# Patient Record
Sex: Female | Born: 1945
Health system: Southern US, Community
[De-identification: ages and names within clinical notes are randomized; demographics above are authoritative.]

## PROBLEM LIST (undated history)

## (undated) DIAGNOSIS — M8580 Other specified disorders of bone density and structure, unspecified site: Secondary | ICD-10-CM

## (undated) DIAGNOSIS — R112 Nausea with vomiting, unspecified: Secondary | ICD-10-CM

## (undated) DIAGNOSIS — C801 Malignant (primary) neoplasm, unspecified: Secondary | ICD-10-CM

## (undated) DIAGNOSIS — Z9889 Other specified postprocedural states: Secondary | ICD-10-CM

## (undated) DIAGNOSIS — IMO0002 Reserved for concepts with insufficient information to code with codable children: Secondary | ICD-10-CM

## (undated) DIAGNOSIS — Z9289 Personal history of other medical treatment: Secondary | ICD-10-CM

## (undated) DIAGNOSIS — R600 Localized edema: Secondary | ICD-10-CM

## (undated) DIAGNOSIS — D649 Anemia, unspecified: Secondary | ICD-10-CM

## (undated) DIAGNOSIS — T8859XA Other complications of anesthesia, initial encounter: Secondary | ICD-10-CM

## (undated) DIAGNOSIS — Z8489 Family history of other specified conditions: Secondary | ICD-10-CM

## (undated) DIAGNOSIS — R51 Headache: Secondary | ICD-10-CM

## (undated) DIAGNOSIS — Z460 Encounter for fitting and adjustment of spectacles and contact lenses: Secondary | ICD-10-CM

## (undated) DIAGNOSIS — M359 Systemic involvement of connective tissue, unspecified: Secondary | ICD-10-CM

## (undated) DIAGNOSIS — T4145XA Adverse effect of unspecified anesthetic, initial encounter: Secondary | ICD-10-CM

## (undated) DIAGNOSIS — G629 Polyneuropathy, unspecified: Secondary | ICD-10-CM

## (undated) DIAGNOSIS — E039 Hypothyroidism, unspecified: Secondary | ICD-10-CM

## (undated) DIAGNOSIS — I890 Lymphedema, not elsewhere classified: Secondary | ICD-10-CM

## (undated) DIAGNOSIS — M1990 Unspecified osteoarthritis, unspecified site: Secondary | ICD-10-CM

## (undated) HISTORY — DX: Systemic involvement of connective tissue, unspecified: M35.9

## (undated) HISTORY — DX: Lymphedema, not elsewhere classified: I89.0

## (undated) HISTORY — PX: BACK SURGERY: SHX140

## (undated) HISTORY — PX: OTHER SURGICAL HISTORY: SHX169

## (undated) HISTORY — PX: APPENDECTOMY: SHX54

## (undated) HISTORY — DX: Polyneuropathy, unspecified: G62.9

## (undated) HISTORY — PX: EYE SURGERY: SHX253

## (undated) HISTORY — PX: KNEE ARTHROSCOPY: SUR90

## (undated) HISTORY — PX: TUBAL LIGATION: SHX77

## (undated) HISTORY — DX: Malignant (primary) neoplasm, unspecified: C80.1

## (undated) HISTORY — PX: TONSILLECTOMY AND ADENOIDECTOMY: SUR1326

## (undated) HISTORY — PX: HAMMER TOE SURGERY: SHX385

## (undated) HISTORY — DX: Other specified disorders of bone density and structure, unspecified site: M85.80

## (undated) HISTORY — DX: Unspecified osteoarthritis, unspecified site: M19.90

## (undated) HISTORY — PX: TOTAL SHOULDER REPLACEMENT: SUR1217

## (undated) HISTORY — PX: TOTAL KNEE ARTHROPLASTY: SHX125

## (undated) HISTORY — DX: Reserved for concepts with insufficient information to code with codable children: IMO0002

---

## 1976-10-04 HISTORY — PX: BREAST ENHANCEMENT SURGERY: SHX7

## 1984-10-04 HISTORY — PX: THYROIDECTOMY: SHX17

## 1984-10-04 HISTORY — PX: VAGINAL HYSTERECTOMY: SUR661

## 1993-10-04 DIAGNOSIS — IMO0002 Reserved for concepts with insufficient information to code with codable children: Secondary | ICD-10-CM

## 1993-10-04 HISTORY — DX: Reserved for concepts with insufficient information to code with codable children: IMO0002

## 1998-07-11 ENCOUNTER — Ambulatory Visit (HOSPITAL_COMMUNITY): Admission: RE | Admit: 1998-07-11 | Discharge: 1998-07-11 | Payer: Self-pay | Admitting: Orthopedic Surgery

## 1998-07-11 ENCOUNTER — Encounter: Payer: Self-pay | Admitting: Orthopedic Surgery

## 1998-08-01 ENCOUNTER — Ambulatory Visit (HOSPITAL_BASED_OUTPATIENT_CLINIC_OR_DEPARTMENT_OTHER): Admission: RE | Admit: 1998-08-01 | Discharge: 1998-08-01 | Payer: Self-pay | Admitting: Orthopedic Surgery

## 1999-05-05 ENCOUNTER — Encounter: Payer: Self-pay | Admitting: Orthopedic Surgery

## 1999-05-12 ENCOUNTER — Inpatient Hospital Stay (HOSPITAL_COMMUNITY): Admission: RE | Admit: 1999-05-12 | Discharge: 1999-05-20 | Payer: Self-pay | Admitting: Orthopedic Surgery

## 1999-10-27 ENCOUNTER — Encounter: Payer: Self-pay | Admitting: Obstetrics and Gynecology

## 1999-10-27 ENCOUNTER — Encounter: Admission: RE | Admit: 1999-10-27 | Discharge: 1999-10-27 | Payer: Self-pay | Admitting: Obstetrics and Gynecology

## 2000-10-31 ENCOUNTER — Encounter: Admission: RE | Admit: 2000-10-31 | Discharge: 2000-10-31 | Payer: Self-pay | Admitting: Obstetrics and Gynecology

## 2000-10-31 ENCOUNTER — Encounter: Payer: Self-pay | Admitting: Obstetrics and Gynecology

## 2001-12-07 ENCOUNTER — Encounter: Admission: RE | Admit: 2001-12-07 | Discharge: 2001-12-07 | Payer: Self-pay | Admitting: Obstetrics and Gynecology

## 2001-12-07 ENCOUNTER — Encounter: Payer: Self-pay | Admitting: Obstetrics and Gynecology

## 2002-12-31 ENCOUNTER — Encounter: Payer: Self-pay | Admitting: Anesthesiology

## 2002-12-31 ENCOUNTER — Ambulatory Visit (HOSPITAL_COMMUNITY): Admission: RE | Admit: 2002-12-31 | Discharge: 2002-12-31 | Payer: Self-pay | Admitting: Anesthesiology

## 2003-02-15 ENCOUNTER — Encounter: Payer: Self-pay | Admitting: Neurological Surgery

## 2003-02-15 ENCOUNTER — Ambulatory Visit (HOSPITAL_COMMUNITY): Admission: RE | Admit: 2003-02-15 | Discharge: 2003-02-15 | Payer: Self-pay | Admitting: Neurological Surgery

## 2003-03-05 ENCOUNTER — Inpatient Hospital Stay (HOSPITAL_COMMUNITY): Admission: RE | Admit: 2003-03-05 | Discharge: 2003-03-08 | Payer: Self-pay | Admitting: Neurological Surgery

## 2003-03-05 ENCOUNTER — Encounter: Payer: Self-pay | Admitting: Neurological Surgery

## 2003-03-07 ENCOUNTER — Encounter: Payer: Self-pay | Admitting: Neurological Surgery

## 2003-05-01 ENCOUNTER — Encounter: Payer: Self-pay | Admitting: Obstetrics and Gynecology

## 2003-05-01 ENCOUNTER — Encounter: Admission: RE | Admit: 2003-05-01 | Discharge: 2003-05-01 | Payer: Self-pay | Admitting: Obstetrics and Gynecology

## 2003-10-21 ENCOUNTER — Ambulatory Visit (HOSPITAL_COMMUNITY): Admission: RE | Admit: 2003-10-21 | Discharge: 2003-10-21 | Payer: Self-pay | Admitting: Anesthesiology

## 2004-05-13 ENCOUNTER — Emergency Department (HOSPITAL_COMMUNITY): Admission: EM | Admit: 2004-05-13 | Discharge: 2004-05-13 | Payer: Self-pay | Admitting: Emergency Medicine

## 2004-05-22 ENCOUNTER — Encounter: Admission: RE | Admit: 2004-05-22 | Discharge: 2004-05-22 | Payer: Self-pay | Admitting: Obstetrics and Gynecology

## 2004-05-28 ENCOUNTER — Ambulatory Visit (HOSPITAL_COMMUNITY): Admission: RE | Admit: 2004-05-28 | Discharge: 2004-05-28 | Payer: Self-pay | Admitting: Anesthesiology

## 2004-12-22 ENCOUNTER — Emergency Department (HOSPITAL_COMMUNITY): Admission: EM | Admit: 2004-12-22 | Discharge: 2004-12-22 | Payer: Self-pay | Admitting: Emergency Medicine

## 2005-05-10 ENCOUNTER — Emergency Department: Payer: Self-pay | Admitting: Emergency Medicine

## 2005-07-28 ENCOUNTER — Encounter: Admission: RE | Admit: 2005-07-28 | Discharge: 2005-07-28 | Payer: Self-pay | Admitting: Obstetrics and Gynecology

## 2005-11-17 ENCOUNTER — Emergency Department (HOSPITAL_COMMUNITY): Admission: EM | Admit: 2005-11-17 | Discharge: 2005-11-17 | Payer: Self-pay | Admitting: Emergency Medicine

## 2005-12-20 ENCOUNTER — Ambulatory Visit (HOSPITAL_COMMUNITY): Admission: RE | Admit: 2005-12-20 | Discharge: 2005-12-20 | Payer: Self-pay | Admitting: Neurological Surgery

## 2006-06-27 ENCOUNTER — Inpatient Hospital Stay (HOSPITAL_COMMUNITY): Admission: RE | Admit: 2006-06-27 | Discharge: 2006-07-05 | Payer: Self-pay | Admitting: Neurological Surgery

## 2006-07-01 ENCOUNTER — Ambulatory Visit: Payer: Self-pay | Admitting: Physical Medicine & Rehabilitation

## 2006-11-02 ENCOUNTER — Inpatient Hospital Stay (HOSPITAL_COMMUNITY): Admission: RE | Admit: 2006-11-02 | Discharge: 2006-11-09 | Payer: Self-pay | Admitting: Neurological Surgery

## 2006-11-02 ENCOUNTER — Encounter: Payer: Self-pay | Admitting: Neurological Surgery

## 2007-01-03 ENCOUNTER — Encounter: Payer: Self-pay | Admitting: Neurological Surgery

## 2007-02-02 ENCOUNTER — Encounter: Payer: Self-pay | Admitting: Neurological Surgery

## 2007-06-22 ENCOUNTER — Encounter: Admission: RE | Admit: 2007-06-22 | Discharge: 2007-06-22 | Payer: Self-pay | Admitting: Obstetrics and Gynecology

## 2007-08-07 ENCOUNTER — Ambulatory Visit (HOSPITAL_COMMUNITY): Admission: RE | Admit: 2007-08-07 | Discharge: 2007-08-07 | Payer: Self-pay | Admitting: Anesthesiology

## 2007-10-02 ENCOUNTER — Ambulatory Visit: Payer: Self-pay | Admitting: Gastroenterology

## 2007-11-17 ENCOUNTER — Ambulatory Visit (HOSPITAL_BASED_OUTPATIENT_CLINIC_OR_DEPARTMENT_OTHER): Admission: RE | Admit: 2007-11-17 | Discharge: 2007-11-17 | Payer: Self-pay | Admitting: Orthopedic Surgery

## 2008-04-14 ENCOUNTER — Encounter: Admission: RE | Admit: 2008-04-14 | Discharge: 2008-04-14 | Payer: Self-pay | Admitting: Orthopaedic Surgery

## 2008-07-26 ENCOUNTER — Encounter: Admission: RE | Admit: 2008-07-26 | Discharge: 2008-07-26 | Payer: Self-pay | Admitting: Orthopedic Surgery

## 2008-08-20 ENCOUNTER — Encounter (INDEPENDENT_AMBULATORY_CARE_PROVIDER_SITE_OTHER): Payer: Self-pay | Admitting: Orthopedic Surgery

## 2008-08-20 ENCOUNTER — Ambulatory Visit: Payer: Self-pay | Admitting: Pulmonary Disease

## 2008-08-20 ENCOUNTER — Inpatient Hospital Stay (HOSPITAL_COMMUNITY): Admission: RE | Admit: 2008-08-20 | Discharge: 2008-08-28 | Payer: Self-pay | Admitting: Orthopedic Surgery

## 2008-08-27 ENCOUNTER — Ambulatory Visit: Payer: Self-pay | Admitting: Physical Medicine & Rehabilitation

## 2008-08-28 ENCOUNTER — Inpatient Hospital Stay (HOSPITAL_COMMUNITY)
Admission: RE | Admit: 2008-08-28 | Discharge: 2008-09-04 | Payer: Self-pay | Admitting: Physical Medicine & Rehabilitation

## 2008-11-21 ENCOUNTER — Encounter: Admission: RE | Admit: 2008-11-21 | Discharge: 2008-11-21 | Payer: Self-pay | Admitting: Obstetrics and Gynecology

## 2008-11-27 ENCOUNTER — Encounter: Admission: RE | Admit: 2008-11-27 | Discharge: 2008-11-27 | Payer: Self-pay | Admitting: Obstetrics and Gynecology

## 2009-08-12 ENCOUNTER — Encounter: Admission: RE | Admit: 2009-08-12 | Discharge: 2009-08-12 | Payer: Self-pay | Admitting: Orthopedic Surgery

## 2009-09-23 ENCOUNTER — Ambulatory Visit (HOSPITAL_COMMUNITY): Admission: RE | Admit: 2009-09-23 | Discharge: 2009-09-23 | Payer: Self-pay | Admitting: Orthopedic Surgery

## 2009-10-01 ENCOUNTER — Inpatient Hospital Stay (HOSPITAL_COMMUNITY): Admission: AD | Admit: 2009-10-01 | Discharge: 2009-10-03 | Payer: Self-pay | Admitting: Orthopedic Surgery

## 2009-10-04 HISTORY — PX: SPINAL CORD STIMULATOR IMPLANT: SHX2422

## 2010-01-01 ENCOUNTER — Encounter
Admission: RE | Admit: 2010-01-01 | Discharge: 2010-01-01 | Payer: Self-pay | Source: Home / Self Care | Admitting: Obstetrics and Gynecology

## 2010-10-15 ENCOUNTER — Ambulatory Visit (HOSPITAL_COMMUNITY)
Admission: RE | Admit: 2010-10-15 | Discharge: 2010-10-15 | Payer: Self-pay | Source: Home / Self Care | Attending: Orthopedic Surgery | Admitting: Orthopedic Surgery

## 2010-10-25 ENCOUNTER — Encounter: Payer: Self-pay | Admitting: Orthopedic Surgery

## 2010-10-25 ENCOUNTER — Encounter: Payer: Self-pay | Admitting: Neurological Surgery

## 2010-11-01 LAB — HM COLONOSCOPY: HM Colonoscopy: NORMAL

## 2011-01-04 LAB — DIFFERENTIAL
Basophils Absolute: 0 10*3/uL (ref 0.0–0.1)
Basophils Absolute: 0 10*3/uL (ref 0.0–0.1)
Basophils Relative: 0 % (ref 0–1)
Basophils Relative: 1 % (ref 0–1)
Eosinophils Absolute: 0 10*3/uL (ref 0.0–0.7)
Eosinophils Absolute: 0.1 10*3/uL (ref 0.0–0.7)
Eosinophils Relative: 1 % (ref 0–5)
Eosinophils Relative: 1 % (ref 0–5)
Lymphocytes Relative: 24 % (ref 12–46)
Lymphocytes Relative: 25 % (ref 12–46)
Lymphs Abs: 1.1 10*3/uL (ref 0.7–4.0)
Lymphs Abs: 1.1 10*3/uL (ref 0.7–4.0)
Monocytes Absolute: 0.3 10*3/uL (ref 0.1–1.0)
Monocytes Absolute: 0.3 10*3/uL (ref 0.1–1.0)
Monocytes Relative: 6 % (ref 3–12)
Monocytes Relative: 8 % (ref 3–12)
Neutro Abs: 2.9 10*3/uL (ref 1.7–7.7)
Neutro Abs: 3 10*3/uL (ref 1.7–7.7)
Neutrophils Relative %: 66 % (ref 43–77)
Neutrophils Relative %: 68 % (ref 43–77)

## 2011-01-04 LAB — URINALYSIS, ROUTINE W REFLEX MICROSCOPIC
Bilirubin Urine: NEGATIVE
Bilirubin Urine: NEGATIVE
Glucose, UA: NEGATIVE mg/dL
Glucose, UA: NEGATIVE mg/dL
Hgb urine dipstick: NEGATIVE
Hgb urine dipstick: NEGATIVE
Ketones, ur: NEGATIVE mg/dL
Ketones, ur: NEGATIVE mg/dL
Nitrite: NEGATIVE
Nitrite: NEGATIVE
Protein, ur: NEGATIVE mg/dL
Protein, ur: NEGATIVE mg/dL
Specific Gravity, Urine: 1.018 (ref 1.005–1.030)
Specific Gravity, Urine: 1.019 (ref 1.005–1.030)
Urobilinogen, UA: 0.2 mg/dL (ref 0.0–1.0)
Urobilinogen, UA: 0.2 mg/dL (ref 0.0–1.0)
pH: 5.5 (ref 5.0–8.0)
pH: 6 (ref 5.0–8.0)

## 2011-01-04 LAB — COMPREHENSIVE METABOLIC PANEL
ALT: 15 U/L (ref 0–35)
ALT: 19 U/L (ref 0–35)
AST: 23 U/L (ref 0–37)
AST: 28 U/L (ref 0–37)
Albumin: 3.7 g/dL (ref 3.5–5.2)
Albumin: 4.2 g/dL (ref 3.5–5.2)
Alkaline Phosphatase: 57 U/L (ref 39–117)
Alkaline Phosphatase: 58 U/L (ref 39–117)
BUN: 11 mg/dL (ref 6–23)
BUN: 14 mg/dL (ref 6–23)
CO2: 29 mEq/L (ref 19–32)
CO2: 30 mEq/L (ref 19–32)
Calcium: 9.2 mg/dL (ref 8.4–10.5)
Calcium: 9.3 mg/dL (ref 8.4–10.5)
Chloride: 100 mEq/L (ref 96–112)
Chloride: 98 mEq/L (ref 96–112)
Creatinine, Ser: 0.6 mg/dL (ref 0.4–1.2)
Creatinine, Ser: 0.67 mg/dL (ref 0.4–1.2)
GFR calc Af Amer: >60 mL/min (ref >60)
GFR calc Af Amer: >60 mL/min (ref >60)
GFR calc non Af Amer: >60 mL/min (ref >60)
GFR calc non Af Amer: >60 mL/min (ref >60)
Glucose, Bld: 91 mg/dL (ref 70–99)
Glucose, Bld: 92 mg/dL (ref 70–99)
Potassium: 3.6 mEq/L (ref 3.5–5.1)
Potassium: 3.7 mEq/L (ref 3.5–5.1)
Sodium: 137 mEq/L (ref 135–145)
Sodium: 137 mEq/L (ref 135–145)
Total Bilirubin: 0.7 mg/dL (ref 0.3–1.2)
Total Bilirubin: 0.8 mg/dL (ref 0.3–1.2)
Total Protein: 6.4 g/dL (ref 6.0–8.3)
Total Protein: 6.9 g/dL (ref 6.0–8.3)

## 2011-01-04 LAB — TYPE AND SCREEN
ABO/RH(D): B POS
ABO/RH(D): B POS
Antibody Screen: NEGATIVE
Antibody Screen: NEGATIVE

## 2011-01-04 LAB — CBC
HCT: 36.5 % (ref 36.0–46.0)
HCT: 38.6 % (ref 36.0–46.0)
Hemoglobin: 12.5 g/dL (ref 12.0–15.0)
Hemoglobin: 13.3 g/dL (ref 12.0–15.0)
MCHC: 34.3 g/dL (ref 30.0–36.0)
MCHC: 34.5 g/dL (ref 30.0–36.0)
MCV: 95.9 fL (ref 78.0–100.0)
MCV: 96.4 fL (ref 78.0–100.0)
Platelets: 162 10*3/uL (ref 150–400)
Platelets: 186 10*3/uL (ref 150–400)
RBC: 3.78 MIL/uL — ABNORMAL LOW (ref 3.87–5.11)
RBC: 4.03 MIL/uL (ref 3.87–5.11)
RDW: 12.6 % (ref 11.5–15.5)
RDW: 13.1 % (ref 11.5–15.5)
WBC: 4.3 10*3/uL (ref 4.0–10.5)
WBC: 4.5 10*3/uL (ref 4.0–10.5)

## 2011-01-04 LAB — APTT
aPTT: 32 seconds (ref 24–37)
aPTT: 33 seconds (ref 24–37)

## 2011-01-04 LAB — PROTIME-INR
INR: 0.98 (ref 0.00–1.49)
INR: 1.05 (ref 0.00–1.49)
Prothrombin Time: 12.9 seconds (ref 11.6–15.2)
Prothrombin Time: 13.6 seconds (ref 11.6–15.2)

## 2011-01-13 ENCOUNTER — Other Ambulatory Visit: Payer: Self-pay | Admitting: Obstetrics and Gynecology

## 2011-01-13 DIAGNOSIS — Z1231 Encounter for screening mammogram for malignant neoplasm of breast: Secondary | ICD-10-CM

## 2011-01-28 ENCOUNTER — Ambulatory Visit
Admission: RE | Admit: 2011-01-28 | Discharge: 2011-01-28 | Disposition: A | Discharge status: Home / Self Care | Payer: MEDICARE | Source: Ambulatory Visit | Attending: Obstetrics and Gynecology | Admitting: Obstetrics and Gynecology

## 2011-01-28 DIAGNOSIS — Z1231 Encounter for screening mammogram for malignant neoplasm of breast: Secondary | ICD-10-CM

## 2011-01-29 ENCOUNTER — Other Ambulatory Visit: Payer: Self-pay | Admitting: Obstetrics and Gynecology

## 2011-01-29 DIAGNOSIS — R928 Other abnormal and inconclusive findings on diagnostic imaging of breast: Secondary | ICD-10-CM

## 2011-02-04 ENCOUNTER — Ambulatory Visit
Admission: RE | Admit: 2011-02-04 | Discharge: 2011-02-04 | Disposition: A | Discharge status: Home / Self Care | Payer: MEDICARE | Source: Ambulatory Visit | Attending: Obstetrics and Gynecology | Admitting: Obstetrics and Gynecology

## 2011-02-04 DIAGNOSIS — R928 Other abnormal and inconclusive findings on diagnostic imaging of breast: Secondary | ICD-10-CM

## 2011-02-16 NOTE — Op Note (Signed)
Sonya Cordova, Sonya Cordova NO.:  [id number]   MEDICAL RECORD NO.:  [id number]          PATIENT TYPE:  INP   LOCATION:  2303                         FACILITY:  [facility]   PHYSICIAN:  Nelda Severe, MD      DATE OF BIRTH:  [birth year]   DATE OF PROCEDURE:  08/20/2008  DATE OF DISCHARGE:                               OPERATIVE REPORT   SURGEON:  Nelda Severe, MD   ASSISTANT:  Lianne Cure, PA-C   PREOPERATIVE DIAGNOSES:  Thoracolumbar scoliosis, lumbar kyphosis (flat  back), status post multiple surgeries for fusion and laminectomies.   POSTOPERATIVE DIAGNOSES:  Thoracolumbar scoliosis, lumbar kyphosis (flat  back), status post multiple surgeries for fusion and laminectomies.   OPERATIVE PROCEDURES:  1. L3 pedicle subtraction osteotomy.  2. Bilateral L2 and L4 laminectomies (in addition to L3 laminectomy      included in osteotomy)  3. Posterior osteotomy fusion mass.  4. Revision laminectomy at L5-S1.  5. T10-11, T11-12, and T12-L1 posterior fusion, L2-L4 posterolateral      fusion.  6. Pedicle screw instrumentation, T10 through S1 with screws and rods.  7. Removal of previously placed thoracolumbar pedicle screws and rods.  8. Neurophysiologic monitoring.   DESCRIPTION OF PROCEDURE:  The patient was placed under general  endotracheal anesthesia.  A gram of Ancef was administered intravenously  for prophylaxis.  Sequential compression devices were placed on both  lower extremities.  Scalp, upper extremity, and lower extremity  electrodes were attached for neurophysiologic monitoring.  A Foley  catheter was placed in the bladder.   The patient was then positioned on a Jackson frame with the hips  maximally extended.  The upper extremities were carefully positioned so  as to avoid hyperflexion and abduction of the shoulders and so as to  avoid hyperflexion of the elbows.  We had to place an extra thickness  foam on the chest rest to elevate the chest such that  the restricted  range of motion of the right upper extremity at the shoulder could be  accommodated insofar as ability to place her on the arm board without  stressing the shoulder is confirmed.   The previous midline incision was outlined with a skin marker.  The  thoracolumbar area was prepped with DuraPrep and draped in rectangular  fashion, and the drapes were secured with Ioban.   The previous thoracolumbar incision was excised elliptically, initially  just scoring the skin with a scalpel, followed by injection of  subcutaneous tissue with a mixture of 0.25% plain Marcaine and 1%  lidocaine with epinephrine.  We then excised the ellipse of skin.  Dissection was carried down onto the spinous processes of the upper  lumbar and lower thoracic spine, and the upper screws identified  bilaterally.  The instrumentation was then exposed bilaterally.  The  screws were uncoupled from the rods and the rods removed.  Initially, we  left the screws in place to mark the pedicle holes.  We then exposed the  lamina and transverse process of a vertebra proximal for the thoracic  fusion, which was T10.  This was done because we  were able to detect  motion at the T11-12 interval and actually I began to remove the screws  and the proximal screws at T11 somewhat loose.  However, they were left  in place for the time being.   Once we removed the screws, we further exposed the distal lumbar spine,  which was quite arduous because of dense scar.  Ultimately, we used a C-  arm fluoroscopy unit to identify the previously made pedicle holes at  L4, L5, and S1 bilaterally.  These holes were re-drilled and once we  have done that the screws were placed bilaterally at L4, L5, and on the  right side at S1.  Unfortunately at the left side S1, we were not able  to get any significant amount of bone purchase.  These were screw holes  which had previously been made, and screws previously explanted at  another  surgery.  Therefore, we were finding preexisting holes via  fluoroscopic guidance and with the need to remove some of the posterior  bone mass in order to find the holes.  These holes as noted were filled  with screws, after we had placed radiopaque markers and checked the  position with the fluoroscopy unit.  We also checked the position of  pedicles at T10 and made pedicle holes with AP fluoroscopic guidance.  Markers were placed.   Next, we placed pedicle screws bilaterally at T10 and then at the levels  at which the previous screws had been placed basically from T11, T12,  and L2 bilaterally.  The L1 pedicles had not been used to place screws  previously, presumably because the pedicles were extremely small in  diameter.   Therefore, at a certain point, we now had all the pedicle screws,  proximally and distally, for fixation.  We then began the process of  creating a wide laminectomy from the pedicle of L2 above to the pedicle  of L4 below.  The posterior lateral fusion mass at the intervening L3  level was osteotomized and removed.  The pedicles were removed  bilaterally.  There was a great deal of epidural bleeding and bone  bleeding, which was fairly continuous.  We controlled as much epidural  bleeding as possible using bipolar coagulation.  We then used an  osteotome to resect the remaining pedicle and upper endplate at L3  bilaterally.  The posterior vertebral body cortex was also removed,  using a combination of osteotome and rongeurs.  This was done starting  on the left and then moving to the right.  The dura was extremely thin  anteriorly and on the left, we noted an arachnoid bubble.  On the right,  there was an area where the dura was very thin and underlying __________  could be visualized, there was no spinal fluid leak at either point.   Finally, after we had removed satisfactory amount of bone posteriorly  including both the pedicles (the pedicle subtraction  osteotomy) the  lateral fluoroscopic view was taken and an osteotome was used to  penetrate anteriorly to a point a few millimeters from the anterior  cortex of the L3 vertebra.  With the fluoroscopy unit in place, the  pedicle screwdrivers were attached above and below the osteotomy and the  deformity (the lumbar flat back) was easily corrected.  The fluoroscopy  unit was then removed.  At this point, we began the process of rod  contouring reduction of the osteotomy as well as extending the lumbar  spine out of  the kyphotic deformity, we attempted to perform some  coronal plane correction to it.  The patient in a standing position was  decompensated to the right side.  Ultimately, we attached a 5.5 titanium  rod to the distal and proximal with the deformity satisfactorily  corrected inducing approximately 40 degrees of lumbar lordosis.  AP and  lateral portable radiographs were taken.  We took a second AP radiograph  positioned somewhat more proximally to try to judge the coronal plane  balance.  It was impossible to know for certain whether we had  adequately reduced the coronal plane deformity or in fact whether we had  slightly overcorrected it.  We were unable to get one x-ray cassette on  AP view from sacrum to T2 or T1.   During the process of laminectomy, we harvested bone as well as during  the process of osteotomy, we resected pieces of vertebral body, which  were harvested for bone graft.  Also, not mentioned above, is the fact  that I had to perform bilaterally exploration laminectomy at L5-S1 in  order to identify the pedicles of L5 and S1 and to help locate the S1  pedicles bilaterally and the left L5 pedicle, all of which were very  difficult to identify with the fluoroscopy views, although the  subsequent fluoroscopy views after we drilled the holes and placed  markers look satisfactory.   The bone graft was run through a mill and then mixed with 2 g of  vancomycin  powder.   Next, we decorticated the lamina at T10-T11, T11-T12, and T12-L1.  It  should be noted that there was not solid fusion at the T11-12 and T12-L1  levels from the previous surgery.  There was residual beta-tricalcium  phosphate particles remaining there and motion had been detected prior  to attaching the screws to rods.  Therefore, these laminae were  decorticated, and the facet joint denuded of articular cartilage.  We  then packed bone graft bilaterally at the T10-11, T11-12, and T12-L1  levels.   On the left side at L2, we decorticated the transverse process and the  fusion mass posterolaterally at the L4 level.  This interval was packed  with bone graft as well.  The bone was well coapted at the osteotomy  site, particularly on the left side.   All the couplings were then torqued.  Prior to placing the graft, we had  irrigated with antibiotic solution and the wound was numerous times  irrigated with antibiotic solution throughout the procedure, and the  solution sucked into a regular suction to avoid aspirating into the Cell  Saver.  We then closed the thoracolumbar fascia with numerous figure-of-  eight #1 Vicryl sutures in interrupted fashion.  A 1/8 inch Hemovac  drain was then placed in the subcutaneous layer and the subcutaneous  layer was closed using inverted 2-0 Vicryl sutures in interrupted  fashion.  The skin was closed using subcuticular 3-0 undyed Vicryl in  running fashion.  The skin edges were reinforced with Steri-Strips.  A 2-  0 nylon suture was used to secure a Hemovac drain, which was brought out  through the skin to the right side distally.  A nonadherent antibiotic  ointment dressing was applied and secured with OpSite.   There was a very significant blood loss throughout the procedure as  anticipated.  The final blood loss estimated to be between 7 and 8 L.  The patient received 4 units of fresh frozen plasma fairly early  in the  case, somewhere  around 1500 mL and 2 L of blood loss, later received  more fresh frozen plasma and platelet concentrate.  At no point, did she  appear to be having problems with the coagulopathy, and once the  osteotomy was closed, the bleeding did significantly reduce.   The patient's status insofar as a neurophysiologic monitoring was stable  throughout the case.  There was at one point when SSEPs of the upper  extremities were somewhat reduced and the circulating nurse under the  direction of the anesthetist taking arms of the arm boards and put the  shoulders to range of motion, which seemed to result and return of  normal readings.  Also, prior to attaching rods, every screw was  stimulated with electrical current and distal EMG activity recorded at  the time of stimulation.  In no instance was the current necessary to  stimulate the distal EMG activity at a low enough level to raise  concern.  In other words, based on the neurophysiologic testing, there  is little likelihood of any contact between the nerve root and the screw  thread.   Sponge and needle counts were correct.  The blood loss was as stated.  There were no intraoperative complications.   At the time of dictation, the patient has been transferred to the 2300  ICU, and I have not examined her yet, but will do so later.      Nelda Severe, MD  Electronically Signed     MT/MEDQ  D:  08/20/2008  T:  08/21/2008  Job:  [id number]

## 2011-02-16 NOTE — Op Note (Signed)
NAMEANGELIGUE, BOWNE             ACCOUNT NO.:  [id number]   MEDICAL RECORD NO.:  [id number]          PATIENT TYPE:  AMB   LOCATION:  NESC                         [facility]AC[facility]   PHYSICIAN:  Ollen Gross, M.D.    DATE OF BIRTH:  12/15/1945   DATE OF PROCEDURE:  11/17/2007  DATE OF DISCHARGE:  11/17/2007                               OPERATIVE REPORT   PREOPERATIVE DIAGNOSIS:  Left knee medial meniscal tear.   POSTOPERATIVE DIAGNOSIS:  Left knee medial meniscal tear.   PROCEDURE:  Left knee arthroscopy and meniscal debridement.   SURGEON:  Ollen Gross, M.D.   ASSISTANT:  None.   ANESTHESIA:  General.   ESTIMATED BLOOD LOSS:  Minimal.   DRAIN:  None.   COMPLICATIONS:  None.   CONDITION:  Stable to recovery.   CLINICAL NOTE:  Sonya Cordova is a 65 year old female with significant left  knee pain and mechanical symptoms.  Exam and history suggested medial  meniscal tear, confirmed by MRI.  She presents now for arthroscopy and  debridement.   PROCEDURE IN DETAIL:  After successful administration of general  anesthetic a tourniquet was placed high on her left thigh and her left  lower extremity was prepped and draped in the usual sterile fashion.  Standard superomedial and inferolateral incisions made and inflow  cannula passed superomedial, camera passed inferolateral.  Arthroscopic  visualization proceeds.  Undersurface of the patella had some grade I  and II chondromalacia as did the trochlea.  There are no focal chondral  defects.  Medial and lateral gutters were visualized.  There were no  loose bodies.  Flexion and valgus force was applied to the knee and the  medial compartment was entered.  She does have a tear in the posterior  horn of the medial meniscus.  The femoral condyle and tibial plateau  looked fine.  Spinal needle was used to localize the inferomedial portal  and small incision was made, dilator placed and I debrided the meniscus  back to a stable base  with baskets and a 4.2 mm shaver.  I sealed it off  with the ArthroCare device.  Intercondylar notch was visualized.  The  ACL was normal.  The lateral compartment was entered and it looks  normal.  I again addressed the patellofemoral compartment.  There was  some frayed cartilage on the trochlea which I stabilized back to a  stable cartilaginous base.  The rest of the trochlea and patella looked  fine.  The arthroscopic equipment was then  removed from the inferior portals which were closed with interrupted 4-0  nylon.  Twenty mL of 0.25% Marcaine with epi was injected through the  inflow cannula and that is removed and that portal closed with nylon.  A  bulky sterile dressing was then applied and she was awakened and  transported to recovery in stable condition.      Ollen Gross, M.D.  Electronically Signed     FA/MEDQ  D:  11/17/2007  T:  11/19/2007  Job:  [id number]

## 2011-02-16 NOTE — Discharge Summary (Signed)
Sonya Cordova, Sonya Cordova             ACCOUNT NO.:  [id number]   MEDICAL RECORD NO.:  [id number]          PATIENT TYPE:  IPS   LOCATION:  4009                         FACILITY:  [facility]   PHYSICIAN:  Ranelle Oyster, M.D.DATE OF BIRTH:  August 25, 1946   DATE OF ADMISSION:  08/28/2008  DATE OF DISCHARGE:  09/04/2008                               DISCHARGE SUMMARY   DISCHARGE DIAGNOSES:  1. Thoracolumbar scoliosis with kyphosis requiring redo with hardware      removal and revision with fusion T10-L4.  2. Hypothyroidism.  3. Vitamin B deficiency.  4. Chronic pain with worsening of neuropathy.  5. Acute blood loss anemia.   HISTORY OF PRESENT ILLNESS:  Sonya Cordova is a 65 year old female with  history of DDD and DJD with multiple back and neck surgeries and  thoracolumbar scoliosis with kyphosis.  She required redo with revision  and fusion T10-L4 on August 20, 2008, by Dr. [name].  Perioperatively,  the patient required multiple units of packed red blood cells with last  H&H at 9.4 and 28.0.  She did require vent support through August 21, 2008, was extubated without difficulty.  Therapies initiated and  currently the patient limited by pain as well as complains about TLSO  discomfort.  Rehab was consulted for progressive therapies.   PAST MEDICAL HISTORY:  Significant for chronic pain, right shoulder  replacement x2, right total knee placement x2, cervical decompression  x3, T11-T3 arthrodesis in September 2007 with pseudoarthrodesis and redo  in January 2005, left knee scope, left radius fracture with weakness, T  and A, abdominoplasty, rhinoplasty, breast augmentation with repair,  right thyroidectomy, and hysterectomy.   ALLERGIES:  CLEOCIN, AUGMENTIN, and CODEINE.   FAMILY HISTORY:  Positive for CVA, coronary artery disease, and cancer.   SOCIAL HISTORY:  The patient is married, lives in 2-level home with 2  steps at entry.  Quit tobacco in 1989.  Does not use any alcohol.  Husband is currently unemployed and can assist past discharge.   FUNCTIONAL HISTORY:  The patient was independent, but reports limited  endurance and problems with mobility due to pain, needed assist with  ADLs.  Still drives short distances.   FUNCTIONAL STATUS:  The patient is max assist to don brace with OT.  She  requires min to guard assist for toileting, min to guard for transfers,  supervision  for ambulating 90 feet with rolling walker, requires cues  for posture and precautions.   PHYSICAL EXAMINATION:  GENERAL:  The patient is a well-nourished, well-  developed female lying in bed with reports of moderate distress.  HEENT:  Pupils equal, round, and reactive to light.  Nares patent.  Tongue midline.  Moist oral mucosa.  NECK:  Supple without JVD or lymphadenopathy.  CHEST:  Clear to auscultation bilaterally without wheezes, rales, or  rhonchi.  HEART:  Regular rate and rhythm without murmurs or gallops.  ABDOMEN:  Soft and nontender with positive bowel sounds.  SKIN:  Low back incision that is clean, dry, intact, and well  approximated with Steri-Strips.  Old scars on neck, shoulder, and right  knee.  EXTREMITIES:  Fair range of motion in right knee and right shoulder.  No  peripheral edema.  NEUROLOGIC:  Cranial nerves II-XII are intact.  Reflexes 1+ in lower  extremity, 1-2+ in upper extremity.  Sensation decreased in distal limbs  particularly on right L3 dermatome.  Strength is 4/5 in upper extremity.  Lower extremity strength is 2/5 proximally inhibited by pain to 3/5  distally.  Judgment is fair, although bit limited by pain and anxiety.  She is alert and oriented x3.  Memory is fair to good.  Mood is anxious  and flat.   HOSPITAL COURSE:  Sonya Cordova was admitted to rehab on August 28, 2008, for inpatient therapies to consist of PT and OT at least 3  hours 5 days a week.  Past admission, physiatrist, 24-hour rehab RN, and  therapy team have worked  together to provide customized collaborative  interdisciplinary care.  Weekly team conference was held to assess the  patient's progress, set goals as well as discuss barriers to discharge.  At the time of admission, the patient with multiple complaints regarding  pain as well as complaints regarding neuropathy with numbness and  tingling in bilateral lower extremities.  At the time of admission, the  patient's Duragesic patch was increased to 125 mcg per day.  MSIR was  also increased to 45 mg q.6 h. p.r.n. for breakthrough pain.  The  patient continued to have issues regarding pain management with some  issues regarding tolerance of a.m. therapies.  Lyrica was increased to  100 mg t.i.d. on August 30, 2008, and Pamelor 10 mg q.h.s. was  additionally added.  Celebrex was initially added as an anti-  inflammatory pain med.  The patient reported no problems with this in  the past.  However, on September 02, 2008, the patient felt this was  causing some GI discomfort.  She was changed over to Arthrotec due to  her symptomatology.  The patient did report some issues with dizziness  on September 02, 2008.  The patient's Lyrica was placed on hold.  She  continued to have dizziness on September 03, 2008, therefore Pamelor was  discontinued.  On September 04, 2008, the patient reports some improvement  in dizziness during her OT session.  She does continue to focus on  issues regarding neuropathy with numbness in right greater than left  lower extremity.  Lyrica is increased at the lower dose of 100 mg p.o.  b.i.d.  She does also report some continued issues with some dizziness.  She was noted to have a scopolamine patch behind her right ear and this  was discontinued.  The patient is also advised to spread out her pain  meds to help to see if these would help with some of her dizziness  symptoms.  She has been set up to follow up with Dr. [name] on September 05, 2008, and he can further adjust her  medicines as needed.   During her stay in rehab, the patient's vitals have been monitored on  b.i.d. basis.  Initially, blood pressures were noted to be low from high  90s-110s systolic, 60s-70s diastolic.  Orthostatic blood pressures were  checked due to her complaints of dizziness.  This showed some mild  increase in heart rate with blood pressure supine at 125/72 with heart  rate at 92.  Blood pressures while standing were 133/81 and heart rate  97.  The patient without much complaints of worsening of symptoms while  going from supine to standing.  At time of admission, the patient was  noted to be at supervision level for bed mobility.  She requires min  assist for donning her brace.  OT has been working with the patient with  focus on functional ambulation with TLSO, using reacher to grab items in  standing balance as well as endurance at sink level.  Family education  was completed with the patient's husband regarding donning and doffing  of brace as well as the need for supervision for mobility.  OT has also  educated husband about being with the patient for showers and he is  agreeable to this.  The patient has required encouragement as well as  reiteration of need for donning TLSO at edge of bed.  Physical therapy  has worked with the patient with mobility.  Rehab RN has been helping  with the pain management with premedicating the patient prior to therapy  to help her tolerate her therapy program.  They have also been working  on bowel and bladder with adjustment of laxatives to help with  constipation issues.  Safety has also been a focus with the patient.  At  the time of admission, the patient was noted to be at supervision level  for transfers with supervision to min assist for dynamic standing  balance activity close supervision for ambulation.  PT has been working  with the patient with gait training for strengthening and endurance as  well as challenging around obstacles  and side stepping for dynamic  standing balance.  The patient is currently at supervision for  ambulating 200 feet with rolling walker.  She is able to navigate 4  stairs with supervision, able to perform car transfers with min assist.  The patient and husband have been educated regarding providing min  assist for lower extremity for car transfers as well as providing  supervision for ambulation as well as stair navigation.  Husband is able  to provide supervision and has no questions or concerns about the amount  of assistance at discharge.  The patient will continue with further  followup home health PT/OT by [facility] past  discharge.  On September 04, 2008, the patient is discharged to home.   DISCHARGE MEDICATIONS:  1. Vitamin D 1000 units per day.  2. Synthroid 25 mcg a day.  3. Lunesta 3 mg q.h.s.  4. Os-Cal 500 mg t.i.d.  5. Senokot-S 2 p.o. q.h.s.  6. Duragesic patch 125 mcg patch q.72 h.  The patient to use 100 mcg      plus 25 mcg patch, both together to make 125 and change both of      these every 72 hours, 2 boxes of each RX.  7. Arthrotec 75 mg 1 p.o. b.i.d.  8. Robaxin 1000 mg p.o. q.6-8 h. p.r.n. spasms  9. MSIR 30 mg 1-1/2 p.o. q.6 h. p.r.n. breakthrough pain, #75 RX.  10.Methadone 5 mg q.6-8 h. p.r.n. pain.  11.Lyrica 100 mg p.o. b.i.d.  12.Estrace to be resumed at home dose.   DIET:  Regular.   WOUND CARE:  Keep area clean and dry.   ACTIVITY LEVEL:  24-hour supervision.  No alcohol.  No smoking.  No  driving.  Follow routine back precautions, wear when at the edge of the  bed or extending out of bed.   SPECIAL INSTRUCTIONS:  Do not use Zanaflex.  [facility] to  provide PT/OT.   FOLLOWUP:  The patient to follow  up with Dr. [name] for postop check next  day.  Follow up with Dr. Bethena Midget for routine check as well as check of TSH  levels in 2-3 weeks.  Follow up with Dr. Riley Kill as needed.       Greg Cutter, P.A.       Ranelle Oyster, M.D.  Electronically Signed    PP/MEDQ  D:  09/04/2008  T:  09/05/2008  Job:  [id number]   cc:   _______

## 2011-02-16 NOTE — H&P (Signed)
Sonya Cordova, Sonya Cordova             ACCOUNT NO.:  [id number]   MEDICAL RECORD NO.:  [id number]          PATIENT TYPE:  IPS   LOCATION:  4009                         FACILITY:  [facility]   PHYSICIAN:  Ranelle Oyster, M.D.DATE OF BIRTH:  [birth year]   DATE OF ADMISSION:  08/28/2008  DATE OF DISCHARGE:                              HISTORY & PHYSICAL   CHIEF COMPLAINT:  Back pain and leg pain.   HISTORY OF PRESENT ILLNESS:  This is a 65 year old white female with  history of degenerative disk disease and DJD of lumbar spine, status  post multiple back and neck surgery with thoracolumbar scoliosis and  kyphosis, requiring redo and revision with fusion of T10 to L4 by Dr.  [name].  This was done on August 20, 2008.  The patient has required  multiple units of packed red blood cells for perioperative blood loss.  She required vent support through August 21, 2008, for respiratory  failure.  The patient began therapy and has been extremely limited due  to pain and discomfort with a TLSO.  She continues to struggle with her  mobility and self-care.  After rehab consultation, we felt that she  could benefit from an inpatient admission to the rehab floor.   REVIEW OF SYSTEMS:  Notable for insomnia, anxiety, depression,  radiculopathy in the leg, pain from the mid incision down to the sacrum,  ongoing wound issues, incontinence with urgency, and chronic  constipation.  She had decreased sensation in both distal legs to a  certain extent as well.   PAST MEDICAL HISTORY:  1. Positive for chronic pain, managed by Dr. Thyra Breed.  2. Right shoulder replacement x2.  3. Right total knee replacement x2.  4. Cervical decompression x3 T11-L3 arthrodesis on September 2007 with      pseudoarthrosis and redo in January 2008.  5. Left knee scope, left radius fracture with ongoing weakness.  6. Hysterectomy.  7. Right thyroidectomy.  8. Breast augmentation with repair.  9. Rhinoplasty.  10.Abdominoplasty.  11.T&A.   FAMILY HISTORY:  Positive for stroke, CAD, and cancer.   SOCIAL HISTORY:  The patient is married, lives in 2-level house, 2 steps  to enter.  She quit tobacco in 1989 and does not drink.  Husband  currently is unemployed and can provide some assistance at home.   FUNCTIONAL HISTORY:  The patient is independent, limited due to back  pain prior to arrival.  She needs some assistance with ADLs, but still  drove short distances.   HOME MEDICATIONS:  1. Duragesic patch 100 mcg q.72 h.  2. MSIR 30 mg q.6 h. p.r.n.  3. Methadone 5 mg q.6 h. p.r.n.  4. Lunesta 3 mg q.p.m.  5. MiraLax p.o. daily.  6. Lidoderm patch p.r.n.  7. Estrace 2 mg daily.  8. Synthroid 25 mcg daily.  9. Arthrotec.  10.Zanaflex q.8 h. 6 mg with vitamin D supplement.   LABORATORIES:  Hemoglobin 9.4, white count 5.3, and platelets 211.  Sodium 136, potassium 3.1, BUN 3, creatinine 0.6.  Chest x-ray on  August 24, 2008, revealed developing right lower lobe airspace  disease,  typical for atelectasis.  No evident pneumonia as of yet.   PHYSICAL EXAMINATION:  VITAL SIGNS:  Blood pressure is 105/57, pulse 84,  respiratory rate 18, and temperature 99.3.  GENERAL:  The patient is side lying in bed, in moderate distress.  HEENT:  Pupils are equal, round, and reactive to light.  Ear, Nose,  throat exam is unremarkable.  NECK:  Supple without JVD or lymphadenopathy.  CHEST:  Clear to auscultation bilaterally without wheezes, rales, or  rhonchi.  HEART:  Regular rate and rhythm without murmur, rubs, or gallops.  ABDOMEN:  Soft and nontender.  Bowel sounds are positive.  SKIN:  Notable for the low back incision which is clean, dry, and  intact, and well approximated with Steri-Strips.  Dressings in place.  She has old scars of the shoulder and neck, and right knee most  prominently.  She has fair range of motion through right knee and  shoulder, however.  NEUROLOGIC:  Cranial nerves II  through XII are  intact.  Reflexes are 1+ in the lower extremities, and 1+ to 2+ the  upper extremities.  Sensation decreased in the distal limbs,  particularly on the right L3 dermatome.  Strength is 4/5 in the upper  extremities.  Lower extremity strength is 2/5 proximally inhibited by  pain to 3/5 distally today.  Judgment was fair, although little bit  limited by pain and anxiety today.  She is alert and oriented x3,  otherwise.  Memory was fair to good.  Mood was anxious and flat.   POSTADMISSION PHYSICIAN ASSESSMENT AND PLAN:  Functional deficits  secondary to thoracolumbar scoliosis with kyphosis and redo with  hardware removal and revision and fusion of T10 through L4.  The patient  is postoperative day #8 today.  The patient is admitted to Inpatient  Rehab Unit today to receive collaborative interdisciplinary care between  the physiatrist rehab nursing staff and therapy team.  The patient's  level of medical complexity and substantial therapy needs in context of  that medical necessity cannot be provided at a lesser intensity of care.  Physiatrist will provide 24-hour management of medical needs as well as  oversight of the therapy plan/treatment and provide guidance as  appropriate regarding interaction of the 2.  The patient has had  substantial functional loss as a result of her chronic back problems and  is struggling as a result of her acute pain from the surgery.  Upon  rehab evaluation yesterday, the patient was min assist for transfers,  min assist for basic ambulation 30 feet using a rolling walker, although  needing significant rest breaks and stoppages due to pain.  She was  requiring max assistance for donning brace and min assist to guard  assist for toileting.  She needs frequent cues for posture as well with  her activities.  As of therapy today, there has not been a substantial  change.  It is likely however that with interdisciplinary rehab, the  patient can  achieve measurable gains that will be useful after her  discharge to home.  A 24-hour rehab nurse will assist in the management  of the patient's bowel and bladder continence, skin care needs, pain  management, appropriate nutrition, education, and integration of therapy  concepts.  PT will assess and treat for ambulation and gait in the  setting of her pain and tried to improve posture and overall technique  informed.  OT will assess and treat for upper extremity use, donning and  doffing of  TLSO, appropriate adaptive equipment, family education, etc.  Both therapies will ultimately be limited by her pain tolerance.  Case  Management/social worker will assess for psychosocial needs and  discharge planning.  Team conferences will be held weekly to establish  goals, assess progress, and to determine barriers to discharge.  The  patient will receive at least 3 hours of therapy per day at least 5 days  per week.  Rehab goals are modified, independent to supervision for  basic mobility and transfers.  ADLs will be min assist, independent  depending on tasks.  Donning and doffing of TLSO may still require min  assistance of her husband as well as some lower extremity ADLs.  Estimated length of stay is 7-10 days.  Prognosis good.   MEDICATION PROBLEM LIST AND PLAN:  1. Hypothyroid:  Continue Synthroid per home dose.  Will need a      followup thyroid function test for any fluctuation.  2. Neuropathic pain and postoperative pain:  Continue Lyrica 100 mg      b.i.d. which was just restarted.  Unlikely, we will need to      increase to t.i.d. schedule.  Also, we will increase her Duragesic      patch to 125 mcg q.72 h. as the 100 mcg is her home dosage at      baseline.  We can use methadone and morphine for breakthrough pain.      Watch closely for signs and symptoms of sedation.  Observe for      therapy tolerance as well.  3. Constipation:  Add Senokot-S in addition to a suppository and       observe for results.  Use enema as appropriate.  4. Vitamin D deficiency:  Resume vitamin D supplementation.  5. Deep vein thrombosis prophylaxis with SCD and TEDs.  I think she      can also benefit from subcu heparin until more mobile.  6. Mood:  Continue ego-supportive therapy.  May benefit periodically      from anxiolytic, but we will hold off for now.      Ranelle Oyster, M.D.  Electronically Signed     ZTS/MEDQ  D:  08/28/2008  T:  08/29/2008  Job:  [id number]

## 2011-02-19 NOTE — Discharge Summary (Signed)
Sonya Cordova, Sonya Cordova             ACCOUNT NO.:  [id number]   MEDICAL RECORD NO.:  [id number]          PATIENT TYPE:  INP   LOCATION:  3001                         FACILITY:  [facility]   PHYSICIAN:  Stefani Dama, M.D.  DATE OF BIRTH:  November 02, 1945   DATE OF ADMISSION:  11/02/2006  DATE OF DISCHARGE:  11/09/2006                               DISCHARGE SUMMARY   ADMITTING DIAGNOSIS:  Chronic intractable back pain status post T11-L3  arthrodesis with pseudoarthrosis status post L3 to sacrum arthrodesis  status post C6-C7 arthrodesis in June 2004.   DISCHARGE AND FINAL DIAGNOSES:  1. Intractable chronic back pain.  2. Status post revision surgery for T11-L3 arthrodesis with      pseudoarthrosis.  3. Fibromyalgia.  4. Depression.  5. Hypothyroidism.  6. Allergies to clindamycin.   CONDITION ON DISCHARGE:  Stable.   HOSPITAL COURSE:  The patient is a 65 year old individual who has had  significant spondylitic disease in lumbar spine and thoracic spine.  Having developed a thoracolumbar scoliosis, she was advised regarding  surgical decompression arthrodesis which was performed about a year ago.  She developed a pseudoarthrosis at T11, T12 and L1 areas and was advised  regarding revision.  She has had chronic intractable back pain and has  had significant use of narcotic analgesics.  She underwent surgical  revision 11/02/2006 and tolerated the surgery well.  However, because of  difficulties with managing her chronic pain, she was seen by Dr. Omer Jack [facility] to help narcotic pain management.  It was decided that she should be managed with fentanyl patch.  Morphine  was stopped and she was given hydromorphone.  This combination worked  well and the patient was gradually able to be weaned from pain  medication.  She was then restarted on methadone as the fentanyl patch  was weaned.  The patient tolerated this well and was gradually  ambulated, and on the  sixth hospital day was ready for discharge.  She  was sent home on February 6 on oral pain medications with reasonable  control.  She will be seen by Dr. Thyra Breed for further follow-up.  Condition on discharge is stable.      Stefani Dama, M.D.  Electronically Signed     HJE/MEDQ  D:  01/20/2007  T:  01/20/2007  Job:  [id number]

## 2011-02-19 NOTE — Consult Note (Signed)
NAMEKOREY, ARROYO             ACCOUNT NO.:  [id number]   MEDICAL RECORD NO.:  [id number]          PATIENT TYPE:  INP   LOCATION:  3001                         FACILITY:  [facility]   PHYSICIAN:  Juan-Carlos Monguilod, M.D.DATE OF BIRTH:  [birth year]   DATE OF CONSULTATION:  DATE OF DISCHARGE:                                 CONSULTATION   PROBLEM LIST:  1. Intractable chronic lower back pain.      a.     Status post T11-L3 fusion with re-do surgery today (history       of lumbar scoliosis/spondylosis).      b.     Status post L3 to sacrum arthrodesis in September 2007.      c.     Status post C-6-7 arthrodesis in June 2004, due to       pseudoarthrosis with cervical radiculopathy.  2. Fibromyalgia.  3. Depression.  4. Hypothyroidism.  5. ALLERGIES TO CLINDAMYCIN (SHORTNESS OF BREATH), PENICILLIN (RASH),      AND CODEINE.   RECOMMENDATIONS:  1. Hold methadone the until further notice.  The patient was taking      methadone 5 mg b.i.d. as an outpatient.  2. Continue fentanyl patch at 100 mcg an hour, patch every other day      has previously used.  3. Minimize the benzodiazepine use.  4. Change the hydromorphone PCA orders to a morphine PCA standing      orders using in the full dose.   The above recommendations were discussed and approved by Dr. Thyra Breed who has been managing the patient's back pain prior to this  admission.  Will continue following along with you and make further  adjustments on the patient's pain medications as needed.   IMPRESSION:  We were asked by Dr. Thyra Breed to see Ms. Tackett to  help manage her acute on chronic lower back pain.  Ms. Sonya Cordova is a  pleasant 65 year old female with multiple back surgeries as described  above, associated with chronic lower back pain.  Currently, the patient  is in the recovery room.  She is still is lethargic, due to the  anesthesia effect.  For this reason, the review of systems were somewhat  limited.   The patient has been on chronic opioids to control her lower  back pain.  Dr. Thyra Breed has been following this patient for pain  management.  The medication is being used as an outpatient prior to this  admission with the following:  Methadone 5 mg b.i.d., fentanyl 100-mcg  patch every other day and MSIR  30 mg every 4 hours p.r.n. for  breakthrough pain.  Currently, the patient is showing signs of pain by  facial and verbal expressions.  She is able to be aroused, and she  describes pain in her back when asked.  She was unable to rate the pain,  due to the slight decreased level of consciousness.  At this point, I  was unable to assess other characteristics of her pain.  In the past,  cervical radiculopathy, along with chronic lower back pain has been  described.  Methadone  was being used in an attempt perhaps of increasing  tolerance to the long-lasting opioid that was being used to treat her  lower back pain.  It is unclear if other neuropathic pain at the lower  back or legs were identified prior to this admission.   PAST MEDICAL HISTORY:  (Problem list).   ALLERGIES:  See Problem list.   MEDICATIONS:  See MAR  attached.   FAMILY MEDICAL HISTORY:  Noncontributory.   SOCIAL HISTORY:  Unobtainable, given the decreased level of  consciousness and the lack of data in the medical record.   REVIEW OF SYSTEMS:  As above.   PHYSICAL EXAM:  VITAL SIGNS:  Afebrile, heart rate 70, blood pressure  103/58, respiration rate 13, oxygen saturation 100% on face mask at 6%.  In HEENT:  Normocephalic, nontraumatic, nonicteric sclera, conjunctivae  within normal limits.  PERRLA, EOMI.  Funduscopic exam negative for  papilledema or hemorrhages.  TMs within normal limits.  Moist mucous  membranes.  Oropharynx clear.  NECK:  Supple, no JVD, no bruits, adenopathy or thyromegaly.  There was  a anterior surgical scar, well healed without evidence of infection.  LUNGS:  Clear to auscultation  bilaterally.  No crackles or wheezes.  No  rales.  Fair air movement bilaterally.  CARDIAC:  Regular rate and rhythm without murmurs, rubs or gallops.  Normal normal S1-S2.  ABDOMEN:  Nontender, nondistended, bowel sounds were decreased, no  rebound, guarding, no masses.  No bruits.  No hepatosplenomegaly.  GU:  Exam within normal limits.  RECTAL:  Exam not done.  This exam within normal limits.  BACK:  There is a dressing covering the surgical area with positive  drain, draining some  serosanguineous fluid. EXTREMITIES:  No edema,  clubbing or cyanosis.  Pulses 2+ bilaterally.  NEUROLOGIC:  Lethargic though arousable to verbal commands.  The patient  moves all extremities, and the strength seemed to be intact.  DTRs 3/5  in lower extremities.  Cranial nerves 2-12 for limited exam with sensory  intact.  Sensorial remains intact.  Plantar reflexes downgoing  bilaterally.   LABORATORY DATA:  Reviewed.   ASSESSMENT/PLAN:  1. Acute on chronic lower back pain - as described previously, the      patient has been on chronic opiates for her chronic lower back      pain.  Dr. Thyra Breed had been managing this patient as an      outpatient.  I held a conversation with Dr. Thyra Breed, in      regards to the treatment options.  We agree that, at this point,      only methadone will be recommended.  Will continue with fentanyl      patch at 100 mcg an hour every other day to provide with a steady      opioid therapy.  Will try to minimize benzodiazepines, to minimize      other side effects like respiratory suppression.  Initially,      hydromorphone PCA full dose had been proposed.  After the review of      medications and my conversation with Dr. Thyra Breed, we strongly      recommend to use morphine PCA, in stead the ordered the      hydromorphone PCA.  The reason for this recommendation is the fact      that the patient has been already and exposed to morphine, and she     has  responded fairly well as an outpatient.  Also, hydromorphone is      a much stronger opioid than morphine, tends to have an increased      risk for adverse reactions.  Regardless, will need close monitoring      of this patient, to minimize neurologic and respiratory side      effects.  Will continue following with you and give further      recommendations in regards adjustments of this medications, how to      go from IV to tablet forms for p.r.n. agents.   I spent about 120 minutes in this consultation.   Thank you for consulting the [facility], which is part  [facility].      Rosanne Sack, M.D.  Electronically Signed     JM/MEDQ  D:  11/03/2006  T:  11/03/2006  Job:  [id number]

## 2011-02-19 NOTE — Op Note (Signed)
NAMEEARLA, Sonya Cordova             ACCOUNT NO.:  [id number]   MEDICAL RECORD NO.:  [id number]          PATIENT TYPE:  INP   LOCATION:  3001                         FACILITY:  [facility]   PHYSICIAN:  Stefani Dama, M.D.  DATE OF BIRTH:  18-Oct-1945   DATE OF PROCEDURE:  11/03/2006  DATE OF DISCHARGE:                               OPERATIVE REPORT   PREOPERATIVE DIAGNOSIS:  Pseudoarthrosis T11-L3 status post arthrodesis  September 2007, loss of fixation.   POSTOPERATIVE DIAGNOSIS:  Pseudoarthrosis T11-L3 status post arthrodesis  September 2007, loss of fixation.   PROCEDURE:  Revision of arthrodesis T11-L3, removal of superior hook  hardware, replacement with pedicle fixation T11-T12, arthrodesis with  allograft and Infuse supplement.   SURGEON:  Stefani Dama, M.D.   FIRST ASSISTANT:  Hewitt Shorts, M.D.   ANESTHESIA:  General endotracheal.   INDICATIONS:  Sonya Cordova is a 65 year old individual who has  had significant problem with lumbar spondylosis in the past.  She has  had a fusion from L3 to the sacrum.  She underwent arthrodesis from T11-  L3 in September with pedicular screws at L2-L3 and hook hardware placed  in T11 with claw construct.  It was noted on a four-month follow-up that  she had lost fixation superiorly with fracture of the claw construct.  The patient was advised regarding the need for revision arthrodesis and  she is taken to the operating room at this time.  She underwent  myelography yesterday that demonstrated the patient has adequate  decompression of her nerve roots.   PROCEDURE:  The patient was brought to the operating room supine on the  stretcher.  After smooth induction of general endotracheal anesthesia,  she was turned prone.  The back was prepped with alcohol and then  DuraPrep and draped in sterile fashion.  The previous incision was  reopened in the midline and this was carried down through the lumbar and  thoracodorsal  fascia. Hardware was first exposed on the left side  superiorly.  The fractured hook fragment was identified and the hardware  was then exposed all the way down to the L3 screw and this was then done  on the opposite side, retracting laterally the soft tissues at the  hardware was uncovered.  The screw caps were then loosened at the  superior aspect and the pedicle screws were loosened inferiorly and the  rods were removed and the hooks were easily removed superiorly.  The  fractured superior claw hook was then removed from the underlying soft  tissues.  The hooks themselves appeared to have extruded themselves  through the lamina of the T11 vertebra.  The superior portion of the  hook itself was noted be fractured.  The fragments were removed and set  aside, each hook being kept separately to be identified.  With the  fractured component, the area was then explored radiographically and it  was felt that pedicle fixation could be performed at T11 and T12.  The  patient had a preoperative myelogram that demonstrated size of fairly  small pedicles at T11 and T12 areas.  Then by  using fluoroscopic  guidance, pedicle entry sites were chosen at T11.  This was first done  on the left side and then on the right side and 5.5 x 50 mm screws were  placed in T11.  At T12 the left-sided screw was easily placed and had  good purchase through the pedicle, however, on the right side there was  noted to be substantial medial cutout of the screw such that revising  the pedicle trajectory and placing the screw would not yield good  fixation.  Therefore, it was decided to leave the screw out of the T12  pedicle on the right side.  Once these were placed, the T11 laminar arch  and lateral intertransverse space was decorticated using a high-speed  bur and a 4.5 mm dissecting tool.  The portions of the hardware that  were left in place between L2 and L3 revealed that the solid arthrodesis  appeared to be  forming as there was no motion that could be instilled in  this area.  Just cephalad to the L2 vertebra it was uncertain that  arthrodesis was forming, but this area was decorticated amply to allow  placement of further allograft along with some Infuse strips.  During  the rest of the procedure, new rods were fashioned to fit between T11  and L3.  On the right side a straight rod was easily inserted into this  region.  On the left side the rod had to be slightly contoured to fit  between T11-T12, L2-L3 screws.  This was contoured and placed  appropriately.  Then the allograft was mixed with the long Tisseel  strips that were prepared and laid from T11-L3.  It should be noted that  bone marrow aspirate was also mixed with a aliquot of 30 mL of alpha  grain to fortify the arthrodesis, make sure this was laid in the  posterior lateral gutters.  Then radiographic confirmation of the  fixation was obtained in the AP projection using fluoroscopy.  The wound  was then closed with #1 Vicryl in the thoracodorsal fascia and a large  Hemovac drain was left in the paralumbar space that was grafted and then  2-0 Vicryl was used in the subcutaneous tissues and subcuticular tissues  and dry sterile dressing was applied to the skin.  Blood loss was  estimated at 750 mL.  The patient was returned to the recovery room in  stable condition.      Stefani Dama, M.D.  Electronically Signed     HJE/MEDQ  D:  11/03/2006  T:  11/03/2006  Job:  [id number]

## 2011-02-19 NOTE — Discharge Summary (Signed)
Sonya Cordova, Sonya Cordova             ACCOUNT NO.:  [id number]   MEDICAL RECORD NO.:  [id number]          PATIENT TYPE:  INP   LOCATION:  3039                         FACILITY:  [facility]   PHYSICIAN:  Stefani Dama, M.D.  DATE OF BIRTH:  [birth year]   DATE OF ADMISSION:  06/27/2006  DATE OF DISCHARGE:  07/05/2006                                 DISCHARGE SUMMARY   ADMITTING DIAGNOSES:  1. Lumbar scoliosis, lumbar spondylosis T11-L3, status post arthrodesis L3      to sacrum with implanted hardware.  2. Chronic intractable back pain.  3. Status post knee replacement and status post right shoulder      replacement.   DISCHARGE DIAGNOSES:  1. Lumbar scoliosis, lumbar spondylosis T11-L3, status post arthrodesis L3      to sacrum with implanted hardware.  2. Chronic intractable back pain.  3. Status post knee replacement and status post right shoulder      replacement.  4. History of fibromyalgia.   DISCHARGE MEDICATIONS:  1. Duragesic patch.  2. MSIR 30 mg q.4 h p.r.n. breakthrough pain.  3. Estrace.  4. Synthroid.  5. Trazodone.  6. Zanaflex.  7. Wellbutrin.  8. Arthrotec.  9. Calcium.  10.Vitamin D.  11.Methadone 5 mg b.i.d.  12.Valium 5 mg also given at time of discharge.   CONDITION ON DISCHARGE:  Stable.   HOSPITAL COURSE:  Sonya Cordova is a 65 year old individual was had  significant back and lower extremity pain, and she has noted history of  fibromyalgia and has developed scoliosis across the proximal lumbar  junction.  After careful consideration of her options, I advised regarding  surgical stabilization across the  thoracolumbar junction to prevent  progression of her scoliosis.  This was performed via implantation of  hardware with a foot construct of T11 and pedicle screws at L2-L3.  Stabilization procedure was performed with local autograft and allograft.  Postoperatively, initially pain control was difficult.  The patient was  given a PCA Dilaudid in  addition to routine medications, but she was rapidly  weaned from this and started on increased doses of MSIR.  She seemed to  tolerate this fairly well, and at time of discharge, the patient is using  some additional MSIR and has been on Valium 5 mg every 4-6 hours as needed  for muscle spasms.  She is given a prescription for MSIR 30 mg #60 without  refills in addition to Valium 5 mg #50 without refills.  She will be seen in  follow-up by Dr. Thyra Breed who does her routine prescribing of  medications.  In the addition,  I will see her in three weeks' time for  follow-up of her  thoracolumbar fusion.  At time of discharge, her incision is clean and dry.  She is ambulatory with the use of brace.  Her single biggest obstacle is  putting the brace on and off, which is difficult because of a frozen right  shoulder, and has had significant surgeries.      Stefani Dama, M.D.  Electronically Signed     HJE/MEDQ  D:  07/05/2006  T:  07/06/2006  Job:  [id number]

## 2011-02-19 NOTE — Discharge Summary (Signed)
   NAME:  Sonya Cordova, Sonya Cordova                       ACCOUNT NO.:  [id number]   MEDICAL RECORD NO.:  [id number]                   PATIENT TYPE:  INP   LOCATION:  3015                                 FACILITY:  [facility]   PHYSICIAN:  Stefani Dama, M.D.               DATE OF BIRTH:  1945-12-12   DATE OF ADMISSION:  03/05/2003  DATE OF DISCHARGE:  03/08/2003                                 DISCHARGE SUMMARY   ADMITTING DIAGNOSIS:  Pseudoarthrosis, C6-C7, with neck and cervical  radiculopathy.   DISCHARGE AND FINAL DIAGNOSIS:  Pseudoarthrosis, C6-C7, with neck and  cervical radiculopathy.   OPERATION:  Posterior supplemental arthrodesis of C6-C7 with Synthes plates  and screws and allograft on March 05, 2003.   CONDITION ON DISCHARGE:  Improving.   HOSPITAL COURSE:  [name] is a 65 year old individual who has had  significant difficulties with neck, shoulder and arm pain.  She underwent  the previous anterior cervical diskectomy some six or eight years ago.  She  was found to have a pseudoarthrosis at the level of C6-C7.  She has had  recurrent bouts of neck pain and shoulder pain with cervical radiculopathy  primarily in her left upper extremity.  Having failed conservative  management for the past number of months, she opted to undergo surgical  stabilization via a posterior approach; her previous surgery had been done  anteriorly.  This was performed on March 05, 2003.  Pain management was rather  a difficult problem for her during her postoperative period, as she has been  on significant medications including a Duragesic patch and MS Contin.  She  was gradually mobilized on the second and third postoperative days and at  this time, is ready for discharge on:   DISCHARGE MEDICATIONS:  1. Duragesic patch of 75 mcg.  2. MS Contin 30 mg up to four times a day.  3. Zanaflex as a muscle spasm medication.   CONDITION ON DISCHARGE:  Her incision is clean and dry and she is  neurologically intact.  Condition on discharge improving.   FOLLOWUP:  She will be seen in the office in two weeks' time for further  followup.                                                 Stefani Dama, M.D.    Merla Riches  D:  03/08/2003  T:  03/09/2003  Job:  [id number]

## 2011-02-19 NOTE — Op Note (Signed)
[name], [name]             ACCOUNT NO.:  [id number]   MEDICAL RECORD NO.:  [id number]          PATIENT TYPE:  INP   LOCATION:  3172                         FACILITY:  [facility]   PHYSICIAN:  Stefani Dama, M.D.  DATE OF BIRTH:  1945/10/26   DATE OF PROCEDURE:  06/27/2006  DATE OF DISCHARGE:                                 OPERATIVE REPORT   PREOPERATIVE DIAGNOSIS:  Spondylosis and scoliosis L2-3, status post  arthrodesis L3 to the sacrum.   POSTOPERATIVE DIAGNOSIS:  Spondylosis and scoliosis L2-3, status post  arthrodesis L3 to the sacrum.   OPERATION:  Arthrodesis T11-L3. with segmental fixation, removal of hardware  from L3-L4, local autograft and allograft arthrodesis.   SURGEON:  Stefani Dama, M.D.   FIRST ASSISTANT:  Cristi Loron, M.D.   ANESTHESIA:  General endotracheal.   INDICATIONS:  Sonya Cordova is a 65 year old individual who has had  significant problems with her back in the past. She had had an arthrodesis  from L3 down to the sacrum.  She developed scoliosis, with spondylitic  changes at the level of L2-L3, and she has a functional scoliosis across the  thoracolumbar junction.  She has been advised that she would require  surgical stabilization up to the T11 level across the thoracolumbar  junction.  She is now taken to the operating room for this procedure.   PROCEDURE:  The patient was brought to the operating room supine on the  stretcher. After smooth induction of general endotracheal anesthesia, she  was placed prone on table. The right arm was padded down by the patient's  side, as she has had previous shoulder arthroplasty surgery.  The left arm  was placed in a swimmer's position.  The bony prominences were carefully  padded and protected, and then the back was prepped with alcohol and then  DuraPrep and draped in a sterile fashion. An elliptical incision was made  around her previous scar, and this scar was excised.  The dissection  was  taken down to the thoracodorsal and lumbodorsal fascia on either side of  midline, and the spinous processes of L2 were identified, and the dissection  was then carried out laterally to expose the hardware which was easily  palpable, as the patient was quite thin with very little subcutaneous fat.  The dissection was taken down over the hardware, and the hardware was  identified as Mon[employer] plating system.  The screw caps were then gradually  exposed, and these were sequentially removed, first on the right side  removing the two screw caps and removing the plate, and then on the left  side. On the right side, the screws were then loosened after some modest  difficulty.  The screws could be removed sequentially. Once the hardware was  removed, the overgrown bone in the ligament that had developed in this area  was taken down. The arthrodesis itself was noted be solid at L3-L4, and it  seemed that in the interim there was the formation of a solid arthrodesis up  to the L2-L3 segment. The dissection was then taken up to expose the T11,  as  it was noted that the patient did have a significant scoliosis, and it was  felt that stabilization up to the T11 vertebrae would need to be undertaken  in order to provide some stability for her thoracolumbar junction.  The T11  lamina was then cleared  superiorly and inferiorly, the inferior placement  was provided for  distraction hooks , with a claw construct, and a singular  hook device was used to claw the laminar arch of the T11 vertebrae.  This  was first on the right side, and then on the left side, after clearing out  laminotomies.  Scientex hooks were used.  Then, pedicle screw entry sites  were chosen in the L2 vertebrae. This was done with radiographic guidance,  and screw placement was then placed by tapping with 6.5 mm tap, using 6.5 x  40 mm screws in the vertebrae of L2.  Once the screws were placed, care was  taken to make sure that  there was no cutout, and the holes he were easily  sounded. Fluoroscopic guidance was used to check their placement.  Then, 7.5  x 40 mm screws were placed in the L3 vertebrae, and by directing the heads  two straight rods could be placed between the T11 the L3 vertebrae to  include the L2 screws.  These were first placed provisionally, and after  adequate decortication of all the posterior spinal elements, the screw heads  were tightened down to the appropriate torque both at the T11 claw construct  and at the L3 pedicle screw, and also at the L2 pedicle screw. With the  posterolateral graft being placed then, and the spinous processes being  removed from T12-L2, graft was laid into these areas.  It should be noted  that a small laminotomy was created on the left side of the lamina at L2 to  check the placement of the L2 screw and to ascertain that there was indeed  no cutout in this area. With this then, the wound was closed over a large  Hemovac drain using #1 Vicryl, then 2-0 Vicryl was in the subcutaneous  tissues, 3-0 Vicryl subcuticularly.  A dry sterile dressing was placed on  the patient's back, and the drain was secured with a singular nylon stitch.  The patient was returned to the recovery room in stable condition.  Estimated blood loss was 500 cc; 200 cc of Cell Saver blood was returned to  the patient.      Stefani Dama, M.D.  Electronically Signed     HJE/MEDQ  D:  06/27/2006  T:  06/29/2006  Job:  [id number]

## 2011-02-19 NOTE — H&P (Signed)
Sonya Cordova, Sonya             ACCOUNT NO.:  [id number]   MEDICAL RECORD NO.:  [id number]          PATIENT TYPE:  INP   LOCATION:  2109                         FACILITY:  [facility]   PHYSICIAN:  Stefani Dama, M.D.  DATE OF BIRTH:  [birth year]   DATE OF ADMISSION:  06/27/2006  DATE OF DISCHARGE:                                HISTORY & PHYSICAL   ADMITTING DIAGNOSES:  1. Lumbar scoliosis, lumbar spondylosis T11 to L3, status post arthrodesis      L3 to the sacrum with implanted hardware.  2. Chronic intractable back pain.  3. Status post knee replacement and right shoulder replacement.   HISTORY OF PRESENT ILLNESS:  Sonya Sonya Cordova is a 65 year old individual  whom I first saw and treated back in 2004.  At that time she was have  shoulder and arm pain.  I evaluated her neck and noted that she had some  significant cervical spondylosis.  She had a previous arthrodesis at  multiple levels.  I again saw her in February of this year.  She fell onto  her buttock and her back and had a fracture of her sacrum at the level of  L3.  She has had a fusion from L4 to the sacrum in the past and then had L3-  L4 arthrodesed in 2000.  The patient healed from a sacral fracture; however,  she has had considerable problems with pain across the thoracolumbar  junction.  She was noted to have a significant scoliosis and ultimately  underwent a myelogram and a post-myelogram CT scan a few months ago.  The  patient has hardware at L4 with some suggestion of medial cutout of the  screws at the L4 segment.  The pedicles in general were rather small caliber  at L1 and L2 and with the angular scoliosis that was present accentuated by  the fixation at L3-L4, it was felt that ultimately if surgery was  contemplated she would need to undergo surgical stabilization from T11 to  L3.  She had some modest spondylytic changes at the L1-L2 level.  At L2-L3  she had advanced spondylytic changes but no significant  stenosis or  nerve  root compromise.  I discussed surgery with Sonya Sonya Cordova back in April, and I  suggested that ultimately she will need to undergo surgical decompression  with removal of the hardware from the L3-4 level.  She is now being admitted  for this procedure.   PAST MEDICAL HISTORY:  Notes that she has had significant neck surgery in  the past.  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  The patient was also seen and evaluated by Dr.  Casandra Doffing  in 2000, and she has had extensive surgery there including a  previous fusion from L4 to the sacrum and then a fusion from L3 to L4 more  recently.  She has significant history of degenerative arthritis and a  history of fibromyalgia.   CURRENT MEDICATIONS:  Duragesic patch, MSIR, Estrace, Synthroid, Trazodone,  Zanaflex, Wellbutrin, Arthrotec, calcium and vitamin D.  ALLERGIES:  SHE NOTES ALLERGIES TO CLEOCIN, AUGMENTIN AND CODEINE.  VIOXX  ALSO CAUSES HER STOMACH TO BE UPSET.   SOCIAL HISTORY:  She does not smoke.  She does not drink alcohol.  She has  had a 20-pound weight loss since her most recent surgery in 2000.   SYSTEMS REVIEW:  Notable for contacts, nasal congestion and drainage, leg  pain while walking.  History of a number of broken bones, arm weakness, leg  weakness, change in bowel habits and change in bladder habits with increased  constipation, difficulty with memory and ability to concentrate, depression,  thyroid disease, having had a thyroidectomy in 1985, nasal inhalant  allergies, were all noted and reviewed on the 14-point review sheet.   PHYSICAL EXAMINATION:  Reveals that she is alert and oriented individual in  no obvious distress.  Range of motion of her neck is limited to turning only  45 degrees to the left and to the right.  Extension and flexion are  approximately 50% of normal.  Motor strength in the upper extremities  reveals give-away weakness in the  biceps on the left.  The triceps, grips,  intrinsics are all intact.  No atrophy is noted.  Deep tendon reflexes are  2+ in the biceps, absent in left triceps, trace in the right triceps.  Deltoid on the left side appears to be slightly weak when compared to the  right side at 4+/5.  Sensation is diminished on the left compared to the  right to vibration.  General lumbar examination reveals that she has a well-  healed scar from the mid lumbar spine down to the region of the sacrum.  There is easily palpable hardware on the left side of her paraspinous lumbar  musculature.  This area does have some acute tenderness.  Palpation and  percussion also reproduces acute tenderness.  There is tenderness to  palpation across the thoracolumbar junction.  Motor strength in the lower  extremities is intact in the iliopsoas, quad, tibialis anterior and gastroc.  Deep tendon reflexes are 1+ in the patellae, trace in the Achilles.  Babinski's are downgoing.  Sensation is intact to vibration distally.  Cranial nerve examination is within the limits of normal.   GENERAL PHYSICAL EXAMINATION:  LUNGS:  Clear to auscultation.  HEART:  Has a regular rate and rhythm.  No murmur is heard.  ABDOMEN:  Soft.  Bowel sounds are positive.  No masses are palpable.  EXTREMITIES:  Reveal no cyanosis, clubbing or edema.   IMPRESSION:  The patient has evidence of spondylosis from T11 down to L3.  She is now being admitted to undergo surgical stabilization of these  segments with removal of hardware at L3-L4.      Stefani Dama, M.D.  Electronically Signed     HJE/MEDQ  D:  06/27/2006  T:  06/29/2006  Job:  [id number]

## 2011-02-19 NOTE — Discharge Summary (Signed)
Sonya Cordova, Sonya Cordova             ACCOUNT NO.:  [id number]   MEDICAL RECORD NO.:  [id number]          PATIENT TYPE:  INP   LOCATION:  5017                         FACILITY:  [facility]   PHYSICIAN:  Nelda Severe, MD      DATE OF BIRTH:  [birth year]   DATE OF ADMISSION:  08/20/2008  DATE OF DISCHARGE:  08/28/2008                               DISCHARGE SUMMARY   DIAGNOSES:  Status post multiple spine surgeries , lumbar kyphosis and  thoracolumbar scoliosis.   Post surgery, the patient was admitted to [facility] ICU,  status post significant amount of fluid shift.  Estimated blood loss was  8 liters total.  She received 8 units of FFP, albumin 500 mL, Hespan 1  liter, crystalloid 9400 mL, concentrated platelets 2 bags, 11 Cell Saver  units and 5 units of packed red blood cells.  We did consult critical  care physician to follow this patient.  She was sedated and maintained  on a ventilator.  On postop day #1, she was extubated, grossly  neurovascularly and motor intact.  Alert and oriented, significant  amount of pain.  Heart rate was stable at 87.  She was afebrile.  Vital  signs were stable.  Hemovac drain in place, minimal drainage at this  point.  Potassium was 3.0, and she was receiving potassium treatment  through the critical care doctors.  On postop day #2, the wound was  clean, dry, and intact.  Continuing current treatment with guidance of  critical care.  Continuing potassium replacement.  We did order a TLSO  brace to be fabricated.  They came for measurement on August 22, 2008.  On postop day #3, the patient continues to complain of lot of pain.  She  is on a PCA.  No shortness of breath.  No chest pain.  Afebrile.  Vital  signs stable.  Temperature maximum was 99.8.  Saturation of oxygen 95 to  96% on room air.  Hemoglobin 8.3, white count 7.6, and platelets count  72.  Drain output 15 mL.  She was neurovascularly and motor intact.  Drain was discontinued today and  incision clear, dry, and intact.  Dry  dressing was applied.  Pending arrival of the TLSO brace, she is going  to be transferred to 3300.  Physical therapy was ordered for mobility.  She may sit up without the brace on out of bed as tolerated at this  point.  Daily lab work to include CBC as well as BMET.  Orthotist did  deliver the brace on August 23, 2008, checked for proper fitting.  On  postop day #4, hemoglobin stable at 8.9, white count 6.8, and platelet  count 1100.  She has not yet stood secondary to pain.  She likes to lie  on one side versus the other, is complaining about lying on her back in  the supine position secondary to increased pain.  On postop day #5, no  change.  Anterior thigh pain has been reported on postop #6, we did  order a heating K-pad at the request of nursing.  On postop day#7,  the  patient doing some better and complains about brace hitting anterior  thighs when she sits.  We will ask the brace company to come and try to  do adjustments if possible.  She is afebrile.  Vital signs are stable.  We are going to ask for rehab consultation, which they did accept her  into rehab.  On August 27, 2008, she was transferred to the rehab  unit.   DIAGNOSES:  Lumbar kyphosis, scoliosis, revision with L3 osteotomy T10-  S1.   Fentanyl patch was also started 50 mcg q.72h.  Brace adjustment was made  prior to going to the rehab floor.  The transfer actually occurred on  August 28, 2008.   DISPOSITION:  Stable.   Reason for rehab decreased mobility, and ultimately she will follow up  with Dr. Nelda Severe in 4 weeks from surgery date.  She will return to  her pain management doctor for medications.  Diet is regular.      Lianne Cure, P.A.      Nelda Severe, MD  Electronically Signed    MC/MEDQ  D:  10/08/2008  T:  10/09/2008  Job:  [id number]

## 2011-02-19 NOTE — Op Note (Signed)
NAME:  Sonya Cordova, Sonya Cordova                       ACCOUNT NO.:  [id number]   MEDICAL RECORD NO.:  [id number]                   PATIENT TYPE:  INP   LOCATION:  2872                                 FACILITY:  [facility]   PHYSICIAN:  Stefani Dama, M.D.               DATE OF BIRTH:  12/26/1945   DATE OF PROCEDURE:  03/05/2003  DATE OF DISCHARGE:                                 OPERATIVE REPORT   PREOPERATIVE DIAGNOSES:  Pseudoarthrosis C6-C7, with cervical radiculopathy.   POSTOPERATIVE DIAGNOSES:  Pseudoarthrosis C6-C7, with cervical  radiculopathy.   OPERATION:  1. Posterior arthrodesis C6-C7, with local autograft and DBX bone matrix.  2. Fixation with lateral mass screws and Synthes plate.   SURGEON:  Stefani Dama, M.D.   ASSISTANT:  Cristi Loron, M.D.   ANESTHESIA:  General endotracheal.   INDICATIONS FOR PROCEDURE:  The patient is a 65 year old individual who has  had episodic neck pain for a number of years.  He had previously undergone  an anterior cervical diskectomy and arthrodesis with an A-line-type plate  six years ago, and every year or so she had a severe episode of neck pain  which was slow to resolve.  She was then seen and evaluated by Dr. Stasia Cavalier in the past and was advised that her arthrodesis appeared stable.  A  workup here recently revealed that she has had neck pain for the past six  months, which has been unrelenting and inconsolable, despite the strong use  of pain medications, including a Duragesic patch and MS Contin on a daily  basis.  Further workup demonstrated that she had a pseudoarthrosis at the C6-  C7 level.  Her foramen appeared to be widely patent at each of these levels;  however, because of the pseudoarthrosis, she was ultimately advised  regarding surgical stabilization of the neck via a posterior approach at the  C6-C7 level, and this is now being performed.   DESCRIPTION OF PROCEDURE:  The patient was brought to the  operating room  supine on the stretcher.  After the smooth induction of general endotracheal  anesthesia, she was placed in the three-pin headrest and then turned prone.  Care was taken to appropriately pad the prominences, both the soft tissue  and bony.  She then underwent a scrubbing and prepping of the back of the  neck.  A midline incision was then created and carried down to the cervical  dorsal fascia which was opened on either side of the midline to expose the  lower spinous  processes.  Radiographic confirmation of the C6-C7 area was  obtained positively with several radiographs, and the facet joints then were  noted to have some mobility posteriorly.  These were drilled open with the  high-speed air drill and 2.3 mm dissecting tool.  The entry sites were then  chosen on the posterior aspect of the facet capsules measuring approximately  1.0  mm just medial to the midline, and then angling a drill which was 2.2 mm  in diameter, 20 degrees medially and 20 degrees cephalad into the lateral  mass at C6 and at C7.  Then using the 14 mm standard size, 3.5 mm in  diameter Synthes screws, the areas were tapped and a standard size three-  hole Synthes plate was placed over this lateral mass.  The plate had the  screw holes angulated 20 degrees laterally.  This was affixed to this  posterior structure.  Prior to doing this, the drilled-away bone that had  been obtained from the drilling itself was packed into the facet recess and  then this was additionally packed with the DBX bone matrix.  A total of 5 mL  was used in both of the facets.  This procedure was carried out first on the  left-hand side and then on the right-hand side.  Screws were then tightened  down snugly.  A final localizing radiograph was identified in good position  on the posterior hardware.  The area was then checked for hemostasis.  Retractors were removed.  Then the cervical dorsal fascia was closed with #1  Vicryl in  an interrupted fashion, #2-0 Vicryl used in the subcutaneous  tissues, and #3-0 Vicryl subcuticularly.  The patient tolerated the procedure well and was returned to the recovery  room in stable condition.  The blood loss for the procedure was estimated at  500 mL.                                                Stefani Dama, M.D.    Merla Riches  D:  03/05/2003  T:  03/05/2003  Job:  [id number]

## 2011-06-25 LAB — POCT HEMOGLOBIN-HEMACUE
Hemoglobin: 9.7 — ABNORMAL LOW
Operator id: 280881

## 2011-07-06 LAB — CBC
HCT: 29.6 % — ABNORMAL LOW (ref 36.0–46.0)
HCT: 23.6 — ABNORMAL LOW
HCT: 25 — ABNORMAL LOW
HCT: 25.7 — ABNORMAL LOW
HCT: 26.1 — ABNORMAL LOW
HCT: 26.9 — ABNORMAL LOW
HCT: 27.3 — ABNORMAL LOW
HCT: 28 — ABNORMAL LOW
HCT: 35 — ABNORMAL LOW
HCT: 39.9
HCT: 41.2
Hemoglobin: 9.9 g/dL — ABNORMAL LOW (ref 12.0–15.0)
Hemoglobin: 12.1
Hemoglobin: 13.5
Hemoglobin: 13.8
Hemoglobin: 8.3 — ABNORMAL LOW
Hemoglobin: 8.7 — ABNORMAL LOW
Hemoglobin: 8.9 — ABNORMAL LOW
Hemoglobin: 8.9 — ABNORMAL LOW
Hemoglobin: 9.1 — ABNORMAL LOW
Hemoglobin: 9.4 — ABNORMAL LOW
Hemoglobin: 9.5 — ABNORMAL LOW
MCHC: 33.6 g/dL (ref 30.0–36.0)
MCHC: 33.5
MCHC: 33.6
MCHC: 33.8
MCHC: 33.9
MCHC: 34.2
MCHC: 34.5
MCHC: 34.7
MCHC: 34.8
MCHC: 34.8
MCHC: 35.2
MCV: 93.7 fL (ref 78.0–100.0)
MCV: 89.3
MCV: 90.3
MCV: 90.3
MCV: 90.7
MCV: 91.3
MCV: 91.5
MCV: 91.8
MCV: 92.7
MCV: 93
MCV: 94.5
Platelets: 354 10*3/uL (ref 150–400)
Platelets: 105 — ABNORMAL LOW
Platelets: 111 — ABNORMAL LOW
Platelets: 122 — ABNORMAL LOW
Platelets: 163
Platelets: 169
Platelets: 211
Platelets: 70 — ABNORMAL LOW
Platelets: 72 — ABNORMAL LOW
Platelets: 74 — ABNORMAL LOW
Platelets: 80 — ABNORMAL LOW
RBC: 3.16 MIL/uL — ABNORMAL LOW (ref 3.87–5.11)
RBC: 2.59 — ABNORMAL LOW
RBC: 2.8 — ABNORMAL LOW
RBC: 2.8 — ABNORMAL LOW
RBC: 2.86 — ABNORMAL LOW
RBC: 2.89 — ABNORMAL LOW
RBC: 3.02 — ABNORMAL LOW
RBC: 3.02 — ABNORMAL LOW
RBC: 3.88
RBC: 4.36
RBC: 4.4
RDW: 14.5 % (ref 11.5–15.5)
RDW: 13.2
RDW: 13.6
RDW: 13.8
RDW: 14.1
RDW: 14.1
RDW: 14.2
RDW: 14.4
RDW: 14.5
RDW: 14.7
RDW: 14.9
WBC: 6.1 10*3/uL (ref 4.0–10.5)
WBC: 10.6 — ABNORMAL HIGH
WBC: 5.3
WBC: 5.4
WBC: 6
WBC: 6.3
WBC: 6.8
WBC: 7.3
WBC: 7.6
WBC: 8.2
WBC: 9.4

## 2011-07-06 LAB — FIBRINOGEN: Fibrinogen: 157 — ABNORMAL LOW

## 2011-07-06 LAB — BASIC METABOLIC PANEL
BUN: 10
BUN: 3 — ABNORMAL LOW
BUN: 4 — ABNORMAL LOW
BUN: 4 — ABNORMAL LOW
BUN: 4 — ABNORMAL LOW
BUN: 7
CO2: 26
CO2: 26
CO2: 28
CO2: 30
CO2: 31
CO2: 33 — ABNORMAL HIGH
Calcium: 7 — ABNORMAL LOW
Calcium: 7.1 — ABNORMAL LOW
Calcium: 7.6 — ABNORMAL LOW
Calcium: 7.7 — ABNORMAL LOW
Calcium: 7.8 — ABNORMAL LOW
Calcium: 8.1 — ABNORMAL LOW
Chloride: 101
Chloride: 101
Chloride: 105
Chloride: 106
Chloride: 109
Chloride: 95 — ABNORMAL LOW
Creatinine, Ser: 0.37 — ABNORMAL LOW
Creatinine, Ser: 0.46
Creatinine, Ser: 0.5
Creatinine, Ser: 0.52
Creatinine, Ser: 0.6
Creatinine, Ser: 0.6
GFR calc Af Amer: >60
GFR calc Af Amer: >60
GFR calc Af Amer: >60
GFR calc Af Amer: >60
GFR calc Af Amer: >60
GFR calc Af Amer: >60
GFR calc non Af Amer: >60
GFR calc non Af Amer: >60
GFR calc non Af Amer: >60
GFR calc non Af Amer: >60
GFR calc non Af Amer: >60
GFR calc non Af Amer: >60
Glucose, Bld: 103 — ABNORMAL HIGH
Glucose, Bld: 105 — ABNORMAL HIGH
Glucose, Bld: 113 — ABNORMAL HIGH
Glucose, Bld: 144 — ABNORMAL HIGH
Glucose, Bld: 91
Glucose, Bld: 98
Potassium: 3 — ABNORMAL LOW
Potassium: 3.1 — ABNORMAL LOW
Potassium: 3.1 — ABNORMAL LOW
Potassium: 3.4 — ABNORMAL LOW
Potassium: 3.6
Potassium: 4.1
Sodium: 136
Sodium: 136
Sodium: 137
Sodium: 137
Sodium: 138
Sodium: 139

## 2011-07-06 LAB — CROSSMATCH
ABO/RH(D): B POS
Antibody Screen: NEGATIVE

## 2011-07-06 LAB — POCT I-STAT 7, (LYTES, BLD GAS, ICA,H+H)
Acid-Base Excess: 3 — ABNORMAL HIGH
Acid-Base Excess: 5 — ABNORMAL HIGH
Acid-Base Excess: 5 — ABNORMAL HIGH
Acid-Base Excess: 5 — ABNORMAL HIGH
Acid-Base Excess: 7 — ABNORMAL HIGH
Acid-Base Excess: 7 — ABNORMAL HIGH
Acid-Base Excess: 7 — ABNORMAL HIGH
Bicarbonate: 26.1 — ABNORMAL HIGH
Bicarbonate: 28.3 — ABNORMAL HIGH
Bicarbonate: 28.6 — ABNORMAL HIGH
Bicarbonate: 28.9 — ABNORMAL HIGH
Bicarbonate: 29.1 — ABNORMAL HIGH
Bicarbonate: 30.4 — ABNORMAL HIGH
Bicarbonate: 30.7 — ABNORMAL HIGH
Calcium, Ion: 0.88 — ABNORMAL LOW
Calcium, Ion: 0.89 — ABNORMAL LOW
Calcium, Ion: 0.89 — ABNORMAL LOW
Calcium, Ion: 0.9 — ABNORMAL LOW
Calcium, Ion: 0.95 — ABNORMAL LOW
Calcium, Ion: 1.06 — ABNORMAL LOW
Calcium, Ion: 1.12
HCT: 17 — ABNORMAL LOW
HCT: 17 — ABNORMAL LOW
HCT: 22 — ABNORMAL LOW
HCT: 30 — ABNORMAL LOW
HCT: 31 — ABNORMAL LOW
HCT: 32 — ABNORMAL LOW
HCT: 47 — ABNORMAL HIGH
Hemoglobin: 10.2 — ABNORMAL LOW
Hemoglobin: 10.5 — ABNORMAL LOW
Hemoglobin: 10.9 — ABNORMAL LOW
Hemoglobin: 16 — ABNORMAL HIGH
Hemoglobin: 5.8 — CL
Hemoglobin: 5.8 — CL
Hemoglobin: 7.5 — CL
O2 Saturation: 100
O2 Saturation: 100
O2 Saturation: 100
O2 Saturation: 100
O2 Saturation: 100
O2 Saturation: 100
O2 Saturation: 100
Patient temperature: 35.4
Patient temperature: 35.8
Patient temperature: 36.5
Patient temperature: 36.5
Patient temperature: 36.7
Patient temperature: 36.8
Patient temperature: 37.1
Potassium: 3 — ABNORMAL LOW
Potassium: 3 — ABNORMAL LOW
Potassium: 3.2 — ABNORMAL LOW
Potassium: 3.2 — ABNORMAL LOW
Potassium: 3.3 — ABNORMAL LOW
Potassium: 3.3 — ABNORMAL LOW
Potassium: 3.4 — ABNORMAL LOW
Sodium: 138
Sodium: 139
Sodium: 140
Sodium: 140
Sodium: 140
Sodium: 140
Sodium: 140
TCO2: 27
TCO2: 29
TCO2: 30
TCO2: 30
TCO2: 30
TCO2: 32
TCO2: 32
pCO2 arterial: 28.6 — ABNORMAL LOW
pCO2 arterial: 32.1 — ABNORMAL LOW
pCO2 arterial: 36.4
pCO2 arterial: 36.6
pCO2 arterial: 37.5
pCO2 arterial: 37.9
pCO2 arterial: 40.3
pH, Arterial: 7.462 — ABNORMAL HIGH
pH, Arterial: 7.491 — ABNORMAL HIGH
pH, Arterial: 7.494 — ABNORMAL HIGH
pH, Arterial: 7.514 — ABNORMAL HIGH
pH, Arterial: 7.528 — ABNORMAL HIGH
pH, Arterial: 7.529 — ABNORMAL HIGH
pH, Arterial: 7.598 — ABNORMAL HIGH
pO2, Arterial: 412 — ABNORMAL HIGH
pO2, Arterial: 535 — ABNORMAL HIGH
pO2, Arterial: 537 — ABNORMAL HIGH
pO2, Arterial: 543 — ABNORMAL HIGH
pO2, Arterial: 547 — ABNORMAL HIGH
pO2, Arterial: 547 — ABNORMAL HIGH
pO2, Arterial: 569 — ABNORMAL HIGH

## 2011-07-06 LAB — DIFFERENTIAL
Basophils Absolute: 0 10*3/uL (ref 0.0–0.1)
Basophils Absolute: 0
Basophils Absolute: 0
Basophils Relative: 1 % (ref 0–1)
Basophils Relative: 0
Basophils Relative: 0
Eosinophils Absolute: 0.1 10*3/uL (ref 0.0–0.7)
Eosinophils Absolute: 0.1
Eosinophils Absolute: 0.1
Eosinophils Relative: 2 % (ref 0–5)
Eosinophils Relative: 1
Eosinophils Relative: 2
Lymphocytes Relative: 26 % (ref 12–46)
Lymphocytes Relative: 15
Lymphocytes Relative: 35
Lymphs Abs: 1.6 10*3/uL (ref 0.7–4.0)
Lymphs Abs: 0.9
Lymphs Abs: 1.9
Monocytes Absolute: 0.7 10*3/uL (ref 0.1–1.0)
Monocytes Absolute: 0.4
Monocytes Absolute: 0.4
Monocytes Relative: 11 % (ref 3–12)
Monocytes Relative: 6
Monocytes Relative: 7
Neutro Abs: 3.6 10*3/uL (ref 1.7–7.7)
Neutro Abs: 3.1
Neutro Abs: 4.8
Neutrophils Relative %: 60 % (ref 43–77)
Neutrophils Relative %: 57
Neutrophils Relative %: 77

## 2011-07-06 LAB — POCT I-STAT 3, ART BLOOD GAS (G3+)
Acid-Base Excess: 1
Acid-Base Excess: 3 — ABNORMAL HIGH
Bicarbonate: 25.5 — ABNORMAL HIGH
Bicarbonate: 26.1 — ABNORMAL HIGH
O2 Saturation: 100
O2 Saturation: 100
Patient temperature: 97.8
Patient temperature: 98.4
TCO2: 27
TCO2: 27
pCO2 arterial: 32 — ABNORMAL LOW
pCO2 arterial: 37.9
pH, Arterial: 7.436 — ABNORMAL HIGH
pH, Arterial: 7.518 — ABNORMAL HIGH
pO2, Arterial: 178 — ABNORMAL HIGH
pO2, Arterial: 252 — ABNORMAL HIGH

## 2011-07-06 LAB — COMPREHENSIVE METABOLIC PANEL
ALT: 14 U/L (ref 0–35)
ALT: 16
ALT: 22
AST: 16 U/L (ref 0–37)
AST: 22
AST: 46 — ABNORMAL HIGH
Albumin: 2.7 g/dL — ABNORMAL LOW (ref 3.5–5.2)
Albumin: 2.4 — ABNORMAL LOW
Albumin: 3.9
Alkaline Phosphatase: 54 U/L (ref 39–117)
Alkaline Phosphatase: 33 — ABNORMAL LOW
Alkaline Phosphatase: 53
BUN: 8 mg/dL (ref 6–23)
BUN: 10
BUN: 14
CO2: 35 mEq/L — ABNORMAL HIGH (ref 19–32)
CO2: 24
CO2: 34 — ABNORMAL HIGH
Calcium: 9 mg/dL (ref 8.4–10.5)
Calcium: 7.3 — ABNORMAL LOW
Calcium: 9.7
Chloride: 97 mEq/L (ref 96–112)
Chloride: 106
Chloride: 99
Creatinine, Ser: 0.6 mg/dL (ref 0.4–1.2)
Creatinine, Ser: 0.66
Creatinine, Ser: 0.67
GFR calc Af Amer: >60 mL/min (ref >60)
GFR calc Af Amer: >60
GFR calc Af Amer: >60
GFR calc non Af Amer: >60 mL/min (ref >60)
GFR calc non Af Amer: >60
GFR calc non Af Amer: >60
Glucose, Bld: 108 mg/dL — ABNORMAL HIGH (ref 70–99)
Glucose, Bld: 143 — ABNORMAL HIGH
Glucose, Bld: 92
Potassium: 4.2 mEq/L (ref 3.5–5.1)
Potassium: 3.3 — ABNORMAL LOW
Potassium: 4.3
Sodium: 135 mEq/L (ref 135–145)
Sodium: 136
Sodium: 138
Total Bilirubin: 0.9 mg/dL (ref 0.3–1.2)
Total Bilirubin: 0.3
Total Bilirubin: 2.7 — ABNORMAL HIGH
Total Protein: 5.7 g/dL — ABNORMAL LOW (ref 6.0–8.3)
Total Protein: 3.7 — ABNORMAL LOW
Total Protein: 6.6

## 2011-07-06 LAB — ALBUMIN: Albumin: 2.1 — ABNORMAL LOW

## 2011-07-06 LAB — PREPARE FRESH FROZEN PLASMA

## 2011-07-06 LAB — URINALYSIS, ROUTINE W REFLEX MICROSCOPIC
Bilirubin Urine: NEGATIVE
Glucose, UA: NEGATIVE
Hgb urine dipstick: NEGATIVE
Ketones, ur: NEGATIVE
Nitrite: NEGATIVE
Protein, ur: NEGATIVE
Specific Gravity, Urine: 1.009
Urobilinogen, UA: 0.2
pH: 7

## 2011-07-06 LAB — PREPARE PLATELET PHERESIS

## 2011-07-06 LAB — MAGNESIUM
Magnesium: 1.2 — ABNORMAL LOW
Magnesium: 1.8
Magnesium: 1.9
Magnesium: 2.1

## 2011-07-06 LAB — URINE CULTURE
Colony Count: NO GROWTH
Culture: NO GROWTH

## 2011-07-06 LAB — PROTIME-INR
INR: 1
INR: 1.1
INR: 1.1
INR: 1.1
INR: 1.2
INR: 1.3
INR: 1.7 — ABNORMAL HIGH
Prothrombin Time: 12.8
Prothrombin Time: 14.4
Prothrombin Time: 14.7
Prothrombin Time: 14.9
Prothrombin Time: 15.7 — ABNORMAL HIGH
Prothrombin Time: 16.2 — ABNORMAL HIGH
Prothrombin Time: 20.6 — ABNORMAL HIGH

## 2011-07-06 LAB — B-NATRIURETIC PEPTIDE (CONVERTED LAB): Pro B Natriuretic peptide (BNP): <30 pg/mL (ref 0.0–100.0)

## 2011-07-06 LAB — PHOSPHORUS
Phosphorus: 1.6 — ABNORMAL LOW
Phosphorus: 1.7 — ABNORMAL LOW
Phosphorus: 2.5
Phosphorus: 4.5

## 2011-07-06 LAB — T4: T4, Total: 7.9

## 2011-07-06 LAB — APTT
aPTT: 27
aPTT: 28
aPTT: 31
aPTT: 31
aPTT: 31
aPTT: 34
aPTT: 58 — ABNORMAL HIGH

## 2011-07-06 LAB — POCT I-STAT 4, (NA,K, GLUC, HGB,HCT)
Glucose, Bld: 142 — ABNORMAL HIGH
HCT: 27 — ABNORMAL LOW
Hemoglobin: 9.2 — ABNORMAL LOW
Potassium: 3.2 — ABNORMAL LOW
Sodium: 138

## 2011-07-06 LAB — VITAMIN D 25 HYDROXY (VIT D DEFICIENCY, FRACTURES): Vit D, 25-Hydroxy: 23 — ABNORMAL LOW (ref 30–89)

## 2011-07-06 LAB — TSH: TSH: 14.423 — ABNORMAL HIGH

## 2011-07-06 LAB — PREALBUMIN: Prealbumin: 9.8 — ABNORMAL LOW

## 2011-07-06 LAB — VITAMIN D 1,25 DIHYDROXY: Vit D, 1,25-Dihydroxy: 111 pg/mL — ABNORMAL HIGH (ref 15–75)

## 2011-07-08 LAB — CBC
HCT: 28.9 % — ABNORMAL LOW (ref 36.0–46.0)
Hemoglobin: 9.8 g/dL — ABNORMAL LOW (ref 12.0–15.0)
MCHC: 33.8 g/dL (ref 30.0–36.0)
MCV: 93.4 fL (ref 78.0–100.0)
Platelets: 363 10*3/uL (ref 150–400)
RBC: 3.1 MIL/uL — ABNORMAL LOW (ref 3.87–5.11)
RDW: 14.8 % (ref 11.5–15.5)
WBC: 5.9 10*3/uL (ref 4.0–10.5)

## 2011-07-08 LAB — BASIC METABOLIC PANEL
BUN: 8 mg/dL (ref 6–23)
CO2: 33 mEq/L — ABNORMAL HIGH (ref 19–32)
Calcium: 8.9 mg/dL (ref 8.4–10.5)
Chloride: 96 mEq/L (ref 96–112)
Creatinine, Ser: 0.5 mg/dL (ref 0.4–1.2)
GFR calc Af Amer: >60 mL/min (ref >60)
GFR calc non Af Amer: >60 mL/min (ref >60)
Glucose, Bld: 102 mg/dL — ABNORMAL HIGH (ref 70–99)
Potassium: 3.6 mEq/L (ref 3.5–5.1)
Sodium: 135 mEq/L (ref 135–145)

## 2011-12-02 LAB — HM PAP SMEAR: HM Pap smear: NORMAL

## 2011-12-02 LAB — HM MAMMOGRAPHY: HM Mammogram: NORMAL

## 2012-01-31 ENCOUNTER — Encounter: Payer: Self-pay | Admitting: Gynecology

## 2012-01-31 ENCOUNTER — Ambulatory Visit (INDEPENDENT_AMBULATORY_CARE_PROVIDER_SITE_OTHER): Payer: MEDICARE | Admitting: Gynecology

## 2012-01-31 VITALS — BP 130/74 | Ht 66.75 in | Wt 140.0 lb

## 2012-01-31 DIAGNOSIS — M5137 Other intervertebral disc degeneration, lumbosacral region: Secondary | ICD-10-CM | POA: Insufficient documentation

## 2012-01-31 DIAGNOSIS — M899 Disorder of bone, unspecified: Secondary | ICD-10-CM

## 2012-01-31 DIAGNOSIS — Z01419 Encounter for gynecological examination (general) (routine) without abnormal findings: Secondary | ICD-10-CM

## 2012-01-31 DIAGNOSIS — M1990 Unspecified osteoarthritis, unspecified site: Secondary | ICD-10-CM | POA: Insufficient documentation

## 2012-01-31 DIAGNOSIS — F329 Major depressive disorder, single episode, unspecified: Secondary | ICD-10-CM | POA: Insufficient documentation

## 2012-01-31 DIAGNOSIS — M51379 Other intervertebral disc degeneration, lumbosacral region without mention of lumbar back pain or lower extremity pain: Secondary | ICD-10-CM | POA: Insufficient documentation

## 2012-01-31 DIAGNOSIS — M359 Systemic involvement of connective tissue, unspecified: Secondary | ICD-10-CM | POA: Insufficient documentation

## 2012-01-31 DIAGNOSIS — F32A Depression, unspecified: Secondary | ICD-10-CM | POA: Insufficient documentation

## 2012-01-31 DIAGNOSIS — Z79890 Hormone replacement therapy (postmenopausal): Secondary | ICD-10-CM

## 2012-01-31 DIAGNOSIS — M8580 Other specified disorders of bone density and structure, unspecified site: Secondary | ICD-10-CM | POA: Insufficient documentation

## 2012-01-31 MED ORDER — ESTRADIOL 1 MG PO TABS: 1.0000 mg | ORAL_TABLET | Freq: Every day | ORAL | Status: DC

## 2012-01-31 NOTE — Patient Instructions (Signed)
Follow up for bone density study as scheduled. Wean off estrogen as discussed.

## 2012-01-31 NOTE — Progress Notes (Signed)
Sonya Cordova 1946/01/17 [id number]        66 y.o.  New patient for annual exam.  Former patient of Dr. Angeline Slim. Several issues noted below.  Past medical history,surgical history, medications, allergies, family history and social history were all reviewed and documented in the EPIC chart. ROS:  Was performed and pertinent positives and negatives are included in the history.  Exam: Amy chaperone present Filed Vitals:   01/31/12 1053  BP: 130/74   General appearance  Normal Skin grossly normal Head/Neck normal with no cervical or supraclavicular adenopathy thyroid normal Lungs  clear Cardiac RR, without RMG Abdominal  soft, nontender, without masses, organomegaly or hernia Breasts  examined lying and sitting without masses, retractions, discharge or axillary adenopathy. Pelvic  Ext/BUS/vagina  normal with mild atrophic changes  Adnexa  Without masses or tenderness    Anus and perineum  normal   Rectovaginal  normal sphincter tone without palpated masses or tenderness.    Assessment/Plan:  66 y.o. female for annual exam.    1. ERT. Patient is on Estrace 2 mg daily. Has been on this a number of years.  Status post vaginal hysterectomy for menorrhagia/leiomyoma. I reviewed the issues of ERT, risks/benefits, WHI study with increased risk of stroke heart attack DVT and possible breast cancer risk. ACOG and NAMS statements for lowest dose for shortest period of time.  Patient will decrease to 1 mg daily for one to 2 months than 0.5 mg and then ultimately will wean herself off. If she does well then she will stay off of ERT. She does not and she accepts above risks and she will continue at a lower dose. I refilled her  Estradiol 1 mg number 90 with 4 refills. 2. Pap smear. Patient has no history of abnormal Pap smears with last Pap smear 2012 with high risk HPV negative. She is status post hysterectomy for benign indications and 66.65. Our review current screening guidelines we both agree  to stop doing Pap smears. 3. Mammography. Patient is due for her mammogram now knows to schedule this. SBE monthly reviewed. 4. Osteopenia. Patient has a DEXA from 2006 which shows osteopenia -1.1. She states she's had one since then but is not sure where. It has been at least 5 years though since that DEXA and I went ahead and schedule one today. Increase calcium and vitamin D reviewed. 5. Colonoscopy. Patient's had her colonoscopy within the past year historically which was negative. 6. Swelling. Patient reports at the end of the day she has bilateral lower extremity swelling. Resolves overnight with elevation. I recommended she follow up with her primary for further evaluation. The differential to include cardiac versus metabolic such as renal/hepatic were reviewed and she is to follow up with her primary physician for further evaluation. 7. Health maintenance. No blood work was done today as she has all done through her primary physician's office and again she'll follow up with him in reference to her swelling.    Dara Lords MD, 1:59 PM 01/31/2012

## 2012-02-01 LAB — URINALYSIS W MICROSCOPIC + REFLEX CULTURE
Bacteria, UA: NONE SEEN
Bilirubin Urine: NEGATIVE
Casts: NONE SEEN
Crystals: NONE SEEN
Glucose, UA: NEGATIVE mg/dL
Hgb urine dipstick: NEGATIVE
Ketones, ur: NEGATIVE mg/dL
Leukocytes, UA: NEGATIVE
Nitrite: NEGATIVE
Protein, ur: NEGATIVE mg/dL
Specific Gravity, Urine: 1.007 (ref 1.005–1.030)
Squamous Epithelial / LPF: NONE SEEN
Urobilinogen, UA: 0.2 mg/dL (ref 0.0–1.0)
pH: 5 (ref 5.0–8.0)

## 2012-02-02 DIAGNOSIS — M8580 Other specified disorders of bone density and structure, unspecified site: Secondary | ICD-10-CM

## 2012-02-02 HISTORY — DX: Other specified disorders of bone density and structure, unspecified site: M85.80

## 2012-02-03 ENCOUNTER — Encounter: Payer: Self-pay | Admitting: Gynecology

## 2012-02-03 ENCOUNTER — Other Ambulatory Visit: Payer: Self-pay | Admitting: Gynecology

## 2012-02-03 ENCOUNTER — Ambulatory Visit (INDEPENDENT_AMBULATORY_CARE_PROVIDER_SITE_OTHER): Payer: Medicare Other

## 2012-02-03 DIAGNOSIS — M899 Disorder of bone, unspecified: Secondary | ICD-10-CM

## 2012-02-03 DIAGNOSIS — M949 Disorder of cartilage, unspecified: Secondary | ICD-10-CM

## 2012-02-03 DIAGNOSIS — Z1231 Encounter for screening mammogram for malignant neoplasm of breast: Secondary | ICD-10-CM

## 2012-02-03 DIAGNOSIS — M8580 Other specified disorders of bone density and structure, unspecified site: Secondary | ICD-10-CM

## 2012-02-15 ENCOUNTER — Other Ambulatory Visit: Payer: Self-pay | Admitting: Orthopedic Surgery

## 2012-02-15 LAB — HM DEXA SCAN

## 2012-02-15 NOTE — Progress Notes (Signed)
Preoperative surgical orders have been place into the Epic hospital system for Sonya Cordova on 02/15/2012, 12:27 PM  by Patrica Duel for surgery on 05/22/2012.  Preop Total Knee orders including Bupivacaine On-Q pump, IV Tylenol, and IV Decadron as long as there are no contraindications to the above medications.

## 2012-02-24 ENCOUNTER — Other Ambulatory Visit: Payer: Self-pay | Admitting: Dermatology

## 2012-02-29 ENCOUNTER — Ambulatory Visit: Payer: Medicare Other

## 2012-05-04 ENCOUNTER — Ambulatory Visit
Admission: RE | Admit: 2012-05-04 | Discharge: 2012-05-04 | Disposition: A | Discharge status: Home / Self Care | Payer: Medicare Other | Source: Ambulatory Visit | Attending: Gynecology | Admitting: Gynecology

## 2012-05-04 DIAGNOSIS — Z1231 Encounter for screening mammogram for malignant neoplasm of breast: Secondary | ICD-10-CM

## 2012-05-10 ENCOUNTER — Encounter (HOSPITAL_COMMUNITY): Payer: Self-pay | Admitting: Pharmacy Technician

## 2012-05-16 ENCOUNTER — Encounter (HOSPITAL_COMMUNITY): Payer: Self-pay

## 2012-05-16 ENCOUNTER — Encounter (HOSPITAL_COMMUNITY)
Admission: RE | Admit: 2012-05-16 | Discharge: 2012-05-16 | Disposition: A | Discharge status: Home / Self Care | Payer: Medicare Other | Source: Ambulatory Visit | Attending: Orthopedic Surgery | Admitting: Orthopedic Surgery

## 2012-05-16 ENCOUNTER — Other Ambulatory Visit: Payer: Self-pay | Admitting: Orthopedic Surgery

## 2012-05-16 HISTORY — DX: Localized edema: R60.0

## 2012-05-16 HISTORY — DX: Headache: R51

## 2012-05-16 HISTORY — DX: Family history of other specified conditions: Z84.89

## 2012-05-16 HISTORY — DX: Adverse effect of unspecified anesthetic, initial encounter: T41.45XA

## 2012-05-16 HISTORY — DX: Anemia, unspecified: D64.9

## 2012-05-16 HISTORY — DX: Hypothyroidism, unspecified: E03.9

## 2012-05-16 HISTORY — DX: Personal history of other medical treatment: Z92.89

## 2012-05-16 HISTORY — DX: Other complications of anesthesia, initial encounter: T88.59XA

## 2012-05-16 LAB — COMPREHENSIVE METABOLIC PANEL
ALT: 16 U/L (ref 0–35)
AST: 25 U/L (ref 0–37)
Albumin: 3.9 g/dL (ref 3.5–5.2)
Alkaline Phosphatase: 53 U/L (ref 39–117)
BUN: 30 mg/dL — ABNORMAL HIGH (ref 6–23)
CO2: 33 mEq/L — ABNORMAL HIGH (ref 19–32)
Calcium: 9.3 mg/dL (ref 8.4–10.5)
Chloride: 99 mEq/L (ref 96–112)
Creatinine, Ser: 0.85 mg/dL (ref 0.50–1.10)
GFR calc Af Amer: 82 mL/min — ABNORMAL LOW (ref >90)
GFR calc non Af Amer: 71 mL/min — ABNORMAL LOW (ref >90)
Glucose, Bld: 98 mg/dL (ref 70–99)
Potassium: 4.2 mEq/L (ref 3.5–5.1)
Sodium: 136 mEq/L (ref 135–145)
Total Bilirubin: 0.5 mg/dL (ref 0.3–1.2)
Total Protein: 6.7 g/dL (ref 6.0–8.3)

## 2012-05-16 LAB — CBC
HCT: 38.4 % (ref 36.0–46.0)
Hemoglobin: 13.1 g/dL (ref 12.0–15.0)
MCH: 32 pg (ref 26.0–34.0)
MCHC: 34.1 g/dL (ref 30.0–36.0)
MCV: 93.7 fL (ref 78.0–100.0)
Platelets: 158 10*3/uL (ref 150–400)
RBC: 4.1 MIL/uL (ref 3.87–5.11)
RDW: 12.9 % (ref 11.5–15.5)
WBC: 5.2 10*3/uL (ref 4.0–10.5)

## 2012-05-16 LAB — URINALYSIS, ROUTINE W REFLEX MICROSCOPIC
Glucose, UA: NEGATIVE mg/dL
Hgb urine dipstick: NEGATIVE
Ketones, ur: NEGATIVE mg/dL
Leukocytes, UA: NEGATIVE
Nitrite: NEGATIVE
Protein, ur: NEGATIVE mg/dL
Specific Gravity, Urine: 1.031 — ABNORMAL HIGH (ref 1.005–1.030)
Urobilinogen, UA: 1 mg/dL (ref 0.0–1.0)
pH: 5.5 (ref 5.0–8.0)

## 2012-05-16 LAB — PROTIME-INR
INR: 0.85 (ref 0.00–1.49)
Prothrombin Time: 11.8 seconds (ref 11.6–15.2)

## 2012-05-16 LAB — APTT: aPTT: 32 seconds (ref 24–37)

## 2012-05-16 LAB — SURGICAL PCR SCREEN
MRSA, PCR: NEGATIVE
Staphylococcus aureus: NEGATIVE

## 2012-05-16 LAB — ABO/RH: ABO/RH(D): B POS

## 2012-05-16 MED ORDER — DEXAMETHASONE SODIUM PHOSPHATE 10 MG/ML IJ SOLN: 10.0000 mg | Freq: Once | INTRAMUSCULAR | Status: DC

## 2012-05-16 MED ORDER — BUPIVACAINE 0.25 % ON-Q PUMP SINGLE CATH 300ML: 300.0000 mL | INJECTION | Status: DC

## 2012-05-16 NOTE — Pre-Procedure Instructions (Signed)
Faxed abnormal cmet results to Dr Lequita Halt with confirmation- also allergies listed for antibiotic- requested clarification for pre op

## 2012-05-16 NOTE — Pre-Procedure Instructions (Signed)
EKG clearance and LOV  Dr Andrey Spearman on chart.  FAXED REQUEST TO DR Lequita Halt WITH CONFIRMATION THAT NEED PRE OP ORDERS. PATIENT INSTRUCTED TO BRING NERVE STIMULATOR REMOTE DAY OF SURGERY. Surgery schedular ANN notififed to place in comment section for anesthesia day of surgery

## 2012-05-16 NOTE — Patient Instructions (Signed)
20 Sonya Cordova  05/16/2012   Your procedure is scheduled on:  05/22/12   Monday  Surgery [id number]  Report to [facility] at   0515    AM.  Call this number if you have problems the morning of surgery: [phone]     Or PST   [phone]  [facility]   Remember:   Do not eat food or drink any fluids After Midnight. Sunday NIGHT    Take these medicines the morning of surgery with A SIP OF WATER: SYNTHYROID, NEURONTIN, ROBAXIN,                                       MAY TAKE MORPHINE AS NEEDED   Do not wear jewelry, make-up or nail polish.  Do not wear lotions, powders, or perfumes. You may wear deodorant.  Do not shave 48 hours prior to surgery.  Do not bring valuables to the hospital.  Contacts, dentures or bridgework may not be worn into surgery.  Leave suitcase in the car. After surgery it may be brought to your room.  For patients admitted to the hospital, checkout time is 11:00 AM the day of discharge.   Patients discharged the day of surgery will not be allowed to drive home.  Name and phone number of your driver:     Husband or rehab                                                                 Special Instructions: CHG Shower Use Special Wash: 1/2 bottle night before surgery and 1/2 bottle morning of surgery. REGULAR SOAP FACE AND PRIVATES              LADIES- NO SHAVING 48 HOURS BEFORE USING BETASEPT SOAP.                   Please read over the following fact sheets that you were given: MRSA Information

## 2012-05-16 NOTE — Progress Notes (Signed)
Preoperative surgical orders have been place into the Epic hospital system for Sonya Cordova on 05/16/2012, 4:40 PM  by Patrica Duel for surgery on 05/22/2012.  Preop Total Knee orders including Bupivacaine On-Q pump, IV Tylenol, and IV Decadron as long as there are no contraindications to the above medications. Sonya Peace, PA-C These orders were originally entered into epic but due to a default 90 day rule, the orders were deleted automatically by the EPIC system requiring them to be reentered again. Sonya Peace, PA-C

## 2012-05-17 NOTE — Pre-Procedure Instructions (Signed)
Clarification antibiotic order per Alexis Goodell PA/ to with read back

## 2012-05-17 NOTE — Pre-Procedure Instructions (Signed)
Received fax from Dr Lequita Halt- no action re labs- CMET

## 2012-05-19 ENCOUNTER — Other Ambulatory Visit: Payer: Self-pay | Admitting: Orthopedic Surgery

## 2012-05-19 NOTE — H&P (Signed)
Sonya Cordova  DOB: September 16, 1946 Married / Language: English / Race: White Female  Date of Admission:  05/22/2012  Chief complaint:  Left knee pain  History of Present Illness The patient is a 66 year old female who comes in today for a preoperative History and Physical. The patient is scheduled for a left total knee arthroplasty to be performed by Dr. Gus Rankin. Aluisio, MD at [facility] on 05/22/2012. The patient is a 66 year old female who presents today for follow up of their knee. The patient is being followed for their left knee pain and osteoarthritis. Symptoms reported today include: pain and swelling. The patient feels that they are doing poorly and report their pain level to be moderate to severe. The following medication has been used for pain control: Morphine and Arthrotec. The patient has not gotten any relief of their symptoms with Cortisone injections or viscosupplementation. The patient indicates that they have questions or concerns today regarding total knee arthroplasty. She is scheduled for 05/22/12. She states that the left knee bothers her more than the right knee ever did before she had that replaced. We have been treating this for several years now with cortisone and visco supplements. Nothing is working anymore. She is at a stage now where she is ready to go ahead and get it fixed. They have been treated conservatively in the past for the above stated problem and despite conservative measures, they continue to have progressive pain and severe functional limitations and dysfunction. They have failed non-operative management including home exercise, medications, and injections. It is felt that they would benefit from undergoing total joint replacement. Risks and benefits of the procedure have been discussed with the patient and they elect to proceed with surgery. There are no active contraindications to surgery such as ongoing infection or rapidly progressive  neurological disease.     Allergies Cleocin *ANTI-INFECTIVE AGENTS - MISC.*. Respiratory distress. Codeine Sulfate *ANALGESICS - OPIOID*. Nausea. Patient states that she has taken Vicodin in the past and did OK with it. Augmentin *PENICILLINS*. Hives.   Family History Hypertension. mother, father, sister and brother Heart disease in female family member before age 13 Rheumatoid Arthritis. mother Osteoarthritis. mother, father, sister and brother Cerebrovascular Accident. father and sister Cancer. mother Heart Disease. father and sister Depression. mother   Social History Marital status. married Living situation. live with spouse Illicit drug use. no Tobacco use. Never smoker. former smoker; smoke(d) 2 ppd for 35 years Tobacco / smoke exposure. no Number of flights of stairs before winded. 2-3 Current work status. disabled Children. 2 Alcohol use. Never consumed alcohol. former drinker Exercise. Exercises daily; does other Drug/Alcohol Rehab (Previously). no Drug/Alcohol Rehab (Currently). no Current occupation. RN, disabled   Medication History Morphine Sulfate (30MG  Tablet, Oral) Active. Lunesta ( Oral) Specific dose unknown - Active. Estradiol (2MG  Tablet, Oral) Active. Synthroid ( Tablet, Oral) Active. Arthrotec (75-200MG -MCG Tablet, Oral) Active. Methocarbamol (500MG  Tablet, Oral) Active. Gabapentin (100MG  Capsule, Oral) Active. Torsemide (20MG  Tablet, Oral) Active. MiraLax ( Oral) Active. Stool Softener (100MG  Capsule, Oral) Active.   Past Surgical History Spinal Surgery Straighten Nasal Septum Spinal Decompression. lower back Spinal Fusion. neck and lower back Total Knee Replacement. right Tubal Ligation Thyroidectomy; Subtotal Tonsillectomy Dilation and Curettage of Uterus Hemorrhoidectomy Appendectomy Arthroscopy of Knee. right Sinus Surgery Hysterectomy. complete (non-cancerous) Neck Disc  Surgery   Past Medical history Osteoarthritis Peripheral Neuropathy Migraine Headache Connective Tissue Disease Chronic Pain Hypothyroidism Depression Tinnitus Hemorrhoids Urinary Incontinence Osteopenia Degenerative Disc Disease Bursitis  Measles Mumps Nonspecific Connective Tissue Disorder   Review of Systems General:Present- Fatigue. Not Present- Chills, Fever, Night Sweats, Weight Gain, Weight Loss and Memory Loss. Skin:Not Present- Hives, Itching, Rash, Eczema and Lesions. HEENT:Present- Headache. Not Present- Tinnitus, Double Vision, Visual Loss, Hearing Loss and Dentures. Respiratory:Not Present- Shortness of breath with exertion, Shortness of breath at rest, Allergies, Coughing up blood and Chronic Cough. Cardiovascular:Not Present- Chest Pain, Racing/skipping heartbeats, Difficulty Breathing Lying Down, Murmur, Swelling and Palpitations. Gastrointestinal:Not Present- Bloody Stool, Heartburn, Abdominal Pain, Vomiting, Nausea, Constipation, Diarrhea, Difficulty Swallowing, Jaundice and Loss of appetitie. Female Genitourinary:Present- Urinating at Night. Not Present- Blood in Urine, Urinary frequency, Weak urinary stream, Discharge, Flank Pain, Incontinence, Painful Urination, Urgency and Urinary Retention. Musculoskeletal:Present- Muscle Weakness, Joint Swelling, Joint Pain, Back Pain, Morning Stiffness and Spasms. Not Present- Muscle Pain. Neurological:Not Present- Tremor, Dizziness, Blackout spells, Paralysis, Difficulty with balance and Weakness. Psychiatric:Present- Insomnia.   Vitals Weight: 140 lb Height: 67 in Body Surface Area: 1.73 m Body Mass Index: 21.93 kg/m Pulse: 68 (Regular) Resp.: 16 (Unlabored) BP: 128/72 (Sitting, Left Arm, Standard)    Physical Exam The physical exam findings are as follows:  Note: Patient is a 66 year old female with continued knee pain.   General Mental Status - Alert, cooperative and good  historian. General Appearance- pleasant. Not in acute distress. Orientation- Oriented X3. Build & Nutrition- Well nourished and Well developed.   Head and Neck Head- normocephalic, atraumatic . Neck Global Assessment- supple. no bruit auscultated on the right and no bruit auscultated on the left.   Eye Pupil- Bilateral- Regular and Round. Motion- Bilateral- EOMI.   Note: wears contacts  Chest and Lung Exam Auscultation: Breath sounds:- clear at anterior chest wall and - clear at posterior chest wall. Adventitious sounds:- No Adventitious sounds.   Cardiovascular Auscultation:Rhythm- Regular rate and rhythm. Heart Sounds- S1 WNL and S2 WNL. Murmurs & Other Heart Sounds:Auscultation of the heart reveals - No Murmurs.   Abdomen Palpation/Percussion:Tenderness- Abdomen is non-tender to palpation. Rigidity (guarding)- Abdomen is soft. Auscultation:Auscultation of the abdomen reveals - Bowel sounds normal.   Note: Spine stimulator is located in the right lower abdominal quadrant.  Female Genitourinary Not done, not pertinent to present illness  Musculoskeletal On exam well developed female alert and oriented in no apparent distress. Left knee shows slight varus deformity. Range 10 to 125. Marked crepitus on range of motion. Tenderness medial greater than lateral with no instability noted.  RADIOGRAPHS: Radiographs taken today AP and lateral of the knee show her prosthesis on the right in good position with no abnormalities. On the left she has bone on bone arthritis in the medial and patellofemoral compartments, tibial subluxation.  Assessment & Plan Osteoarthritis, Knee (715.96) Impression: Left Knee  Note: Patient is for a Left Total Knee Replacement by Dr. Lequita Halt.  Plan to go home versus SNF depending upon progress.  Patient has had problems with nausea and vomiting with previous anesthesia in the past.  Signed electronically by Roberts Gaudy, PA-C

## 2012-05-21 NOTE — Anesthesia Preprocedure Evaluation (Addendum)
Anesthesia Evaluation  Patient identified by MRN, date of birth, ID band Patient awake    Reviewed: Allergy & Precautions, H&P , NPO status , Patient's Chart, lab work & pertinent test results  History of Anesthesia Complications (+) AWARENESS UNDER ANESTHESIA and Family history of anesthesia reaction  Airway Mallampati: II TM Distance: >3 FB Neck ROM: Limited  Mouth opening: Limited Mouth Opening  Dental  (+) Dental Advisory Given   Pulmonary neg pulmonary ROS,  breath sounds clear to auscultation  Pulmonary exam normal       Cardiovascular negative cardio ROS  Rhythm:Regular Rate:Normal     Neuro/Psych  Headaches, Depression Spinal cord stimulator implant    GI/Hepatic negative GI ROS, Neg liver ROS,   Endo/Other  Hypothyroidism   Renal/GU negative Renal ROS  negative genitourinary   Musculoskeletal negative musculoskeletal ROS (+)   Abdominal   Peds  Hematology negative hematology ROS (+)   Anesthesia Other Findings   Reproductive/Obstetrics negative OB ROS                          Anesthesia Physical Anesthesia Plan  ASA: II  Anesthesia Plan: General   Post-op Pain Management:    Induction: Intravenous  Airway Management Planned: Oral ETT  Additional Equipment:   Intra-op Plan:   Post-operative Plan: Extubation in OR  Informed Consent: I have reviewed the patients History and Physical, chart, labs and discussed the procedure including the risks, benefits and alternatives for the proposed anesthesia with the patient or authorized representative who has indicated his/her understanding and acceptance.   Dental advisory given  Plan Discussed with: CRNA  Anesthesia Plan Comments:         Anesthesia Quick Evaluation

## 2012-05-22 ENCOUNTER — Encounter (HOSPITAL_COMMUNITY)
Admission: RE | Disposition: A | Discharge status: Skilled Nursing Facility | Payer: Self-pay | Source: Ambulatory Visit | Attending: Orthopedic Surgery

## 2012-05-22 ENCOUNTER — Encounter (HOSPITAL_COMMUNITY): Payer: Self-pay | Admitting: Orthopedic Surgery

## 2012-05-22 ENCOUNTER — Ambulatory Visit (HOSPITAL_COMMUNITY): Payer: Medicare Other | Admitting: Anesthesiology

## 2012-05-22 ENCOUNTER — Encounter (HOSPITAL_COMMUNITY): Payer: Self-pay | Admitting: Anesthesiology

## 2012-05-22 ENCOUNTER — Inpatient Hospital Stay (HOSPITAL_COMMUNITY)
Admission: RE | Admit: 2012-05-22 | Discharge: 2012-05-25 | DRG: 470 | Disposition: A | Discharge status: Skilled Nursing Facility | Payer: Medicare Other | Source: Ambulatory Visit | Attending: Orthopedic Surgery | Admitting: Orthopedic Surgery

## 2012-05-22 ENCOUNTER — Encounter (HOSPITAL_COMMUNITY): Payer: Self-pay | Admitting: *Deleted

## 2012-05-22 DIAGNOSIS — Z880 Allergy status to penicillin: Secondary | ICD-10-CM

## 2012-05-22 DIAGNOSIS — E039 Hypothyroidism, unspecified: Secondary | ICD-10-CM | POA: Diagnosis present

## 2012-05-22 DIAGNOSIS — D62 Acute posthemorrhagic anemia: Secondary | ICD-10-CM | POA: Diagnosis not present

## 2012-05-22 DIAGNOSIS — M899 Disorder of bone, unspecified: Secondary | ICD-10-CM | POA: Diagnosis present

## 2012-05-22 DIAGNOSIS — Z9289 Personal history of other medical treatment: Secondary | ICD-10-CM

## 2012-05-22 DIAGNOSIS — Z79899 Other long term (current) drug therapy: Secondary | ICD-10-CM

## 2012-05-22 DIAGNOSIS — M1990 Unspecified osteoarthritis, unspecified site: Secondary | ICD-10-CM | POA: Diagnosis present

## 2012-05-22 DIAGNOSIS — M1710 Unilateral primary osteoarthritis, unspecified knee: Secondary | ICD-10-CM

## 2012-05-22 DIAGNOSIS — G609 Hereditary and idiopathic neuropathy, unspecified: Secondary | ICD-10-CM | POA: Diagnosis present

## 2012-05-22 DIAGNOSIS — F3289 Other specified depressive episodes: Secondary | ICD-10-CM | POA: Diagnosis present

## 2012-05-22 DIAGNOSIS — Z96659 Presence of unspecified artificial knee joint: Secondary | ICD-10-CM

## 2012-05-22 DIAGNOSIS — F329 Major depressive disorder, single episode, unspecified: Secondary | ICD-10-CM | POA: Diagnosis present

## 2012-05-22 HISTORY — PX: TOTAL KNEE ARTHROPLASTY: SHX125

## 2012-05-22 SURGERY — ARTHROPLASTY, KNEE, TOTAL
Anesthesia: General | Site: Knee | Laterality: Left | Wound class: Clean

## 2012-05-22 MED ORDER — 0.9 % SODIUM CHLORIDE (POUR BTL) OPTIME
TOPICAL | Status: DC | PRN
  Administered 2012-05-22: 1000 mL

## 2012-05-22 MED ORDER — MORPHINE SULFATE (PF) 1 MG/ML IV SOLN
INTRAVENOUS | Status: DC
  Administered 2012-05-22: 5 mg via INTRAVENOUS
  Administered 2012-05-22: 1 mg via INTRAVENOUS
  Administered 2012-05-22: 3 mg via INTRAVENOUS
  Administered 2012-05-22: 13 mg via INTRAVENOUS
  Administered 2012-05-23: 12.4 mg via INTRAVENOUS
  Administered 2012-05-23: 11 mg via INTRAVENOUS
  Administered 2012-05-23: 2.59 mg via INTRAVENOUS
  Filled 2012-05-22: qty 25

## 2012-05-22 MED ORDER — SODIUM CHLORIDE 0.9 % IV SOLN: INTRAVENOUS | Status: DC

## 2012-05-22 MED ORDER — ACETAMINOPHEN 650 MG RE SUPP: 650.0000 mg | Freq: Four times a day (QID) | RECTAL | Status: DC | PRN

## 2012-05-22 MED ORDER — GABAPENTIN 300 MG PO CAPS
300.0000 mg | ORAL_CAPSULE | Freq: Three times a day (TID) | ORAL | Status: DC
  Administered 2012-05-22 – 2012-05-25 (×9): 300 mg via ORAL
  Filled 2012-05-22 (×11): qty 1

## 2012-05-22 MED ORDER — MIDAZOLAM HCL 5 MG/5ML IJ SOLN
INTRAMUSCULAR | Status: DC | PRN
  Administered 2012-05-22: 2 mg via INTRAVENOUS

## 2012-05-22 MED ORDER — DEXTROSE-NACL 5-0.9 % IV SOLN
INTRAVENOUS | Status: DC
  Administered 2012-05-22 – 2012-05-24 (×3): via INTRAVENOUS

## 2012-05-22 MED ORDER — DIPHENHYDRAMINE HCL 50 MG/ML IJ SOLN: 12.5000 mg | Freq: Four times a day (QID) | INTRAMUSCULAR | Status: DC | PRN

## 2012-05-22 MED ORDER — MIDAZOLAM HCL 5 MG/ML IJ SOLN
INTRAMUSCULAR | Status: AC
  Filled 2012-05-22: qty 1

## 2012-05-22 MED ORDER — LIDOCAINE HCL (CARDIAC) 20 MG/ML IV SOLN
INTRAVENOUS | Status: DC | PRN
  Administered 2012-05-22: 70 mg via INTRAVENOUS

## 2012-05-22 MED ORDER — DIPHENHYDRAMINE HCL 12.5 MG/5ML PO ELIX: 12.5000 mg | ORAL_SOLUTION | ORAL | Status: DC | PRN

## 2012-05-22 MED ORDER — ONDANSETRON HCL 4 MG/2ML IJ SOLN: 4.0000 mg | Freq: Four times a day (QID) | INTRAMUSCULAR | Status: DC | PRN

## 2012-05-22 MED ORDER — EPHEDRINE SULFATE 50 MG/ML IJ SOLN
INTRAMUSCULAR | Status: DC | PRN
  Administered 2012-05-22: 5 mg via INTRAVENOUS
  Administered 2012-05-22: 10 mg via INTRAVENOUS

## 2012-05-22 MED ORDER — ONDANSETRON HCL 4 MG PO TABS: 4.0000 mg | ORAL_TABLET | Freq: Four times a day (QID) | ORAL | Status: DC | PRN

## 2012-05-22 MED ORDER — PROMETHAZINE HCL 25 MG/ML IJ SOLN: 6.2500 mg | INTRAMUSCULAR | Status: DC | PRN

## 2012-05-22 MED ORDER — SCOPOLAMINE 1 MG/3DAYS TD PT72
MEDICATED_PATCH | TRANSDERMAL | Status: DC | PRN
  Administered 2012-05-22: 1 via TRANSDERMAL

## 2012-05-22 MED ORDER — METHOCARBAMOL 500 MG PO TABS
1000.0000 mg | ORAL_TABLET | Freq: Four times a day (QID) | ORAL | Status: DC
  Administered 2012-05-22 – 2012-05-25 (×12): 1000 mg via ORAL
  Filled 2012-05-22 (×19): qty 2

## 2012-05-22 MED ORDER — POLYETHYLENE GLYCOL 3350 17 G PO PACK: 17.0000 g | PACK | Freq: Every day | ORAL | Status: DC | PRN

## 2012-05-22 MED ORDER — VANCOMYCIN HCL IN DEXTROSE 1-5 GM/200ML-% IV SOLN
INTRAVENOUS | Status: AC
  Filled 2012-05-22: qty 200

## 2012-05-22 MED ORDER — ACETAMINOPHEN 10 MG/ML IV SOLN: 1000.0000 mg | Freq: Once | INTRAVENOUS | Status: DC

## 2012-05-22 MED ORDER — DOCUSATE SODIUM 100 MG PO CAPS
100.0000 mg | ORAL_CAPSULE | Freq: Two times a day (BID) | ORAL | Status: DC
  Administered 2012-05-22 – 2012-05-25 (×7): 100 mg via ORAL

## 2012-05-22 MED ORDER — VANCOMYCIN HCL IN DEXTROSE 1-5 GM/200ML-% IV SOLN
1000.0000 mg | Freq: Two times a day (BID) | INTRAVENOUS | Status: AC
  Administered 2012-05-22: 1000 mg via INTRAVENOUS
  Filled 2012-05-22: qty 200

## 2012-05-22 MED ORDER — GLYCOPYRROLATE 0.2 MG/ML IJ SOLN
INTRAMUSCULAR | Status: DC | PRN
  Administered 2012-05-22: 0.4 mg via INTRAVENOUS

## 2012-05-22 MED ORDER — FLEET ENEMA 7-19 GM/118ML RE ENEM: 1.0000 | ENEMA | Freq: Once | RECTAL | Status: AC | PRN

## 2012-05-22 MED ORDER — MORPHINE SULFATE 15 MG PO TABS
30.0000 mg | ORAL_TABLET | ORAL | Status: DC | PRN
  Administered 2012-05-22 – 2012-05-25 (×13): 30 mg via ORAL
  Filled 2012-05-22 (×13): qty 2

## 2012-05-22 MED ORDER — SCOPOLAMINE 1 MG/3DAYS TD PT72
MEDICATED_PATCH | TRANSDERMAL | Status: AC
  Filled 2012-05-22: qty 1

## 2012-05-22 MED ORDER — SUCCINYLCHOLINE CHLORIDE 20 MG/ML IJ SOLN
INTRAMUSCULAR | Status: DC | PRN
  Administered 2012-05-22: 100 mg via INTRAVENOUS

## 2012-05-22 MED ORDER — HYDROMORPHONE HCL PF 1 MG/ML IJ SOLN
INTRAMUSCULAR | Status: AC
  Filled 2012-05-22: qty 1

## 2012-05-22 MED ORDER — DIPHENHYDRAMINE HCL 12.5 MG/5ML PO ELIX: 12.5000 mg | ORAL_SOLUTION | Freq: Four times a day (QID) | ORAL | Status: DC | PRN

## 2012-05-22 MED ORDER — DEXAMETHASONE SODIUM PHOSPHATE 10 MG/ML IJ SOLN
INTRAMUSCULAR | Status: DC | PRN
  Administered 2012-05-22: 10 mg via INTRAVENOUS

## 2012-05-22 MED ORDER — METHOCARBAMOL 500 MG PO TABS: 500.0000 mg | ORAL_TABLET | Freq: Four times a day (QID) | ORAL | Status: DC | PRN

## 2012-05-22 MED ORDER — LEVOTHYROXINE SODIUM 125 MCG PO TABS
125.0000 ug | ORAL_TABLET | Freq: Every day | ORAL | Status: DC
  Administered 2012-05-23 – 2012-05-25 (×3): 125 ug via ORAL
  Filled 2012-05-22 (×3): qty 1

## 2012-05-22 MED ORDER — LACTATED RINGERS IV SOLN: INTRAVENOUS | Status: DC

## 2012-05-22 MED ORDER — PROPOFOL 10 MG/ML IV EMUL
INTRAVENOUS | Status: DC | PRN
  Administered 2012-05-22: 140 mg via INTRAVENOUS

## 2012-05-22 MED ORDER — BUPIVACAINE 0.25 % ON-Q PUMP SINGLE CATH 300ML
INJECTION | Status: DC | PRN
  Administered 2012-05-22: 300 mL

## 2012-05-22 MED ORDER — TORSEMIDE 5 MG PO TABS
5.0000 mg | ORAL_TABLET | Freq: Every day | ORAL | Status: DC
  Administered 2012-05-23: 5 mg via ORAL
  Filled 2012-05-22 (×4): qty 1

## 2012-05-22 MED ORDER — ACETAMINOPHEN 10 MG/ML IV SOLN
INTRAVENOUS | Status: DC | PRN
  Administered 2012-05-22: 1000 mg via INTRAVENOUS

## 2012-05-22 MED ORDER — MIDAZOLAM HCL 5 MG/ML IJ SOLN
0.5000 mg | Freq: Once | INTRAMUSCULAR | Status: AC
  Administered 2012-05-22: 0.5 mg via INTRAVENOUS

## 2012-05-22 MED ORDER — VANCOMYCIN HCL IN DEXTROSE 1-5 GM/200ML-% IV SOLN
1000.0000 mg | Freq: Once | INTRAVENOUS | Status: AC
  Administered 2012-05-22: 1000 mg via INTRAVENOUS

## 2012-05-22 MED ORDER — ACETAMINOPHEN 10 MG/ML IV SOLN
1000.0000 mg | Freq: Four times a day (QID) | INTRAVENOUS | Status: AC
  Administered 2012-05-22 – 2012-05-23 (×4): 1000 mg via INTRAVENOUS
  Filled 2012-05-22 (×7): qty 100

## 2012-05-22 MED ORDER — CHLORHEXIDINE GLUCONATE 4 % EX LIQD
60.0000 mL | Freq: Once | CUTANEOUS | Status: DC
  Filled 2012-05-22: qty 60

## 2012-05-22 MED ORDER — ACETAMINOPHEN 325 MG PO TABS
650.0000 mg | ORAL_TABLET | Freq: Four times a day (QID) | ORAL | Status: DC | PRN
  Administered 2012-05-24 – 2012-05-25 (×2): 650 mg via ORAL
  Filled 2012-05-22 (×2): qty 2

## 2012-05-22 MED ORDER — BUPIVACAINE ON-Q PAIN PUMP (FOR ORDER SET NO CHG)
INJECTION | Status: DC
  Filled 2012-05-22: qty 1

## 2012-05-22 MED ORDER — POLYETHYLENE GLYCOL 3350 17 G PO PACK
17.0000 g | PACK | Freq: Every day | ORAL | Status: DC
  Administered 2012-05-22 – 2012-05-25 (×4): 17 g via ORAL

## 2012-05-22 MED ORDER — SODIUM CHLORIDE 0.9 % IR SOLN
Status: DC | PRN
  Administered 2012-05-22: 3000 mL

## 2012-05-22 MED ORDER — HYDROMORPHONE HCL PF 1 MG/ML IJ SOLN
INTRAMUSCULAR | Status: DC | PRN
  Administered 2012-05-22: 1 mg via INTRAVENOUS
  Administered 2012-05-22: 0.5 mg via INTRAVENOUS
  Administered 2012-05-22 (×2): 1 mg via INTRAVENOUS
  Administered 2012-05-22: 0.5 mg via INTRAVENOUS

## 2012-05-22 MED ORDER — METOCLOPRAMIDE HCL 5 MG/ML IJ SOLN: 5.0000 mg | Freq: Three times a day (TID) | INTRAMUSCULAR | Status: DC | PRN

## 2012-05-22 MED ORDER — SODIUM CHLORIDE 0.9 % IJ SOLN: 9.0000 mL | INTRAMUSCULAR | Status: DC | PRN

## 2012-05-22 MED ORDER — MENTHOL 3 MG MT LOZG: 1.0000 | LOZENGE | OROMUCOSAL | Status: DC | PRN

## 2012-05-22 MED ORDER — METOCLOPRAMIDE HCL 10 MG PO TABS: 5.0000 mg | ORAL_TABLET | Freq: Three times a day (TID) | ORAL | Status: DC | PRN

## 2012-05-22 MED ORDER — ZOLPIDEM TARTRATE 5 MG PO TABS
5.0000 mg | ORAL_TABLET | Freq: Every evening | ORAL | Status: DC | PRN
  Administered 2012-05-22 – 2012-05-23 (×2): 5 mg via ORAL
  Filled 2012-05-22 (×2): qty 1

## 2012-05-22 MED ORDER — PHENOL 1.4 % MT LIQD: 1.0000 | OROMUCOSAL | Status: DC | PRN

## 2012-05-22 MED ORDER — MORPHINE SULFATE (PF) 1 MG/ML IV SOLN
INTRAVENOUS | Status: AC
  Filled 2012-05-22: qty 25

## 2012-05-22 MED ORDER — BISACODYL 10 MG RE SUPP: 10.0000 mg | Freq: Every day | RECTAL | Status: DC | PRN

## 2012-05-22 MED ORDER — RIVAROXABAN 10 MG PO TABS
10.0000 mg | ORAL_TABLET | Freq: Every day | ORAL | Status: DC
  Administered 2012-05-23 – 2012-05-25 (×3): 10 mg via ORAL
  Filled 2012-05-22 (×4): qty 1

## 2012-05-22 MED ORDER — LACTATED RINGERS IV SOLN
INTRAVENOUS | Status: DC | PRN
  Administered 2012-05-22: 07:00:00 via INTRAVENOUS
  Administered 2012-05-22: 1000 mL

## 2012-05-22 MED ORDER — NALOXONE HCL 0.4 MG/ML IJ SOLN: 0.4000 mg | INTRAMUSCULAR | Status: DC | PRN

## 2012-05-22 MED ORDER — METHOCARBAMOL 100 MG/ML IJ SOLN
500.0000 mg | Freq: Four times a day (QID) | INTRAVENOUS | Status: DC | PRN
  Administered 2012-05-22: 500 mg via INTRAVENOUS
  Filled 2012-05-22: qty 5

## 2012-05-22 MED ORDER — NEOSTIGMINE METHYLSULFATE 1 MG/ML IJ SOLN
INTRAMUSCULAR | Status: DC | PRN
  Administered 2012-05-22: 3 mg via INTRAVENOUS

## 2012-05-22 MED ORDER — ROCURONIUM BROMIDE 100 MG/10ML IV SOLN
INTRAVENOUS | Status: DC | PRN
  Administered 2012-05-22: 25 mg via INTRAVENOUS

## 2012-05-22 MED ORDER — HYDROMORPHONE HCL PF 1 MG/ML IJ SOLN
0.2500 mg | INTRAMUSCULAR | Status: DC | PRN
  Administered 2012-05-22 (×2): 0.5 mg via INTRAVENOUS

## 2012-05-22 MED ORDER — OXYCODONE HCL 5 MG PO TABS
5.0000 mg | ORAL_TABLET | ORAL | Status: DC | PRN
  Administered 2012-05-22: 15 mg via ORAL
  Administered 2012-05-22: 20 mg via ORAL
  Administered 2012-05-23 – 2012-05-24 (×3): 10 mg via ORAL
  Administered 2012-05-25 (×2): 20 mg via ORAL
  Filled 2012-05-22: qty 15
  Filled 2012-05-22: qty 4
  Filled 2012-05-22 (×2): qty 2
  Filled 2012-05-22: qty 4
  Filled 2012-05-22: qty 3
  Filled 2012-05-22: qty 2
  Filled 2012-05-22: qty 4

## 2012-05-22 MED ORDER — ACETAMINOPHEN 10 MG/ML IV SOLN
INTRAVENOUS | Status: AC
  Filled 2012-05-22: qty 100

## 2012-05-22 MED ORDER — POTASSIUM CHLORIDE CRYS ER 10 MEQ PO TBCR
10.0000 meq | EXTENDED_RELEASE_TABLET | Freq: Two times a day (BID) | ORAL | Status: DC
  Administered 2012-05-22 – 2012-05-24 (×6): 10 meq via ORAL
  Filled 2012-05-22 (×8): qty 1

## 2012-05-22 MED ORDER — ONDANSETRON HCL 4 MG/2ML IJ SOLN
INTRAMUSCULAR | Status: DC | PRN
  Administered 2012-05-22: 4 mg via INTRAVENOUS

## 2012-05-22 MED ORDER — FENTANYL CITRATE 0.05 MG/ML IJ SOLN
INTRAMUSCULAR | Status: DC | PRN
  Administered 2012-05-22 (×5): 50 ug via INTRAVENOUS

## 2012-05-22 SURGICAL SUPPLY — 56 items
BAG SPEC THK2 15X12 ZIP CLS (MISCELLANEOUS) ×1
BAG ZIPLOCK 12X15 (MISCELLANEOUS) ×2 IMPLANT
BANDAGE ELASTIC 6 VELCRO ST LF (GAUZE/BANDAGES/DRESSINGS) ×2 IMPLANT
BANDAGE ESMARK 6X9 LF (GAUZE/BANDAGES/DRESSINGS) ×1 IMPLANT
BLADE SAG 18X100X1.27 (BLADE) ×2 IMPLANT
BLADE SAW SGTL 11.0X1.19X90.0M (BLADE) ×2 IMPLANT
BNDG CMPR 9X6 STRL LF SNTH (GAUZE/BANDAGES/DRESSINGS) ×1
BNDG ESMARK 6X9 LF (GAUZE/BANDAGES/DRESSINGS) ×2
BOWL SMART MIX CTS (DISPOSABLE) ×2 IMPLANT
CATH KIT ON-Q SILVERSOAK 5 (CATHETERS) ×1 IMPLANT
CATH KIT ON-Q SILVERSOAK 5IN (CATHETERS) ×2 IMPLANT
CEMENT HV SMART SET (Cement) ×4 IMPLANT
CLOTH BEACON ORANGE TIMEOUT ST (SAFETY) ×2 IMPLANT
CLSR STERI-STRIP ANTIMIC 1/2X4 (GAUZE/BANDAGES/DRESSINGS) ×1 IMPLANT
CUFF TOURN SGL QUICK 34 (TOURNIQUET CUFF) ×2
CUFF TRNQT CYL 34X4X40X1 (TOURNIQUET CUFF) ×1 IMPLANT
DRAPE EXTREMITY T 121X128X90 (DRAPE) ×2 IMPLANT
DRAPE POUCH INSTRU U-SHP 10X18 (DRAPES) ×2 IMPLANT
DRAPE U-SHAPE 47X51 STRL (DRAPES) ×2 IMPLANT
DRSG ADAPTIC 3X8 NADH LF (GAUZE/BANDAGES/DRESSINGS) ×2 IMPLANT
DRSG EMULSION OIL 3X16 NADH (GAUZE/BANDAGES/DRESSINGS) ×1 IMPLANT
DRSG PAD ABDOMINAL 8X10 ST (GAUZE/BANDAGES/DRESSINGS) ×1 IMPLANT
DURAPREP 26ML APPLICATOR (WOUND CARE) ×2 IMPLANT
ELECT REM PT RETURN 9FT ADLT (ELECTROSURGICAL) ×2
ELECTRODE REM PT RTRN 9FT ADLT (ELECTROSURGICAL) ×1 IMPLANT
EVACUATOR 1/8 PVC DRAIN (DRAIN) ×2 IMPLANT
FACESHIELD LNG OPTICON STERILE (SAFETY) ×10 IMPLANT
GLOVE BIO SURGEON STRL SZ7.5 (GLOVE) ×2 IMPLANT
GLOVE BIO SURGEON STRL SZ8 (GLOVE) ×2 IMPLANT
GLOVE BIOGEL PI IND STRL 8 (GLOVE) ×2 IMPLANT
GLOVE BIOGEL PI INDICATOR 8 (GLOVE) ×2
GOWN STRL NON-REIN LRG LVL3 (GOWN DISPOSABLE) ×2 IMPLANT
GOWN STRL REIN XL XLG (GOWN DISPOSABLE) ×2 IMPLANT
HANDPIECE INTERPULSE COAX TIP (DISPOSABLE) ×2
IMMOBILIZER KNEE 20 (SOFTGOODS) ×2
IMMOBILIZER KNEE 20 THIGH 36 (SOFTGOODS) ×1 IMPLANT
KIT BASIN OR (CUSTOM PROCEDURE TRAY) ×2 IMPLANT
MANIFOLD NEPTUNE II (INSTRUMENTS) ×2 IMPLANT
NS IRRIG 1000ML POUR BTL (IV SOLUTION) ×2 IMPLANT
PACK TOTAL JOINT (CUSTOM PROCEDURE TRAY) ×2 IMPLANT
PAD ABD 7.5X8 STRL (GAUZE/BANDAGES/DRESSINGS) ×2 IMPLANT
PADDING CAST COTTON 6X4 STRL (CAST SUPPLIES) ×4 IMPLANT
POSITIONER SURGICAL ARM (MISCELLANEOUS) ×2 IMPLANT
SET HNDPC FAN SPRY TIP SCT (DISPOSABLE) ×1 IMPLANT
SPONGE GAUZE 4X4 12PLY (GAUZE/BANDAGES/DRESSINGS) ×2 IMPLANT
STRIP CLOSURE SKIN 1/2X4 (GAUZE/BANDAGES/DRESSINGS) ×4 IMPLANT
SUCTION FRAZIER 12FR DISP (SUCTIONS) ×2 IMPLANT
SUT MNCRL AB 4-0 PS2 18 (SUTURE) ×2 IMPLANT
SUT PDS AB 1 CT1 27 (SUTURE) ×6 IMPLANT
SUT VIC AB 2-0 CT1 27 (SUTURE) ×6
SUT VIC AB 2-0 CT1 TAPERPNT 27 (SUTURE) ×3 IMPLANT
SUT VLOC 180 0 24IN GS25 (SUTURE) ×2 IMPLANT
TOWEL OR 17X26 10 PK STRL BLUE (TOWEL DISPOSABLE) ×4 IMPLANT
TRAY FOLEY CATH 14FRSI W/METER (CATHETERS) ×2 IMPLANT
WATER STERILE IRR 1500ML POUR (IV SOLUTION) ×2 IMPLANT
WRAP KNEE MAXI GEL POST OP (GAUZE/BANDAGES/DRESSINGS) ×4 IMPLANT

## 2012-05-22 NOTE — Op Note (Signed)
Pre-operative diagnosis- Osteoarthritis  Left knee(s)  Post-operative diagnosis- Osteoarthritis Left knee(s)  Procedure-  Left  Total Knee Arthroplasty  Surgeon- Gus Rankin. [place] Capers, MD  Assistant- Dimitri Ped, PA-C   Anesthesia-  General EBL-* No blood loss amount entered *  Drains Hemovac  Tourniquet time- 29 minutes @ 300 mm HG Complications- None  Condition-PACU - hemodynamically stable.   Brief Clinical Note  Sonya Cordova is a 66 y.o. year old female with end stage OA of her left knee with progressively worsening pain and dysfunction. She has constant pain, with activity and at rest and significant functional deficits with difficulties even with ADLs. She has had extensive non-op management including analgesics, injections of cortisone and viscosupplements, and home exercise program, but remains in significant pain with significant dysfunction. Radiographs show bone on bone arthritis all 3 compartments. She presents now for left Total Knee Arthroplasty.    Procedure in detail---   The patient is brought into the operating room and positioned supine on the operating table. After successful administration of  General,   a tourniquet is placed high on the  Left thigh(s) and the lower extremity is prepped and draped in the usual sterile fashion. Time out is performed by the operating team and then the  Left lower extremity is wrapped in Esmarch, knee flexed and the tourniquet inflated to 300 mmHg.       A midline incision is made with a ten blade through the subcutaneous tissue to the level of the extensor mechanism. A fresh blade is used to make a medial parapatellar arthrotomy. Soft tissue over the proximal medial tibia is subperiosteally elevated to the joint line with a knife and into the semimembranosus bursa with a Cobb elevator. Soft tissue over the proximal lateral tibia is elevated with attention being paid to avoiding the patellar tendon on the tibial tubercle. The patella is  everted, knee flexed 90 degrees and the ACL and PCL are removed. Findings are various stages of bone on bone arthritis all 3 compartments with osteophyte formation.        The drill is used to create a starting hole in the distal femur and the canal is thoroughly irrigated with sterile saline to remove the fatty contents. The 5 degree Left  valgus alignment guide is placed into the femoral canal and the distal femoral cutting block is pinned to remove 10 mm off the distal femur. Resection is made with an oscillating saw.      The tibia is subluxed forward and the menisci are removed. The extramedullary alignment guide is placed referencing proximally at the medial aspect of the tibial tubercle and distally along the second metatarsal axis and tibial crest. The block is pinned to remove 2mm off the more deficient medial  side. Resection is made with an oscillating saw. Size 3is the most appropriate size for the tibia and the proximal tibia is prepared with the modular drill and keel punch for that size.      The femoral sizing guide is placed and size 4 narrow is most appropriate. Rotation is marked off the epicondylar axis and confirmed by creating a rectangular flexion gap at 90 degrees. The size 4 cutting block is pinned in this rotation and the anterior, posterior and chamfer cuts are made with the oscillating saw. The intercondylar block is then placed and that cut is made.      Trial size 3 tibial component, trial size 4 narrow posterior stabilized femur and a 12.5  mm  posterior stabilized rotating platform insert trial is placed. Full extension is achieved with excellent varus/valgus and anterior/posterior balance throughout full range of motion. The patella is everted and thickness measured to be 22  mm. Free hand resection is taken to 12 mm, a 38 template is placed, lug holes are drilled, trial patella is placed, and it tracks normally. Osteophytes are removed off the posterior femur with the trial in  place. All trials are removed and the cut bone surfaces prepared with pulsatile lavage. Cement is mixed and once ready for implantation, the size 3 tibial implant, size  4 narrow posterior stabilized femoral component, and the size 38 patella are cemented in place and the patella is held with the clamp. The trial insert is placed and the knee held in full extension. All extruded cement is removed and once the cement is hard the permanent 12.5 mm posterior stabilized rotating platform insert is placed into the tibial tray.      The wound is copiously irrigated with saline solution and the extensor mechanism closed over a hemovac drain with #1 PDS suture. The tourniquet is released for a total tourniquet time of 29  minutes. Flexion against gravity is 140 degrees and the patella tracks normally. Subcutaneous tissue is closed with 2.0 vicryl and subcuticular with running 4.0 Monocryl. The catheter for the Marcaine pain pump is placed and the pump is initiated. The incision is cleaned and dried and steri-strips and a bulky sterile dressing are applied. The limb is placed into a knee immobilizer and the patient is awakened and transported to recovery in stable condition.      Please note that a surgical assistant was a medical necessity for this procedure in order to perform it in a safe and expeditious manner. Surgical assistant was necessary to retract the ligaments and vital neurovascular structures to prevent injury to them and also necessary for proper positioning of the limb to allow for anatomic placement of the prosthesis.   Gus Rankin Boe Deans, MD    05/22/2012, 8:37 AM

## 2012-05-22 NOTE — Progress Notes (Signed)
Utilization review completed.  

## 2012-05-22 NOTE — Preoperative (Signed)
Beta Blockers   Reason not to administer Beta Blockers:Not Applicable 

## 2012-05-22 NOTE — Transfer of Care (Signed)
Immediate Anesthesia Transfer of Care Note  Patient: Sonya Cordova  Procedure(s) Performed: Procedure(s) (LRB): TOTAL KNEE ARTHROPLASTY (Left)  Patient Location: PACU  Anesthesia Type: General  Level of Consciousness: sedated, patient cooperative and responds to stimulaton  Airway & Oxygen Therapy: Patient Spontanous Breathing and Patient connected to face mask oxgen  Post-op Assessment: Report given to PACU RN and Post -op Vital signs reviewed and stable  Post vital signs: Reviewed and stable  Complications: No apparent anesthesia complications

## 2012-05-22 NOTE — H&P (View-Only) (Signed)
Sonya Cordova  DOB: 02/02/1946 Married / Language: English / Race: White Female  Date of Admission:  05/22/2012  Chief complaint:  Left knee pain  History of Present Illness The patient is a 66 year old female who comes in today for a preoperative History and Physical. The patient is scheduled for a left total knee arthroplasty to be performed by Dr. Frank V. Aluisio, MD at [facility] on 05/22/2012. The patient is a 66 year old female who presents today for follow up of their knee. The patient is being followed for their left knee pain and osteoarthritis. Symptoms reported today include: pain and swelling. The patient feels that they are doing poorly and report their pain level to be moderate to severe. The following medication has been used for pain control: Morphine and Arthrotec. The patient has not gotten any relief of their symptoms with Cortisone injections or viscosupplementation. The patient indicates that they have questions or concerns today regarding total knee arthroplasty. She is scheduled for 05/22/12. She states that the left knee bothers her more than the right knee ever did before she had that replaced. We have been treating this for several years now with cortisone and visco supplements. Nothing is working anymore. She is at a stage now where she is ready to go ahead and get it fixed. They have been treated conservatively in the past for the above stated problem and despite conservative measures, they continue to have progressive pain and severe functional limitations and dysfunction. They have failed non-operative management including home exercise, medications, and injections. It is felt that they would benefit from undergoing total joint replacement. Risks and benefits of the procedure have been discussed with the patient and they elect to proceed with surgery. There are no active contraindications to surgery such as ongoing infection or rapidly progressive  neurological disease.     Allergies Cleocin *ANTI-INFECTIVE AGENTS - MISC.*. Respiratory distress. Codeine Sulfate *ANALGESICS - OPIOID*. Nausea. Patient states that she has taken Vicodin in the past and did OK with it. Augmentin *PENICILLINS*. Hives.   Family History Hypertension. mother, father, sister and brother Heart disease in female family member before age 65 Rheumatoid Arthritis. mother Osteoarthritis. mother, father, sister and brother Cerebrovascular Accident. father and sister Cancer. mother Heart Disease. father and sister Depression. mother   Social History Marital status. married Living situation. live with spouse Illicit drug use. no Tobacco use. Never smoker. former smoker; smoke(d) 2 ppd for 35 years Tobacco / smoke exposure. no Number of flights of stairs before winded. 2-3 Current work status. disabled Children. 2 Alcohol use. Never consumed alcohol. former drinker Exercise. Exercises daily; does other Drug/Alcohol Rehab (Previously). no Drug/Alcohol Rehab (Currently). no Current occupation. RN, disabled   Medication History Morphine Sulfate (30MG Tablet, Oral) Active. Lunesta ( Oral) Specific dose unknown - Active. Estradiol (2MG Tablet, Oral) Active. Synthroid (100MCG Tablet, Oral) Active. Arthrotec (75-200MG-MCG Tablet, Oral) Active. Methocarbamol (500MG Tablet, Oral) Active. Gabapentin (100MG Capsule, Oral) Active. Torsemide (20MG Tablet, Oral) Active. MiraLax ( Oral) Active. Stool Softener (100MG Capsule, Oral) Active.   Past Surgical History Spinal Surgery Straighten Nasal Septum Spinal Decompression. lower back Spinal Fusion. neck and lower back Total Knee Replacement. right Tubal Ligation Thyroidectomy; Subtotal Tonsillectomy Dilation and Curettage of Uterus Hemorrhoidectomy Appendectomy Arthroscopy of Knee. right Sinus Surgery Hysterectomy. complete (non-cancerous) Neck Disc  Surgery   Past Medical history Osteoarthritis Peripheral Neuropathy Migraine Headache Connective Tissue Disease Chronic Pain Hypothyroidism Depression Tinnitus Hemorrhoids Urinary Incontinence Osteopenia Degenerative Disc Disease Bursitis   Measles Mumps Nonspecific Connective Tissue Disorder   Review of Systems General:Present- Fatigue. Not Present- Chills, Fever, Night Sweats, Weight Gain, Weight Loss and Memory Loss. Skin:Not Present- Hives, Itching, Rash, Eczema and Lesions. HEENT:Present- Headache. Not Present- Tinnitus, Double Vision, Visual Loss, Hearing Loss and Dentures. Respiratory:Not Present- Shortness of breath with exertion, Shortness of breath at rest, Allergies, Coughing up blood and Chronic Cough. Cardiovascular:Not Present- Chest Pain, Racing/skipping heartbeats, Difficulty Breathing Lying Down, Murmur, Swelling and Palpitations. Gastrointestinal:Not Present- Bloody Stool, Heartburn, Abdominal Pain, Vomiting, Nausea, Constipation, Diarrhea, Difficulty Swallowing, Jaundice and Loss of appetitie. Female Genitourinary:Present- Urinating at Night. Not Present- Blood in Urine, Urinary frequency, Weak urinary stream, Discharge, Flank Pain, Incontinence, Painful Urination, Urgency and Urinary Retention. Musculoskeletal:Present- Muscle Weakness, Joint Swelling, Joint Pain, Back Pain, Morning Stiffness and Spasms. Not Present- Muscle Pain. Neurological:Not Present- Tremor, Dizziness, Blackout spells, Paralysis, Difficulty with balance and Weakness. Psychiatric:Present- Insomnia.   Vitals Weight: 140 lb Height: 67 in Body Surface Area: 1.73 m Body Mass Index: 21.93 kg/m Pulse: 68 (Regular) Resp.: 16 (Unlabored) BP: 128/72 (Sitting, Left Arm, Standard)    Physical Exam The physical exam findings are as follows:  Note: Patient is a 66 year old female with continued knee pain.   General Mental Status - Alert, cooperative and good  historian. General Appearance- pleasant. Not in acute distress. Orientation- Oriented X3. Build & Nutrition- Well nourished and Well developed.   Head and Neck Head- normocephalic, atraumatic . Neck Global Assessment- supple. no bruit auscultated on the right and no bruit auscultated on the left.   Eye Pupil- Bilateral- Regular and Round. Motion- Bilateral- EOMI.   Note: wears contacts  Chest and Lung Exam Auscultation: Breath sounds:- clear at anterior chest wall and - clear at posterior chest wall. Adventitious sounds:- No Adventitious sounds.   Cardiovascular Auscultation:Rhythm- Regular rate and rhythm. Heart Sounds- S1 WNL and S2 WNL. Murmurs & Other Heart Sounds:Auscultation of the heart reveals - No Murmurs.   Abdomen Palpation/Percussion:Tenderness- Abdomen is non-tender to palpation. Rigidity (guarding)- Abdomen is soft. Auscultation:Auscultation of the abdomen reveals - Bowel sounds normal.   Note: Spine stimulator is located in the right lower abdominal quadrant.  Female Genitourinary Not done, not pertinent to present illness  Musculoskeletal On exam well developed female alert and oriented in no apparent distress. Left knee shows slight varus deformity. Range 10 to 125. Marked crepitus on range of motion. Tenderness medial greater than lateral with no instability noted.  RADIOGRAPHS: Radiographs taken today AP and lateral of the knee show her prosthesis on the right in good position with no abnormalities. On the left she has bone on bone arthritis in the medial and patellofemoral compartments, tibial subluxation.  Assessment & Plan Osteoarthritis, Knee (715.96) Impression: Left Knee  Note: Patient is for a Left Total Knee Replacement by Dr. Aluisio.  Plan to go home versus SNF depending upon progress.  Patient has had problems with nausea and vomiting with previous anesthesia in the past.  Signed electronically by DREW L  PERKINS, PA-C  

## 2012-05-22 NOTE — Interval H&P Note (Signed)
History and Physical Interval Note:  05/22/2012 7:08 AM  Ellis Parents  has presented today for surgery, with the diagnosis of Osteoarthritis of the Left Knee  The various methods of treatment have been discussed with the patient and family. After consideration of risks, benefits and other options for treatment, the patient has consented to  Procedure(s) (LRB): TOTAL KNEE ARTHROPLASTY (Left) as a surgical intervention .  The patient's history has been reviewed, patient examined, no change in status, stable for surgery.  I have reviewed the patient's chart and labs.  Questions were answered to the patient's satisfaction.     Loanne Drilling

## 2012-05-23 DIAGNOSIS — D62 Acute posthemorrhagic anemia: Secondary | ICD-10-CM | POA: Diagnosis not present

## 2012-05-23 LAB — CBC
HCT: 25.2 % — ABNORMAL LOW (ref 36.0–46.0)
Hemoglobin: 8.4 g/dL — ABNORMAL LOW (ref 12.0–15.0)
MCH: 31.3 pg (ref 26.0–34.0)
MCHC: 33.3 g/dL (ref 30.0–36.0)
MCV: 94 fL (ref 78.0–100.0)
Platelets: 126 10*3/uL — ABNORMAL LOW (ref 150–400)
RBC: 2.68 MIL/uL — ABNORMAL LOW (ref 3.87–5.11)
RDW: 12.8 % (ref 11.5–15.5)
WBC: 6.9 10*3/uL (ref 4.0–10.5)

## 2012-05-23 LAB — HEMOGLOBIN AND HEMATOCRIT, BLOOD
HCT: 32.9 % — ABNORMAL LOW (ref 36.0–46.0)
Hemoglobin: 11.1 g/dL — ABNORMAL LOW (ref 12.0–15.0)

## 2012-05-23 LAB — BASIC METABOLIC PANEL
BUN: 12 mg/dL (ref 6–23)
CO2: 30 mEq/L (ref 19–32)
Calcium: 8.2 mg/dL — ABNORMAL LOW (ref 8.4–10.5)
Chloride: 102 mEq/L (ref 96–112)
Creatinine, Ser: 0.66 mg/dL (ref 0.50–1.10)
GFR calc Af Amer: >90 mL/min (ref >90)
GFR calc non Af Amer: >90 mL/min (ref >90)
Glucose, Bld: 133 mg/dL — ABNORMAL HIGH (ref 70–99)
Potassium: 3.8 mEq/L (ref 3.5–5.1)
Sodium: 137 mEq/L (ref 135–145)

## 2012-05-23 MED ORDER — BUPIVACAINE ON-Q PAIN PUMP (FOR ORDER SET NO CHG)
INJECTION | Status: DC
  Filled 2012-05-23: qty 1

## 2012-05-23 MED ORDER — FUROSEMIDE 10 MG/ML IJ SOLN
10.0000 mg | Freq: Once | INTRAMUSCULAR | Status: AC
  Administered 2012-05-23: 10 mg via INTRAVENOUS
  Filled 2012-05-23: qty 1

## 2012-05-23 NOTE — Progress Notes (Signed)
Clinical Social Work Department CLINICAL SOCIAL WORK PLACEMENT NOTE 05/23/2012  Patient:  Sonya Cordova, Sonya Cordova  Account Number:  [id number] Admit date:  05/22/2012  Clinical Social Worker:  ,  Date/time:  05/23/2012 03:40 PM  Clinical Social Work is seeking post-discharge placement for this patient at the following level of care:   SKILLED NURSING   (*CSW will update this form in Epic as items are completed)   05/23/2012  Patient/family provided with [facility] Department of Clinical Social Work's list of facilities offering this level of care within the geographic area requested by the patient (or if unable, by the patient's family).  05/23/2012  Patient/family informed of their freedom to choose among providers that offer the needed level of care, that participate in Medicare, Medicaid or managed care program needed by the patient, have an available bed and are willing to accept the patient.    Patient/family informed of [facility]' ownership interest in [facility], as well as of the fact that they are under no obligation to receive care at this facility.  PASARR submitted to EDS on 05/23/2012 PASARR number received from EDS on   FL2 transmitted to all facilities in geographic area requested by pt/family on  05/23/2012 FL2 transmitted to all facilities within larger geographic area on   Patient informed that his/her managed care company has contracts with or will negotiate with  certain facilities, including the following:     Patient/family informed of bed offers received:   Patient chooses bed at  Physician recommends and patient chooses bed at    Patient to be transferred to  on   Patient to be transferred to facility by   The following physician request were entered in Epic:   Additional Comments:  Cori Razor LCSW [phone]

## 2012-05-23 NOTE — Progress Notes (Signed)
   Subjective: 1 Day Post-Op Procedure(s) (LRB): TOTAL KNEE ARTHROPLASTY (Left) Patient reports pain as moderate.   Patient seen in rounds with Dr. Lequita Halt. Patient is well, and has had no acute complaints or problems other than pain. She denies chest pain and shortness of breath. She reports that her home medication of morphine 30mg  controlled her pain better than the PCA. She is planning on going to SNF after hospital stay and requests [facility]. We will start therapy today.  Plan is to go Skilled nursing facility after hospital stay.  Objective: Vital signs in last 24 hours: Temp:  [94.3 F (34.6 C)-98.5 F (36.9 C)] 98.5 F (36.9 C) (08/20 0551) Pulse Rate:  [46-63] 62  (08/20 0551) Resp:  [9-20] 11  (08/20 0551) BP: (90-149)/(24-101) 100/59 mmHg (08/20 0551) SpO2:  [97 %-100 %] 99 % (08/20 0551) Weight:  [61.689 kg (136 lb)-62.1 kg (136 lb 14.5 oz)] 62.1 kg (136 lb 14.5 oz) (08/19 1338)  Intake/Output from previous day:  Intake/Output Summary (Last 24 hours) at 05/23/12 0816 Last data filed at 05/23/12 0750  Gross per 24 hour  Intake 3257.5 ml  Output   2070 ml  Net 1187.5 ml    Intake/Output this shift: Total I/O In: 360 [P.O.:360] Out: -   Labs:  Basename 05/23/12 0420  HGB 8.4*    Basename 05/23/12 0420  WBC 6.9  RBC 2.68*  HCT 25.2*  PLT 126*    Basename 05/23/12 0420  NA 137  K 3.8  CL 102  CO2 30  BUN 12  CREATININE 0.66  GLUCOSE 133*  CALCIUM 8.2*    EXAM General - Patient is Alert and Oriented Extremity - Neurologically intact Intact pulses distally Dorsiflexion/Plantar flexion intact Dressing - dressing C/D/I Motor Function - intact, moving foot and toes well on exam.  Hemovac pulled without difficulty.  Past Medical History  Diagnosis Date  . Degenerative disc disease 1995  . Arthritis   . Connective tissue disease   . Osteopenia 02/2012    t score -1.1  . Family history of anesthesia complication   . Complication of  anesthesia     "states remembers surgeon talking- was light"  . Hypothyroidism   . Depression   . Edema of both legs   . Headache     migraines  . History of blood transfusion   . Anemia     Assessment/Plan: 1 Day Post-Op Procedure(s) (LRB): TOTAL KNEE ARTHROPLASTY (Left) Principal Problem:  *OA (osteoarthritis) of knee Postoperative acute blood loss anemia   Advance diet Up with therapy Continue foley due to blood transfusion; will continue until blood transfusion complete Discharge to SNF [facility])  tentative Thursday DVT Prophylaxis - Xarelto Weight-Bearing as tolerated to left leg No vaccines. D/C PCA D/C O2 and Pulse OX and try on Room Air Hgb is 8.4 and BP 100/59. Will transfuse 2 units with Lasix to follow. Perform therapy after transfusion if possible. Continue CPM.   Fordyce Lepak LAUREN 05/23/2012, 8:16 AM

## 2012-05-23 NOTE — Progress Notes (Signed)
Clinical Social Work Department BRIEF PSYCHOSOCIAL ASSESSMENT 05/23/2012  Patient:  Sonya Cordova, Sonya Cordova     Account Number:  [id number]     Admit date:  05/22/2012  Clinical Social Worker:  Candie Chroman  Date/Time:  05/23/2012 03:34 PM  Referred by:  Physician  Date Referred:  05/23/2012 Referred for  SNF Placement   Other Referral:   Interview type:  Patient Other interview type:    PSYCHOSOCIAL DATA Living Status:  HUSBAND Admitted from facility:   Level of care:   Primary support name:  Sonya Cordova Primary support relationship to patient:  SPOUSE Degree of support available:   supportive    CURRENT CONCERNS Current Concerns  Post-Acute Placement   Other Concerns:    SOCIAL WORK ASSESSMENT / PLAN Pt is a 66 yr old female living at home prior to hospitalization. CSW met with pt to assist with d/c planning. ST SNF is needed  following hospital d/c. Pt requesting [facility]. SNF contacted and able to offer ST SNF placement. CSW will follow to assist with d/c planning to SNF.   Assessment/plan status:  Psychosocial Support/Ongoing Assessment of Needs Other assessment/ plan:   Information/referral to community resources:   SNF placement list.    PATIENT'S/FAMILY'S RESPONSE TO PLAN OF CARE: Pt feels ST rehab will be helpful prior to returning home.   Sonya Muir Thuy Atilano LCSW [phone]

## 2012-05-23 NOTE — Progress Notes (Signed)
Physical Therapy Treatment Patient Details Name: Sonya Cordova MRN: [id number] DOB: 31-Dec-1945 Today's Date: 05/23/2012 Time: 0865-7846 PT Time Calculation (min): 23 min  PT Assessment / Plan / Recommendation Comments on Treatment Session       Follow Up Recommendations  Skilled nursing facility    Barriers to Discharge        Equipment Recommendations  Defer to next venue    Recommendations for Other Services OT consult  Frequency 7X/week   Plan Discharge plan remains appropriate    Precautions / Restrictions Precautions Precautions: Knee Required Braces or Orthoses: Knee Immobilizer - Left Knee Immobilizer - Left: Discontinue once straight leg raise with < 10 degree lag Restrictions Weight Bearing Restrictions: No Other Position/Activity Restrictions: WBAT   Pertinent Vitals/Pain     Mobility  Bed Mobility Bed Mobility: Sit to Supine Sit to Supine: 4: Min assist Details for Bed Mobility Assistance: cues for sequence and use of R LE to self assist Transfers Transfers: Sit to Stand;Stand to Sit Sit to Stand: 4: Min assist Stand to Sit: 4: Min assist Details for Transfer Assistance: cues for use of UEs and for LE management Ambulation/Gait Ambulation/Gait Assistance: 4: Min assist Ambulation Distance (Feet): 77 Feet Assistive device: Rolling walker Ambulation/Gait Assistance Details: min cues for stride length, posture and position from RW Gait Pattern: Step-to pattern    Exercises     PT Diagnosis:    PT Problem List:   PT Treatment Interventions:     PT Goals Acute Rehab PT Goals PT Goal Formulation: With patient Time For Goal Achievement: 05/30/12 Potential to Achieve Goals: Good Pt will go Supine/Side to Sit: with supervision PT Goal: Supine/Side to Sit - Progress: Goal set today Pt will go Sit to Supine/Side: with supervision PT Goal: Sit to Supine/Side - Progress: Goal set today Pt will go Sit to Stand: with supervision PT Goal: Sit to Stand -  Progress: Progressing toward goal Pt will go Stand to Sit: with supervision PT Goal: Stand to Sit - Progress: Progressing toward goal Pt will Ambulate: 51 - 150 feet;with supervision;with rolling walker PT Goal: Ambulate - Progress: Progressing toward goal  Visit Information  Last PT Received On: 05/23/12 Assistance Needed: +1    Subjective Data  Subjective: None of that pain medicine works because I take so much at home Patient Stated Goal: Resume previous lifestyle with decreased pain   Cognition  Overall Cognitive Status: Appears within functional limits for tasks assessed/performed Arousal/Alertness: Awake/alert Orientation Level: Appears intact for tasks assessed Behavior During Session: [facility] for tasks performed    Balance     End of Session PT - End of Session Equipment Utilized During Treatment: Left knee immobilizer Activity Tolerance: Patient tolerated treatment well Patient left: with call bell/phone within reach;with nursing in room;in bed Nurse Communication: Mobility status CPM Left Knee CPM Left Knee: On   GP     Yamaira Spinner 05/23/2012, 3:31 PM

## 2012-05-23 NOTE — Evaluation (Signed)
Physical Therapy Evaluation Patient Details Name: CELEST REITZ MRN: [id number] DOB: 09/28/1946 Today's Date: 05/23/2012 Time: 4098-1191 PT Time Calculation (min): 42 min  PT Assessment / Plan / Recommendation Clinical Impression  Pt with L TKR presents with decreased L LE strength/ROM and limitations in functional mobitliy    PT Assessment  Patient needs continued PT services    Follow Up Recommendations  Skilled nursing facility    Barriers to Discharge Decreased caregiver support      Equipment Recommendations  Defer to next venue    Recommendations for Other Services OT consult   Frequency 7X/week    Precautions / Restrictions Precautions Precautions: Knee Required Braces or Orthoses: Knee Immobilizer - Left Knee Immobilizer - Left: Discontinue once straight leg raise with < 10 degree lag Restrictions Weight Bearing Restrictions: No Other Position/Activity Restrictions: WBAT   Pertinent Vitals/Pain 6/10 with activity      Mobility  Bed Mobility Bed Mobility: Supine to Sit Supine to Sit: 3: Mod assist Details for Bed Mobility Assistance: cues for sequence and use of R LE to self assist Transfers Transfers: Sit to Stand;Stand to Sit Sit to Stand: 4: Min assist;3: Mod assist Stand to Sit: 4: Min assist;3: Mod assist Details for Transfer Assistance: cues for use of UEs and for LE management Ambulation/Gait Ambulation/Gait Assistance: 4: Min assist Ambulation Distance (Feet): 28 Feet Assistive device: Rolling walker Ambulation/Gait Assistance Details: cues for sequence, posture and position from RW Gait Pattern: Step-to pattern    Exercises Total Joint Exercises Ankle Circles/Pumps: AROM;Both;10 reps;Supine Quad Sets: AROM;Both;10 reps;Supine Heel Slides: AAROM;10 reps;Left;Supine Straight Leg Raises: AAROM;Left;10 reps;Supine   PT Diagnosis: Difficulty walking  PT Problem List: Decreased strength;Decreased range of motion;Decreased activity  tolerance;Decreased mobility;Decreased knowledge of use of DME;Pain PT Treatment Interventions: DME instruction;Gait training;Stair training;Functional mobility training;Therapeutic activities;Therapeutic exercise;Patient/family education   PT Goals Acute Rehab PT Goals PT Goal Formulation: With patient Time For Goal Achievement: 05/30/12 Potential to Achieve Goals: Good Pt will go Supine/Side to Sit: with supervision PT Goal: Supine/Side to Sit - Progress: Goal set today Pt will go Sit to Supine/Side: with supervision PT Goal: Sit to Supine/Side - Progress: Goal set today Pt will go Sit to Stand: with supervision PT Goal: Sit to Stand - Progress: Goal set today Pt will go Stand to Sit: with supervision PT Goal: Stand to Sit - Progress: Goal set today Pt will Ambulate: 51 - 150 feet;with supervision;with rolling walker PT Goal: Ambulate - Progress: Goal set today  Visit Information  Last PT Received On: 05/23/12 Assistance Needed: +2    Subjective Data  Subjective: I've had 17 surgeries on by back and neck Patient Stated Goal: Resume previous lifestyle with decreased pain   Prior Functioning  Home Living Lives With: Spouse (works full time) Prior Function Level of Independence: Independent Able to Take Stairs?: Yes Vocation: Retired Musician: No difficulties    Cognition  Overall Cognitive Status: Appears within functional limits for tasks assessed/performed Arousal/Alertness: Awake/alert Orientation Level: Appears intact for tasks assessed Behavior During Session: [facility] for tasks performed    Extremity/Trunk Assessment Right Upper Extremity Assessment RUE ROM/Strength/Tone: [facility] for tasks assessed Left Upper Extremity Assessment LUE ROM/Strength/Tone: WFL for tasks assessed Right Lower Extremity Assessment RLE ROM/Strength/Tone: [facility] for tasks assessed Left Lower Extremity Assessment LLE ROM/Strength/Tone: Deficits LLE ROM/Strength/Tone Deficits:  AAROM at knee -10 - 40 with 2/5 quads   Balance    End of Session PT - End of Session Equipment Utilized During Treatment: Left knee immobilizer  Activity Tolerance: Patient tolerated treatment well Patient left: in chair;with call bell/phone within reach;with nursing in room Nurse Communication: Mobility status CPM Left Knee CPM Left Knee: Off  GP     Verity Gilcrest 05/23/2012, 12:33 PM

## 2012-05-24 ENCOUNTER — Encounter (HOSPITAL_COMMUNITY): Payer: Self-pay | Admitting: Orthopedic Surgery

## 2012-05-24 LAB — CBC
HCT: 32.6 % — ABNORMAL LOW (ref 36.0–46.0)
Hemoglobin: 11.2 g/dL — ABNORMAL LOW (ref 12.0–15.0)
MCH: 31.5 pg (ref 26.0–34.0)
MCHC: 34.4 g/dL (ref 30.0–36.0)
MCV: 91.6 fL (ref 78.0–100.0)
Platelets: 123 10*3/uL — ABNORMAL LOW (ref 150–400)
RBC: 3.56 MIL/uL — ABNORMAL LOW (ref 3.87–5.11)
RDW: 13.3 % (ref 11.5–15.5)
WBC: 7.4 10*3/uL (ref 4.0–10.5)

## 2012-05-24 LAB — TYPE AND SCREEN
ABO/RH(D): B POS
Antibody Screen: NEGATIVE
Unit division: 0
Unit division: 0

## 2012-05-24 LAB — BASIC METABOLIC PANEL
BUN: 12 mg/dL (ref 6–23)
CO2: 32 mEq/L (ref 19–32)
Calcium: 8.3 mg/dL — ABNORMAL LOW (ref 8.4–10.5)
Chloride: 100 mEq/L (ref 96–112)
Creatinine, Ser: 0.64 mg/dL (ref 0.50–1.10)
GFR calc Af Amer: >90 mL/min (ref >90)
GFR calc non Af Amer: >90 mL/min (ref >90)
Glucose, Bld: 108 mg/dL — ABNORMAL HIGH (ref 70–99)
Potassium: 3.5 mEq/L (ref 3.5–5.1)
Sodium: 136 mEq/L (ref 135–145)

## 2012-05-24 MED ORDER — NON FORMULARY: 3.0000 mg | Freq: Every day | Status: DC

## 2012-05-24 MED ORDER — ESZOPICLONE 3 MG PO TABS
3.0000 mg | ORAL_TABLET | Freq: Every day | ORAL | Status: DC
  Administered 2012-05-24: 3 mg via ORAL

## 2012-05-24 NOTE — Evaluation (Signed)
Occupational Therapy Evaluation Patient Details Name: Sonya Cordova MRN: [id number] DOB: September 01, 1946 Today's Date: 05/24/2012 Time: 4098-1191 OT Time Calculation (min): 23 min  OT Assessment / Plan / Recommendation Clinical Impression  This 66 year old female was admitted for R TKA. She has had multiple orthopedic surgeries in the past, including back rod which limits bending.  She currently needs min A for transfers and max a for LB dressing and will benefit from continued OT.  Goals are for min guard to min A .    OT Assessment  Patient needs continued OT Services    Follow Up Recommendations  Skilled nursing facility    Barriers to Discharge      Equipment Recommendations  Defer to next venue    Recommendations for Other Services    Frequency  Min 2X/week    Precautions / Restrictions Precautions Required Braces or Orthoses: Knee Immobilizer - Left Knee Immobilizer - Left: Discontinue once straight leg raise with < 10 degree lag Restrictions Other Position/Activity Restrictions: WBAT   Pertinent Vitals/Pain 8/10: R knee. Pt adjusted spinal stimulator, was premedicated, repositioned, and ice provided    ADL  Eating/Feeding: Simulated;Independent Where Assessed - Eating/Feeding: Chair Grooming: Performed;Wash/dry hands;Set up Where Assessed - Grooming: Supported sitting Upper Body Bathing: Simulated;Set up Where Assessed - Upper Body Bathing: Supported sitting Lower Body Bathing: Simulated;Moderate assistance Where Assessed - Lower Body Bathing: Supported sit to stand Upper Body Dressing: Performed;Minimal assistance (lines) Where Assessed - Upper Body Dressing: Unsupported sitting Lower Body Dressing: Simulated;Maximal assistance (with reacher) Where Assessed - Lower Body Dressing: Supported sit to stand Toilet Transfer: Performed;Minimal assistance Toilet Transfer Method: Sit to Barista: Raised toilet seat with arms (or 3-in-1 over  toilet) Toileting - Clothing Manipulation and Hygiene: Simulated;Set up Where Assessed - Toileting Clothing Manipulation and Hygiene: Lean right and/or left;Sit on 3-in-1 or toilet Equipment Used: Knee Immobilizer;Rolling walker Transfers/Ambulation Related to ADLs: ambulated into bathroom ADL Comments: pt has a reacher due to back surgeries.  If she had to put socks on, she would stand on one foot and put one on commode to "lasso" sock on.      OT Diagnosis: Generalized weakness  OT Problem List: Decreased strength;Decreased activity tolerance;Decreased knowledge of use of DME or AE OT Treatment Interventions: Self-care/ADL training;DME and/or AE instruction;Patient/family education   OT Goals Acute Rehab OT Goals OT Goal Formulation: With patient Time For Goal Achievement: 05/31/12 Potential to Achieve Goals: Good ADL Goals Pt Will Perform Grooming: with supervision;Standing at sink ADL Goal: Grooming - Progress: Goal set today Pt Will Perform Lower Body Bathing: with min assist;Sit to stand from chair;with adaptive equipment (min guard) ADL Goal: Lower Body Bathing - Progress: Goal set today Pt Will Perform Lower Body Dressing: with min assist;Sit to stand from chair;with adaptive equipment ADL Goal: Lower Body Dressing - Progress: Goal set today Pt Will Transfer to Toilet: with min assist;Ambulation;3-in-1 (min guard) ADL Goal: Toilet Transfer - Progress: Goal set today Miscellaneous OT Goals Miscellaneous OT Goal #1: Pt will have adequate endurance to walk into bathroom and complete bathing and dressing OT Goal: Miscellaneous Goal #1 - Progress: Goal set today  Visit Information  Last OT Received On: 05/24/12 Assistance Needed: +1    Subjective Data  Subjective: "This is terrible.  I've had 17 neck and back surgeries...no one told me I wouldn't be able to bend over after I had the rod placed" Patient Stated Goal: rehab then home   Prior Functioning  Vision/Perception  Home Living Lives With: Spouse Available Help at Discharge:  (works second shift) Prior Function Level of Independence: Independent Communication Communication: No difficulties Dominant Hand: Right      Cognition  Overall Cognitive Status: Appears within functional limits for tasks assessed/performed Arousal/Alertness: Awake/alert Orientation Level: Appears intact for tasks assessed Behavior During Session: [facility] for tasks performed    Extremity/Trunk Assessment Right Upper Extremity Assessment RUE ROM/Strength/Tone: [facility] for tasks assessed (h/o shoulder replacement) Left Upper Extremity Assessment LUE ROM/Strength/Tone: WFL for tasks assessed   Mobility Bed Mobility Supine to Sit: 4: Min assist Transfers Sit to Stand: 4: Min assist   Exercise    Balance    End of Session OT - End of Session Activity Tolerance: Patient limited by fatigue Patient left: in chair;with call bell/phone within reach  GO     Carolin Quang 05/24/2012, 12:18 PM Marica Otter, OTR/L [phone] 05/24/2012

## 2012-05-24 NOTE — Progress Notes (Signed)
Physical Therapy Treatment Patient Details Name: Sonya Cordova MRN: [id number] DOB: [birth year] Today's Date: 05/24/2012 Time: 9147-8295 PT Time Calculation (min): 54 min  PT Assessment / Plan / Recommendation Comments on Treatment Session       Follow Up Recommendations  Skilled nursing facility    Barriers to Discharge        Equipment Recommendations  Defer to next venue    Recommendations for Other Services OT consult  Frequency 7X/week   Plan Discharge plan remains appropriate    Precautions / Restrictions Precautions Precautions: Knee Required Braces or Orthoses: Knee Immobilizer - Left Knee Immobilizer - Left: Discontinue once straight leg raise with < 10 degree lag Restrictions Weight Bearing Restrictions: No Other Position/Activity Restrictions: WBAT   Pertinent Vitals/Pain     Mobility  Bed Mobility Bed Mobility: Supine to Sit Supine to Sit: 4: Min assist Details for Bed Mobility Assistance: cues for sequence and use of R LE to self assist Transfers Transfers: Sit to Stand;Stand to Sit Sit to Stand: 4: Min assist Stand to Sit: 4: Min assist;3: Mod assist Details for Transfer Assistance: cues for use of UEs and for LE management Ambulation/Gait Ambulation/Gait Assistance: 4: Min assist;3: Mod assist Ambulation Distance (Feet): 25 Feet (twice) Assistive device: Rolling walker Ambulation/Gait Assistance Details: min cues for stride length, posture and position from RW Gait Pattern: Step-to pattern    Exercises Total Joint Exercises Ankle Circles/Pumps: AROM;Both;20 reps;Supine Quad Sets: AROM;15 reps;Supine;Both Heel Slides: AAROM;15 reps;Left;Supine Straight Leg Raises: AAROM;20 reps;Left;Supine   PT Diagnosis:    PT Problem List:   PT Treatment Interventions:     PT Goals Acute Rehab PT Goals PT Goal Formulation: With patient Time For Goal Achievement: 05/30/12 Potential to Achieve Goals: Good Pt will go Supine/Side to Sit: with  supervision PT Goal: Supine/Side to Sit - Progress: Progressing toward goal Pt will go Sit to Supine/Side: with supervision PT Goal: Sit to Supine/Side - Progress: Progressing toward goal Pt will go Sit to Stand: with supervision PT Goal: Sit to Stand - Progress: Progressing toward goal Pt will go Stand to Sit: with supervision PT Goal: Stand to Sit - Progress: Progressing toward goal Pt will Ambulate: 51 - 150 feet;with supervision;with rolling walker PT Goal: Ambulate - Progress: Progressing toward goal  Visit Information  Last PT Received On: 05/24/12 Assistance Needed: +1 PT/OT Co-Evaluation/Treatment: Yes    Subjective Data  Subjective: I wish this was getting easier, I'm sorry I can't do more Patient Stated Goal: Resume previous lifestyle with decreased pain   Cognition  Overall Cognitive Status: Appears within functional limits for tasks assessed/performed Arousal/Alertness: Awake/alert Orientation Level: Appears intact for tasks assessed Behavior During Session: [facility] for tasks performed    Balance     End of Session PT - End of Session Equipment Utilized During Treatment: Left knee immobilizer Activity Tolerance: Patient limited by fatigue;Patient limited by pain Patient left: with call bell/phone within reach;with nursing in room;in bed Nurse Communication: Mobility status   GP     Lavita Pontius 05/24/2012, 4:00 PM

## 2012-05-24 NOTE — Progress Notes (Signed)
Physical Therapy Treatment Patient Details Name: ELINDA BUNTEN MRN: [id number] DOB: 12-30-1945 Today's Date: 05/24/2012 Time: 6962-9528 PT Time Calculation (min): 34 min  PT Assessment / Plan / Recommendation Comments on Treatment Session       Follow Up Recommendations  Skilled nursing facility    Barriers to Discharge        Equipment Recommendations  Defer to next venue    Recommendations for Other Services OT consult  Frequency 7X/week   Plan Discharge plan remains appropriate    Precautions / Restrictions Precautions Precautions: Knee Required Braces or Orthoses: Knee Immobilizer - Left Knee Immobilizer - Left: Discontinue once straight leg raise with < 10 degree lag Restrictions Weight Bearing Restrictions: No Other Position/Activity Restrictions: WBAT   Pertinent Vitals/Pain     Mobility  Bed Mobility Bed Mobility: Sit to Supine Supine to Sit: 4: Min assist Sit to Supine: 4: Min assist Details for Bed Mobility Assistance: cues for sequence and use of R LE to self assist Transfers Transfers: Sit to Stand;Stand to Sit Sit to Stand: 4: Min assist;3: Mod assist Stand to Sit: 4: Min assist;3: Mod assist Details for Transfer Assistance: cues for use of UEs and for LE management Ambulation/Gait Ambulation/Gait Assistance: 4: Min assist;3: Mod assist Ambulation Distance (Feet): 48 Feet (and 5' x 2) Assistive device: Rolling walker Ambulation/Gait Assistance Details: cues for sequence, increased L heel contact, posture, and position from RW Gait Pattern: Step-to pattern    Exercises Total Joint Exercises Ankle Circles/Pumps: AROM;Both;20 reps;Supine Quad Sets: AROM;15 reps;Supine;Both Heel Slides: AAROM;15 reps;Left;Supine Straight Leg Raises: AAROM;20 reps;Left;Supine   PT Diagnosis:    PT Problem List:   PT Treatment Interventions:     PT Goals Acute Rehab PT Goals PT Goal Formulation: With patient Time For Goal Achievement: 05/30/12 Potential to  Achieve Goals: Good Pt will go Supine/Side to Sit: with supervision PT Goal: Supine/Side to Sit - Progress: Progressing toward goal Pt will go Sit to Supine/Side: with supervision PT Goal: Sit to Supine/Side - Progress: Progressing toward goal Pt will go Sit to Stand: with supervision PT Goal: Sit to Stand - Progress: Progressing toward goal Pt will go Stand to Sit: with supervision PT Goal: Stand to Sit - Progress: Progressing toward goal Pt will Ambulate: 51 - 150 feet;with supervision;with rolling walker PT Goal: Ambulate - Progress: Progressing toward goal  Visit Information  Last PT Received On: 05/24/12 Assistance Needed: +1 PT/OT Co-Evaluation/Treatment: Yes    Subjective Data  Subjective: This is just not getting easier Patient Stated Goal: Resume previous lifestyle with decreased pain   Cognition  Overall Cognitive Status: Appears within functional limits for tasks assessed/performed Arousal/Alertness: Awake/alert Orientation Level: Appears intact for tasks assessed Behavior During Session: [facility] for tasks performed    Balance     End of Session PT - End of Session Equipment Utilized During Treatment: Left knee immobilizer Activity Tolerance: Patient limited by fatigue;Patient limited by pain Patient left: in bed;with call bell/phone within reach;with nursing in room Nurse Communication: Mobility status   GP     Desiree Fleming 05/24/2012, 4:08 PM

## 2012-05-24 NOTE — Care Management Note (Signed)
    Page 1 of 2   05/24/2012     5:14:28 PM   CARE MANAGEMENT NOTE 05/24/2012  Patient:  Sonya Cordova, Sonya Cordova   Account Number:  [id number]  Date Initiated:  05/24/2012  Documentation initiated by:  Colleen Can  Subjective/Objective Assessment:   dx osteoarthritis left knee; total knee replacemnt     Action/Plan:   Cm spoke with patient Plans are for patient to go to SNF rehab   Anticipated DC Date:  05/25/2012   Anticipated DC Plan:  SKILLED NURSING FACILITY  In-house referral  Clinical Social Worker      DC Planning Services  CM consult      [facility] Choice  NA   Choice offered to / List presented to:  NA   DME arranged  NA      DME agency  NA     HH arranged  NA      HH agency  NA   Status of service:  Completed, signed off Medicare Important Message given?  NA - LOS <3 / Initial given by admissions (If response is "NO", the following Medicare IM given date fields will be blank) Date Medicare IM given:   Date Additional Medicare IM given:    Discharge Disposition:    Per UR Regulation:    If discussed at Long Length of Stay Meetings, dates discussed:    Comments:

## 2012-05-24 NOTE — Progress Notes (Signed)
   Subjective: 2 Days Post-Op Procedure(s) (LRB): TOTAL KNEE ARTHROPLASTY (Left) Patient reports pain as moderate.   Patient seen in rounds with Dr. Lequita Halt. Patient is well, and has had no acute complaints or problems. She reports a rough night last night. She had an increase in pain after walking so much with therapy yesterday, 77 feet. She is also concerned about getting levothyroxine instead of the name brand Synthroid. She reports that she does not like the generic form. No complaints of chest pain or shortness of breath. Voiding well. Positive flatus.  Plan is to go Skilled nursing facility hopefully [facility] after hospital stay.  Objective: Vital signs in last 24 hours: Temp:  [97.7 F (36.5 C)-100.7 F (38.2 C)] 100.3 F (37.9 C) (08/21 4540) Pulse Rate:  [67-78] 78  (08/21 0614) Resp:  [12-16] 14  (08/21 0614) BP: (105-149)/(56-80) 135/70 mmHg (08/21 0614) SpO2:  [95 %-98 %] 95 % (08/21 0614)  Intake/Output from previous day:  Intake/Output Summary (Last 24 hours) at 05/24/12 1155 Last data filed at 05/24/12 0949  Gross per 24 hour  Intake 1467.5 ml  Output   2700 ml  Net -1232.5 ml    Intake/Output this shift: Total I/O In: 0  Out: 300 [Urine:300]  Labs:  [facility] 05/24/12 0350 05/23/12 2034 05/23/12 0420  HGB 11.2* 11.1* 8.4*    Basename 05/24/12 0350 05/23/12 2034 05/23/12 0420  WBC 7.4 -- 6.9  RBC 3.56* -- 2.68*  HCT 32.6* 32.9* --  PLT 123* -- 126*    Basename 05/24/12 0350 05/23/12 0420  NA 136 137  K 3.5 3.8  CL 100 102  CO2 32 30  BUN 12 12  CREATININE 0.64 0.66  GLUCOSE 108* 133*  CALCIUM 8.3* 8.2*   EXAM General - Patient is Alert and Oriented Extremity - Neurologically intact Intact pulses distally Dorsiflexion/Plantar flexion intact Incision: no drainage Dressing/Incision - clean, dry Motor Function - intact, moving foot and toes well on exam.   Past Medical History  Diagnosis Date  . Degenerative disc disease 1995  .  Arthritis   . Connective tissue disease   . Osteopenia 02/2012    t score -1.1  . Family history of anesthesia complication   . Complication of anesthesia     "states remembers surgeon talking- was light"  . Hypothyroidism   . Depression   . Edema of both legs   . Headache     migraines  . History of blood transfusion   . Anemia     Assessment/Plan: 2 Days Post-Op Procedure(s) (LRB): TOTAL KNEE ARTHROPLASTY (Left) Principal Problem:  *OA (osteoarthritis) of knee Active Problems:  Postoperative anemia due to acute blood loss   Advance diet Up with therapy D/C IV fluids Plan for discharge tomorrow Discharge to SNF tomorrow DVT Prophylaxis - Xarelto Weight-Bearing as tolerated to left leg She is progressing well. We are unable to order the Synthroid as it is not on hospital formulary. She can take her medicine from home. We will continue to therapy today and get her to SNF tomorrow. She will talk with social work today and get placement finalized. Hgb much improved since transfusion yesterday.   Sonya Cordova 05/24/2012, 11:55 AM

## 2012-05-25 DIAGNOSIS — Z9289 Personal history of other medical treatment: Secondary | ICD-10-CM

## 2012-05-25 LAB — CBC
HCT: 30.3 % — ABNORMAL LOW (ref 36.0–46.0)
Hemoglobin: 10.3 g/dL — ABNORMAL LOW (ref 12.0–15.0)
MCH: 31.3 pg (ref 26.0–34.0)
MCHC: 34 g/dL (ref 30.0–36.0)
MCV: 92.1 fL (ref 78.0–100.0)
Platelets: 117 10*3/uL — ABNORMAL LOW (ref 150–400)
RBC: 3.29 MIL/uL — ABNORMAL LOW (ref 3.87–5.11)
RDW: 13.3 % (ref 11.5–15.5)
WBC: 6.7 10*3/uL (ref 4.0–10.5)

## 2012-05-25 MED ORDER — DSS 100 MG PO CAPS: 100.0000 mg | ORAL_CAPSULE | Freq: Two times a day (BID) | ORAL | Status: AC

## 2012-05-25 MED ORDER — ACETAMINOPHEN 325 MG PO TABS: 650.0000 mg | ORAL_TABLET | Freq: Four times a day (QID) | ORAL | Status: DC | PRN

## 2012-05-25 MED ORDER — ONDANSETRON HCL 4 MG PO TABS: 4.0000 mg | ORAL_TABLET | Freq: Four times a day (QID) | ORAL | Status: AC | PRN

## 2012-05-25 MED ORDER — BISACODYL 10 MG RE SUPP: 10.0000 mg | Freq: Every day | RECTAL | Status: AC | PRN

## 2012-05-25 MED ORDER — OXYCODONE HCL 5 MG PO TABS: 5.0000 mg | ORAL_TABLET | ORAL | Status: AC | PRN

## 2012-05-25 MED ORDER — METHOCARBAMOL 500 MG PO TABS: 500.0000 mg | ORAL_TABLET | Freq: Four times a day (QID) | ORAL | Status: AC | PRN

## 2012-05-25 MED ORDER — RIVAROXABAN 10 MG PO TABS: 10.0000 mg | ORAL_TABLET | Freq: Every day | ORAL | Status: DC

## 2012-05-25 NOTE — Progress Notes (Signed)
   Subjective: 3 Days Post-Op Procedure(s) (LRB): TOTAL KNEE ARTHROPLASTY (Left) Patient reports pain as mild.   Patient seen in rounds by Dr. Lequita Halt. Patient is well, and has had no acute complaints or problems Patient is ready to go [employer] today.  Objective: Vital signs in last 24 hours: Temp:  [98.6 F (37 C)-99.3 F (37.4 C)] 98.6 F (37 C) (08/22 0635) Pulse Rate:  [77-85] 85  (08/22 0635) Resp:  [14-16] 15  (08/22 0735) BP: (124-127)/(66-70) 127/70 mmHg (08/22 0635) SpO2:  [94 %-96 %] 94 % (08/22 0635)  Intake/Output from previous day:  Intake/Output Summary (Last 24 hours) at 05/25/12 0806 Last data filed at 05/24/12 1853  Gross per 24 hour  Intake    240 ml  Output    600 ml  Net   -360 ml    Intake/Output this shift:    Labs:  Basename 05/25/12 0355 05/24/12 0350 05/23/12 2034 05/23/12 0420  HGB 10.3* 11.2* 11.1* 8.4*    Basename 05/25/12 0355 05/24/12 0350  WBC 6.7 7.4  RBC 3.29* 3.56*  HCT 30.3* 32.6*  PLT 117* 123*    Basename 05/24/12 0350 05/23/12 0420  NA 136 137  K 3.5 3.8  CL 100 102  CO2 32 30  BUN 12 12  CREATININE 0.64 0.66  GLUCOSE 108* 133*  CALCIUM 8.3* 8.2*   No results found for this basename: LABPT:2,INR:2 in the last 72 hours  EXAM: General - Patient is Alert, Appropriate and Oriented Extremity - Neurovascular intact Sensation intact distally Incision - clean, dry, no drainage, healing Motor Function - intact, moving foot and toes well on exam.   Assessment/Plan: 3 Days Post-Op Procedure(s) (LRB): TOTAL KNEE ARTHROPLASTY (Left) Procedure(s) (LRB): TOTAL KNEE ARTHROPLASTY (Left) Past Medical History  Diagnosis Date  . Degenerative disc disease 1995  . Arthritis   . Connective tissue disease   . Osteopenia 02/2012    t score -1.1  . Family history of anesthesia complication   . Complication of anesthesia     "states remembers surgeon talking- was light"  . Hypothyroidism   . Depression   . Edema of both  legs   . Headache     migraines  . History of blood transfusion   . Anemia    Principal Problem:  *OA (osteoarthritis) of knee Active Problems:  Postoperative anemia due to acute blood loss  Postop Transfusion   Up with therapy Discharge to SNF Diet - Regular diet Follow up - in 2 weeks Activity - WBAT Disposition - Skilled nursing facility Condition Upon Discharge - Good D/C Meds - See DC Summary DVT Prophylaxis - Xarelto  Shawonda Kerce 05/25/2012, 8:06 AM

## 2012-05-25 NOTE — Progress Notes (Signed)
Clinical Social Work Department CLINICAL SOCIAL WORK PLACEMENT NOTE 05/25/2012  Patient:  Sonya Cordova, Sonya Cordova  Account Number:  [id number] Admit date:  05/22/2012  Clinical Social Worker:  ,  Date/time:  05/23/2012 03:40 PM  Clinical Social Work is seeking post-discharge placement for this patient at the following level of care:   SKILLED NURSING   (*CSW will update this form in Epic as items are completed)   05/23/2012  Patient/family provided with [facility] Department of Clinical Social Work's list of facilities offering this level of care within the geographic area requested by the patient (or if unable, by the patient's family).  05/23/2012  Patient/family informed of their freedom to choose among providers that offer the needed level of care, that participate in Medicare, Medicaid or managed care program needed by the patient, have an available bed and are willing to accept the patient.    Patient/family informed of [facility]' ownership interest in [facility], as well as of the fact that they are under no obligation to receive care at this facility.  PASARR submitted to EDS on 05/23/2012 PASARR number received from EDS on   FL2 transmitted to all facilities in geographic area requested by pt/family on  05/23/2012 FL2 transmitted to all facilities within larger geographic area on   Patient informed that his/her managed care company has contracts with or will negotiate with  certain facilities, including the following:     Patient/family informed of bed offers received:  05/23/2012 Patient chooses bed at [facility] Physician recommends and patient chooses bed at    Patient to be transferred to [facility] on  05/25/2012 Patient to be transferred to facility by [facility]  The following physician request were entered in Epic:   Additional Comments: [employer] provided prior approval for SNF placement and ambulance transport.  Cori Razor LCSW [phone]

## 2012-05-25 NOTE — Anesthesia Postprocedure Evaluation (Signed)
Anesthesia Post Note  Patient: Sonya Cordova  Procedure(s) Performed: Procedure(s) (LRB): TOTAL KNEE ARTHROPLASTY (Left)  Anesthesia type: General  Patient location: PACU  Post pain: Pain level controlled  Post assessment: Post-op Vital signs reviewed  Last Vitals:  Filed Vitals:   05/25/12 1134  BP:   Pulse:   Temp:   Resp: 16    Post vital signs: Reviewed  Level of consciousness: sedated  Complications: No apparent anesthesia complications

## 2012-05-25 NOTE — Discharge Summary (Signed)
Physician Discharge Summary   Patient ID: [name] MRN: [id number] DOB/AGE: [birth year] 66 y.o.  Admit date: 05/22/2012 Discharge date: 05/25/2012  Primary Diagnosis: Osteoarthritis Left knee   Admission Diagnoses:  Past Medical History  Diagnosis Date  . Degenerative disc disease 1995  . Arthritis   . Connective tissue disease   . Osteopenia 02/2012    t score -1.1  . Family history of anesthesia complication   . Complication of anesthesia     "states remembers surgeon talking- was light"  . Hypothyroidism   . Depression   . Edema of both legs   . Headache     migraines  . History of blood transfusion   . Anemia    Discharge Diagnoses:   Principal Problem:  *OA (osteoarthritis) of knee Active Problems:  Postoperative anemia due to acute blood loss  Postop Transfusion  Procedure:  Procedure(s) (LRB): TOTAL KNEE ARTHROPLASTY (Left)   Consults: None  HPI: Sonya Cordova is a 66 y.o. year old female with end stage OA of her left knee with progressively worsening pain and dysfunction. She has constant pain, with activity and at rest and significant functional deficits with difficulties even with ADLs. She has had extensive non-op management including analgesics, injections of cortisone and viscosupplements, and home exercise program, but remains in significant pain with significant dysfunction. Radiographs show bone on bone arthritis all 3 compartments. She presents now for left Total Knee Arthroplasty.   Laboratory Data: Hospital Outpatient Visit on 05/16/2012  Component Date Value Range Status  . WBC 05/16/2012 5.2  4.0 - 10.5 K/uL Final  . RBC 05/16/2012 4.10  3.87 - 5.11 MIL/uL Final  . Hemoglobin 05/16/2012 13.1  12.0 - 15.0 g/dL Final  . HCT 40/98/1191 38.4  36.0 - 46.0 % Final  . MCV 05/16/2012 93.7  78.0 - 100.0 fL Final  . MCH 05/16/2012 32.0  26.0 - 34.0 pg Final  . MCHC 05/16/2012 34.1  30.0 - 36.0 g/dL Final  . RDW 47/82/9562 12.9  11.5 - 15.5 %  Final  . Platelets 05/16/2012 158  150 - 400 K/uL Final  . Sodium 05/16/2012 136  135 - 145 mEq/L Final  . Potassium 05/16/2012 4.2  3.5 - 5.1 mEq/L Final  . Chloride 05/16/2012 99  96 - 112 mEq/L Final  . CO2 05/16/2012 33* 19 - 32 mEq/L Final  . Glucose, Bld 05/16/2012 98  70 - 99 mg/dL Final  . BUN 13/05/6577 30* 6 - 23 mg/dL Final  . Creatinine, Ser 05/16/2012 0.85  0.50 - 1.10 mg/dL Final  . Calcium 46/96/2952 9.3  8.4 - 10.5 mg/dL Final  . Total Protein 05/16/2012 6.7  6.0 - 8.3 g/dL Final  . Albumin 84/13/2440 3.9  3.5 - 5.2 g/dL Final  . AST 07/31/2535 25  0 - 37 U/L Final  . ALT 05/16/2012 16  0 - 35 U/L Final  . Alkaline Phosphatase 05/16/2012 53  39 - 117 U/L Final  . Total Bilirubin 05/16/2012 0.5  0.3 - 1.2 mg/dL Final  . GFR calc non Af Amer 05/16/2012 71* >90 mL/min Final  . GFR calc Af Amer 05/16/2012 82* >90 mL/min Final   Comment:                                 The eGFR has been calculated  using the CKD EPI equation.                          This calculation has not been                          validated in all clinical                          situations.                          eGFR's persistently                          <90 mL/min signify                          possible Chronic Kidney Disease.  Marland Kitchen aPTT 05/16/2012 32  24 - 37 seconds Final  . Prothrombin Time 05/16/2012 11.8  11.6 - 15.2 seconds Final  . INR 05/16/2012 0.85  0.00 - 1.49 Final  . MRSA, PCR 05/16/2012 NEGATIVE  NEGATIVE Final  . Staphylococcus aureus 05/16/2012 NEGATIVE  NEGATIVE Final   Comment:                                 The Xpert SA Assay (FDA                          approved for NASAL specimens                          in patients over 19 years of age),                          is one component of                          a comprehensive surveillance                          program.  Test performance has                          been validated by  [employer] for patients greater                          than or equal to 83 year old.                          It is not intended                          to diagnose infection nor to                          guide or monitor treatment.  . Color, Urine 05/16/2012 AMBER* YELLOW Final  BIOCHEMICALS MAY BE AFFECTED BY COLOR  . APPearance 05/16/2012 CLEAR  CLEAR Final  . Specific Gravity, Urine 05/16/2012 1.031* 1.005 - 1.030 Final  . pH 05/16/2012 5.5  5.0 - 8.0 Final  . Glucose, UA 05/16/2012 NEGATIVE  NEGATIVE mg/dL Final  . Hgb urine dipstick 05/16/2012 NEGATIVE  NEGATIVE Final  . Bilirubin Urine 05/16/2012 SMALL* NEGATIVE Final  . Ketones, ur 05/16/2012 NEGATIVE  NEGATIVE mg/dL Final  . Protein, ur 16/07/9603 NEGATIVE  NEGATIVE mg/dL Final  . Urobilinogen, UA 05/16/2012 1.0  0.0 - 1.0 mg/dL Final  . Nitrite 54/06/8118 NEGATIVE  NEGATIVE Final  . Leukocytes, UA 05/16/2012 NEGATIVE  NEGATIVE Final   MICROSCOPIC NOT DONE ON URINES WITH NEGATIVE PROTEIN, BLOOD, LEUKOCYTES, NITRITE, OR GLUCOSE <1000 mg/dL.  . ABO/RH(D) 05/16/2012 B POS   Final  . Antibody Screen 05/16/2012 NEG   Final  . Sample Expiration 05/16/2012 05/25/2012   Final  . Unit Number 05/16/2012 [id number]   Final  . Blood Component Type 05/16/2012 RED CELLS,LR   Final  . Unit division 05/16/2012 00   Final  . Status of Unit 05/16/2012 ISSUED,FINAL   Final  . Transfusion Status 05/16/2012 OK TO TRANSFUSE   Final  . Crossmatch Result 05/16/2012 Compatible   Final  . Unit Number 05/16/2012 [id number]   Final  . Blood Component Type 05/16/2012 RED CELLS,LR   Final  . Unit division 05/16/2012 00   Final  . Status of Unit 05/16/2012 ISSUED,FINAL   Final  . Transfusion Status 05/16/2012 OK TO TRANSFUSE   Final  . Crossmatch Result 05/16/2012 Compatible   Final  . ABO/RH(D) 05/16/2012 B POS   Final    Basename 05/25/12 0355 05/24/12 0350 05/23/12 2034 05/23/12 0420  HGB 10.3* 11.2* 11.1*  8.4*    Basename 05/25/12 0355 05/24/12 0350  WBC 6.7 7.4  RBC 3.29* 3.56*  HCT 30.3* 32.6*  PLT 117* 123*    Basename 05/24/12 0350 05/23/12 0420  NA 136 137  K 3.5 3.8  CL 100 102  CO2 32 30  BUN 12 12  CREATININE 0.64 0.66  GLUCOSE 108* 133*  CALCIUM 8.3* 8.2*   No results found for this basename: LABPT:2,INR:2 in the last 72 hours  X-Rays:Mm Digital Screening W/ Implants  05/04/2012  *RADIOLOGY REPORT*  Clinical Data: Screening.  MAMMOGRAPHIC DIGITAL SCREENING IMPLANTS WITH CAD  The patient has  implants. Standard and implant displaced views were performed.  Comparison:  Previous exams  Findings: The breast tissue is extremely dense. No suspicious masses, architectural distortion, or calcifications are present.  Images were processed with CAD.  IMPRESSION: No mammographic evidence of malignancy.  A result letter of this screening mammogram will be mailed directly to the patient.  RECOMMENDATION: Screening mammogram in one year. (Code:SM-B-01Y)  BI-RADS CATEGORY 1:  Negative  Original Report Authenticated By: [email or web address]    EKG:No orders found for this or any previous visit.   Hospital Course: Patient was admitted to [facility] and taken to the OR and underwent the above state procedure without complications.  Patient tolerated the procedure well and was later transferred to the recovery room and then to the orthopaedic floor for postoperative care.  They were given PO and IV analgesics for pain control following their surgery.  They were given 24 hours of postoperative antibiotics and started on DVT prophylaxis in the form of Xarelto.   PT and OT were ordered for total joint protocol.  Discharge planning consulted to help with postop disposition and equipment needs.  Patient had a decent night on the evening of surgery and started to get up OOB with therapy on day one.  PCA Morphine was discontinued and they were weaned over to PO meds.  Hemovac drain was pulled without difficulty.   Hgb was 8.4 and BP 100/59. Transfused 2 units with Lasix to follow. Performed therapy after transfusion. Continued CPM.  Continued to work with therapy into day two despite increased pain from therapy.  Dressing was changed on day two and the incision was healing well.  We are unable to order the Synthroid as it is not on hospital formulary. She was allowed to take her medicine from home. By day three, the patient had progressed with therapy and meeting their goals.  Incision was healing well.  Patient was seen in rounds and was ready to go to [facility].  Discharge Medications: Prior to Admission medications   Medication Sig Start Date End Date Taking? Authorizing Provider  Eszopiclone (ESZOPICLONE) 3 MG TABS Take 3 mg by mouth at bedtime. BRAND NAME ONLY- Take immediately before bedtime   Yes Historical Provider, MD  gabapentin (NEURONTIN) 100 MG capsule Take 300 mg by mouth 3 (three) times daily.   Yes Historical Provider, MD  levothyroxine (SYNTHROID, LEVOTHROID) 125 MCG tablet Take 125 mcg by mouth daily.   Yes Historical Provider, MD  morphine (MSIR) 30 MG tablet Take 30 mg by mouth every 4 (four) hours as needed. pain   Yes Historical Provider, MD  potassium chloride (K-DUR,KLOR-CON) 10 MEQ tablet Take 10 mEq by mouth 2 (two) times daily. With torsemide usage   Yes Historical Provider, MD  torsemide (DEMADEX) 10 MG tablet Take 5 mg by mouth daily. 5-10 mg  Prn  po   Yes Historical Provider, MD  acetaminophen (TYLENOL) 325 MG tablet Take 2 tablets (650 mg total) by mouth every 6 (six) hours as needed (or Fever >/= 101). 05/25/12 05/25/13  Alexzandrew Perkins, PA  bisacodyl (DULCOLAX) 10 MG suppository Place 1 suppository (10 mg total) rectally daily as needed. 05/25/12 06/04/12  Alexzandrew Perkins, PA  docusate sodium 100 MG CAPS Take 100 mg by mouth 2 (two) times daily. 05/25/12 06/04/12  Alexzandrew Perkins, PA  methocarbamol (ROBAXIN) 500 MG tablet Take 1 tablet (500 mg total) by mouth every 6  (six) hours as needed. 05/25/12 06/04/12  Alexzandrew Perkins, PA  ondansetron (ZOFRAN) 4 MG tablet Take 1 tablet (4 mg total) by mouth every 6 (six) hours as needed for nausea. 05/25/12 06/01/12  Alexzandrew Perkins, PA  oxyCODONE (OXY IR/ROXICODONE) 5 MG immediate release tablet Take 1-4 tablets (5-20 mg total) by mouth every 4 (four) hours as needed for pain. 05/25/12 06/04/12  Alexzandrew Perkins, PA  polyethylene glycol (MIRALAX / GLYCOLAX) packet Take 17 g by mouth as needed. constipation    Historical Provider, MD  rivaroxaban (XARELTO) 10 MG TABS tablet Take 1 tablet (10 mg total) by mouth daily with breakfast. Take Xarelto for two and a half more weeks, then discontinue Xarelto. 05/25/12   Alexzandrew Julien Girt, PA    Diet: Regular diet Activity:WBAT Follow-up:in 2 weeks Disposition - Skilled nursing facility - [facility] Discharged Condition: good   Discharge Orders    Future Orders Please Complete By Expires   Diet - low sodium heart healthy      Call MD / Call 911      Comments:   If you experience chest pain or shortness of breath, CALL 911 and be transported to the hospital emergency room.  If you  develope a fever above 101 F, pus (white drainage) or increased drainage or redness at the wound, or calf pain, call your surgeon's office.   Discharge instructions      Comments:   Pick up stool softner and laxative for home. Do not submerge incision under water. May shower. Continue to use ice for pain and swelling from surgery.  Take Xarelto for two and a half more weeks, then discontinue Xarelto.  When discharged from the skilled rehab facility, please have the facility set up the patient's Home Health Physical Therapy prior to being released.  Also provide the patient with their medications at time of release from the facility to include their pain medication, the muscle relaxants, and their blood thinner medication.  If the patient is still at the rehab facility at time of follow up  appointment, please also assist the patient in arranging follow up appointment in our office and any transportation needs.   Constipation Prevention      Comments:   Drink plenty of fluids.  Prune juice may be helpful.  You may use a stool softener, such as Colace (over the counter) 100 mg twice a day.  Use MiraLax (over the counter) for constipation as needed.   Increase activity slowly as tolerated      Patient may shower      Comments:   You may shower without a dressing once there is no drainage.  Do not wash over the wound.  If drainage remains, do not shower until drainage stops.   Driving restrictions      Comments:   No driving until released by the physician.   Lifting restrictions      Comments:   No lifting until released by the physician.   CPM      Comments:   Continuous passive motion machine (CPM):      Use the CPM from 0 to 40 degrees for 6-8 hours per day.      You may increase by 5-10 degrees per day.  Please break it up into 2 or 3 sessions per day.      Use CPM for 2 more weeks or until you are told to stop. May use CPM while at the skilled facility but will NOT need at home.   TED hose      Comments:   Use stockings (TED hose) for 3 weeks on both leg(s).  You may remove them at night for sleeping.   Change dressing      Comments:   Change dressing daily with sterile 4 x 4 inch gauze dressing and apply TED hose. Do not submerge the incision under water.   Do not put a pillow under the knee. Place it under the heel.      Do not sit on low chairs, stoools or toilet seats, as it may be difficult to get up from low surfaces        Medication List  As of 05/25/2012  9:39 AM   STOP taking these medications         ARTHROTEC 75-0.2 MG Tbec      estradiol 1 MG tablet      multivitamin tablet      SM VITAMIN B-12 PO      Vitamin D 2000 UNITS Caps         TAKE these medications         acetaminophen 325 MG tablet   Commonly known as: TYLENOL   Take 2 tablets  (  650 mg total) by mouth every 6 (six) hours as needed (or Fever >/= 101).      bisacodyl 10 MG suppository   Commonly known as: DULCOLAX   Place 1 suppository (10 mg total) rectally daily as needed.      DSS 100 MG Caps   Take 100 mg by mouth 2 (two) times daily.      eszopiclone 3 MG Tabs   Generic drug: Eszopiclone   Take 3 mg by mouth at bedtime. BRAND NAME ONLY- Take immediately before bedtime      gabapentin 100 MG capsule   Commonly known as: NEURONTIN   Take 300 mg by mouth 3 (three) times daily.      levothyroxine 125 MCG tablet   Commonly known as: SYNTHROID, LEVOTHROID   Take 125 mcg by mouth daily.      methocarbamol 500 MG tablet   Commonly known as: ROBAXIN   Take 1 tablet (500 mg total) by mouth every 6 (six) hours as needed.      morphine 30 MG tablet   Commonly known as: MSIR   Take 30 mg by mouth every 4 (four) hours as needed. pain      ondansetron 4 MG tablet   Commonly known as: ZOFRAN   Take 1 tablet (4 mg total) by mouth every 6 (six) hours as needed for nausea.      oxyCODONE 5 MG immediate release tablet   Commonly known as: Oxy IR/ROXICODONE   Take 1-4 tablets (5-20 mg total) by mouth every 4 (four) hours as needed for pain.      polyethylene glycol packet   Commonly known as: MIRALAX / GLYCOLAX   Take 17 g by mouth as needed. constipation      potassium chloride 10 MEQ tablet   Commonly known as: K-DUR,KLOR-CON   Take 10 mEq by mouth 2 (two) times daily. With torsemide usage      rivaroxaban 10 MG Tabs tablet   Commonly known as: XARELTO   Take 1 tablet (10 mg total) by mouth daily with breakfast. Take Xarelto for two and a half more weeks, then discontinue Xarelto.      torsemide 10 MG tablet   Commonly known as: DEMADEX   Take 5 mg by mouth daily. 5-10 mg  Prn  po           Follow-up Information    Follow up with Loanne Drilling, MD. Schedule an appointment as soon as possible for a visit in 2 weeks.   Contact information:    [facility] [address] [place] [address] [phone]          Signed: Patrica Duel 05/25/2012, 9:39 AM

## 2012-05-25 NOTE — Progress Notes (Signed)
Discharge summary sent to payer through [employer]  

## 2012-05-25 NOTE — Progress Notes (Signed)
Pt d/c to [facility]. Gave report to [place]. Pt premedicated for pain upon d/c. Home medications returned to pt.

## 2012-05-25 NOTE — Progress Notes (Signed)
Physical Therapy Treatment Patient Details Name: Sonya Cordova MRN: [id number] DOB: 1946-09-22 Today's Date: 05/25/2012 Time: 7829-5621 PT Time Calculation (min): 35 min  PT Assessment / Plan / Recommendation Comments on Treatment Session  Marked improvement in knee flex and quality of ambulation vs yesterday    Follow Up Recommendations  Skilled nursing facility    Barriers to Discharge        Equipment Recommendations  Defer to next venue    Recommendations for Other Services OT consult  Frequency 7X/week   Plan Discharge plan remains appropriate    Precautions / Restrictions Precautions Precautions: Knee Required Braces or Orthoses: Knee Immobilizer - Left Knee Immobilizer - Left: Discontinue once straight leg raise with < 10 degree lag Restrictions Weight Bearing Restrictions: No Other Position/Activity Restrictions: WBAT   Pertinent Vitals/Pain     Mobility  Bed Mobility Bed Mobility: Supine to Sit Supine to Sit: 4: Min assist Details for Bed Mobility Assistance: cues for sequence and use of R LE to self assist Transfers Transfers: Sit to Stand;Stand to Sit Sit to Stand: 4: Min assist Stand to Sit: 4: Min assist Details for Transfer Assistance: cues for use of UEs and for LE management Ambulation/Gait Ambulation/Gait Assistance: 4: Min assist;3: Mod assist Ambulation Distance (Feet): 49 Feet Assistive device: Rolling walker Ambulation/Gait Assistance Details: cues for posture and position from RW Gait Pattern: Step-to pattern    Exercises Total Joint Exercises Ankle Circles/Pumps: AROM;Both;20 reps;Supine Quad Sets: AROM;15 reps;Supine;Both Heel Slides: AAROM;20 reps;Left;Supine Straight Leg Raises: AAROM;20 reps;Left;Supine   PT Diagnosis:    PT Problem List:   PT Treatment Interventions:     PT Goals Acute Rehab PT Goals PT Goal Formulation: With patient Time For Goal Achievement: 05/30/12 Potential to Achieve Goals: Good Pt will go  Supine/Side to Sit: with supervision PT Goal: Supine/Side to Sit - Progress: Progressing toward goal Pt will go Sit to Supine/Side: with supervision PT Goal: Sit to Supine/Side - Progress: Progressing toward goal Pt will go Sit to Stand: with supervision PT Goal: Sit to Stand - Progress: Progressing toward goal Pt will go Stand to Sit: with supervision PT Goal: Stand to Sit - Progress: Progressing toward goal Pt will Ambulate: 51 - 150 feet;with supervision;with rolling walker PT Goal: Ambulate - Progress: Progressing toward goal  Visit Information  Last PT Received On: 05/25/12 Assistance Needed: +1    Subjective Data  Subjective: I'm feeling better than yesterday Patient Stated Goal: Resume previous lifestyle with decreased pain   Cognition  Overall Cognitive Status: Appears within functional limits for tasks assessed/performed Arousal/Alertness: Awake/alert Orientation Level: Appears intact for tasks assessed Behavior During Session: [facility] for tasks performed    Balance     End of Session PT - End of Session Equipment Utilized During Treatment: Left knee immobilizer Activity Tolerance: Patient tolerated treatment well Patient left: in chair;with call bell/phone within reach Nurse Communication: Mobility status   GP     Tito Ausmus 05/25/2012, 12:20 PM

## 2012-07-04 ENCOUNTER — Ambulatory Visit: Payer: Medicare Other | Attending: Orthopedic Surgery | Admitting: Rehabilitative and Restorative Service Providers"

## 2012-07-04 DIAGNOSIS — Z96659 Presence of unspecified artificial knee joint: Secondary | ICD-10-CM | POA: Insufficient documentation

## 2012-07-04 DIAGNOSIS — M6281 Muscle weakness (generalized): Secondary | ICD-10-CM | POA: Insufficient documentation

## 2012-07-04 DIAGNOSIS — IMO0001 Reserved for inherently not codable concepts without codable children: Secondary | ICD-10-CM | POA: Insufficient documentation

## 2012-07-04 DIAGNOSIS — M25669 Stiffness of unspecified knee, not elsewhere classified: Secondary | ICD-10-CM | POA: Insufficient documentation

## 2012-07-07 ENCOUNTER — Ambulatory Visit: Payer: Medicare Other | Admitting: Physical Therapy

## 2012-07-11 ENCOUNTER — Ambulatory Visit: Payer: Medicare Other | Admitting: Physical Therapy

## 2012-07-14 ENCOUNTER — Ambulatory Visit: Payer: Medicare Other | Admitting: Physical Therapy

## 2012-07-18 ENCOUNTER — Ambulatory Visit: Payer: Medicare Other | Admitting: Rehabilitative and Restorative Service Providers"

## 2012-07-21 ENCOUNTER — Ambulatory Visit: Payer: Medicare Other | Admitting: Physical Therapy

## 2012-07-24 ENCOUNTER — Encounter: Payer: Medicare Other | Admitting: Rehabilitative and Restorative Service Providers"

## 2012-08-08 ENCOUNTER — Ambulatory Visit: Payer: Medicare Other | Attending: Orthopedic Surgery | Admitting: Physical Therapy

## 2012-08-08 DIAGNOSIS — M6281 Muscle weakness (generalized): Secondary | ICD-10-CM | POA: Insufficient documentation

## 2012-08-08 DIAGNOSIS — Z96659 Presence of unspecified artificial knee joint: Secondary | ICD-10-CM | POA: Insufficient documentation

## 2012-08-08 DIAGNOSIS — IMO0001 Reserved for inherently not codable concepts without codable children: Secondary | ICD-10-CM | POA: Insufficient documentation

## 2012-08-08 DIAGNOSIS — M25669 Stiffness of unspecified knee, not elsewhere classified: Secondary | ICD-10-CM | POA: Insufficient documentation

## 2012-08-10 ENCOUNTER — Ambulatory Visit: Payer: Medicare Other | Admitting: Physical Therapy

## 2012-08-11 ENCOUNTER — Encounter: Payer: Medicare Other | Admitting: Physical Therapy

## 2012-08-15 ENCOUNTER — Ambulatory Visit: Payer: Medicare Other | Admitting: Physical Therapy

## 2012-08-18 ENCOUNTER — Ambulatory Visit: Payer: Medicare Other | Admitting: Physical Therapy

## 2012-08-22 ENCOUNTER — Ambulatory Visit: Payer: Medicare Other | Admitting: Physical Therapy

## 2012-08-25 ENCOUNTER — Encounter: Payer: Medicare Other | Admitting: Rehabilitative and Restorative Service Providers"

## 2012-08-28 ENCOUNTER — Ambulatory Visit: Payer: Medicare Other | Admitting: Physical Therapy

## 2012-08-30 ENCOUNTER — Encounter: Payer: Medicare Other | Admitting: Physical Therapy

## 2012-09-05 ENCOUNTER — Encounter: Payer: Medicare Other | Admitting: Rehabilitative and Restorative Service Providers"

## 2012-09-08 ENCOUNTER — Encounter: Payer: Medicare Other | Admitting: Physical Therapy

## 2012-09-11 ENCOUNTER — Ambulatory Visit (INDEPENDENT_AMBULATORY_CARE_PROVIDER_SITE_OTHER): Payer: Medicare Other | Admitting: Sports Medicine

## 2012-09-11 VITALS — BP 99/66 | HR 75 | Ht 66.75 in | Wt 137.0 lb

## 2012-09-11 DIAGNOSIS — M948X9 Other specified disorders of cartilage, unspecified sites: Secondary | ICD-10-CM

## 2012-09-11 DIAGNOSIS — M2580 Other specified joint disorders, unspecified joint: Secondary | ICD-10-CM

## 2012-09-11 NOTE — Progress Notes (Signed)
  Subjective:    Patient ID: Sonya Cordova, female    DOB: 04-16-1946, 66 y.o.   MRN: [id number]  HPI  Patient presents as a new patient for complaint of left foot pain. Patient was referred by her PM&R pain clinic physician for possible sesamoiditis. She had total knee replacement in August 2013, and one week post-op had intense pain in her left great toe that radiated to the pad. (Work up for gout and also with lower extremity doppler were negative.) She states that the pain feels like a burning pain. She experiences increased pain with walking/standing but the pain does not fully go away with rest. It hurts in all shoes and worse when barefoot. She also reports pain when wiggling her toes. She has never had any problems with her feet and has never worn insoles before  PMH/PSH: Patient has arthritis in multiple joints. She has had a total of 17 spine surgeries and has a spine stimulator. She also reports peripheral neuropathy and lower extremity edema.  Review of Systems Negative except as mentioned in HPI above    Objective:   Physical Exam  Gen: Awake, alert, NAD. Lower extremities: Trace edema bilaterally, neurovascularly intact. Left foot: Good ROM of ankle. No redness or swelling of joints. No tenderness of metatarsals or with metatarsal squeeze. TTP of sesamoids. Pain with movement of great toe.  Standing: No splaying of toes. High arches appreciated. Gait is slow (secondary to back limitations) but equal stride without limp.      Assessment & Plan:   66 yo F new patient with left foot pain due to sesamoiditis  - Given both dancers pad and metatarsal pad on green sole inserts. She should use whichever feels more comfortable for her to relieve the pressure off of her great toe. - She should wear comfortable shoes and avoid going barefoot - Continue Arthrotec (which she has been on for arthritis) - Return to clinic prn for follow up if pain worsens, inserts are uncomfortable or  she fails to improve with time  Hoda Hon M. Kahne Helfand, M.D.

## 2012-10-06 DIAGNOSIS — G894 Chronic pain syndrome: Secondary | ICD-10-CM | POA: Diagnosis not present

## 2012-10-06 DIAGNOSIS — M47812 Spondylosis without myelopathy or radiculopathy, cervical region: Secondary | ICD-10-CM | POA: Diagnosis not present

## 2012-10-06 DIAGNOSIS — M7750 Other enthesopathy of unspecified foot: Secondary | ICD-10-CM | POA: Diagnosis not present

## 2012-10-06 DIAGNOSIS — M47817 Spondylosis without myelopathy or radiculopathy, lumbosacral region: Secondary | ICD-10-CM | POA: Diagnosis not present

## 2012-10-30 DIAGNOSIS — M47817 Spondylosis without myelopathy or radiculopathy, lumbosacral region: Secondary | ICD-10-CM | POA: Diagnosis not present

## 2012-10-30 DIAGNOSIS — G894 Chronic pain syndrome: Secondary | ICD-10-CM | POA: Diagnosis not present

## 2012-10-30 DIAGNOSIS — M5430 Sciatica, unspecified side: Secondary | ICD-10-CM | POA: Diagnosis not present

## 2012-11-01 ENCOUNTER — Encounter: Payer: Self-pay | Admitting: Internal Medicine

## 2012-11-01 ENCOUNTER — Ambulatory Visit (INDEPENDENT_AMBULATORY_CARE_PROVIDER_SITE_OTHER): Payer: Medicare Other | Admitting: Internal Medicine

## 2012-11-01 VITALS — BP 120/78 | HR 63 | Temp 98.0°F | Resp 16 | Ht 66.75 in | Wt 139.5 lb

## 2012-11-01 DIAGNOSIS — R5383 Other fatigue: Secondary | ICD-10-CM | POA: Diagnosis not present

## 2012-11-01 DIAGNOSIS — R5381 Other malaise: Secondary | ICD-10-CM | POA: Diagnosis not present

## 2012-11-01 DIAGNOSIS — Z79899 Other long term (current) drug therapy: Secondary | ICD-10-CM | POA: Diagnosis not present

## 2012-11-01 DIAGNOSIS — E559 Vitamin D deficiency, unspecified: Secondary | ICD-10-CM | POA: Diagnosis not present

## 2012-11-01 DIAGNOSIS — E569 Vitamin deficiency, unspecified: Secondary | ICD-10-CM

## 2012-11-01 DIAGNOSIS — M359 Systemic involvement of connective tissue, unspecified: Secondary | ICD-10-CM

## 2012-11-01 DIAGNOSIS — R609 Edema, unspecified: Secondary | ICD-10-CM | POA: Diagnosis not present

## 2012-11-01 DIAGNOSIS — J321 Chronic frontal sinusitis: Secondary | ICD-10-CM | POA: Diagnosis not present

## 2012-11-01 DIAGNOSIS — R600 Localized edema: Secondary | ICD-10-CM

## 2012-11-01 DIAGNOSIS — R0981 Nasal congestion: Secondary | ICD-10-CM | POA: Insufficient documentation

## 2012-11-01 MED ORDER — LEVOFLOXACIN 500 MG PO TABS: 500.0000 mg | ORAL_TABLET | Freq: Every day | ORAL | Status: DC

## 2012-11-01 MED ORDER — FUROSEMIDE 20 MG PO TABS: 20.0000 mg | ORAL_TABLET | Freq: Every day | ORAL | Status: DC

## 2012-11-01 NOTE — Progress Notes (Signed)
Patient ID: Sonya Cordova, female   DOB: 03-18-1946, 67 y.o.   MRN: [id number]  Patient Active Problem List  Diagnosis  . Degenerative disc disease  . Arthritis  . Connective tissue disease  . Depression  . Osteoporosis  . OA (osteoarthritis) of knee  . Postoperative anemia due to acute blood loss  . Postop Transfusion  . Edema of both legs  . Sinusitis chronic, frontal    Subjective:  CC:   Chief Complaint  Patient presents with  . Establish Care    HPI:   Sonya Cordova a 67 y.o. female who presents Chronic back pain.  She has a complicated history of DDD with a total of 17 surgeries,  With the last one one involving placement of a Stimulator put in 2010 with herrington rods. [name], from  [place])  Problems began with vertebral fractures both lumbar and cervical.  She has had bone density measured, and was told it was normal. History of  Bilateral knee replacements, the left in  August 2013, the Right x 2.  History of bilateral Shoulder replacement  as well.  Comprehensive auto immune workup by Deveshwar failed to find an identifiable diagnosis.  Bilateral hip bursitis,  Thumbs, sesamoiditis.  Both feet developing bone spurs on the top of the arch .No joint is not affected by arthritis  Pain managed by Thyra Breed.  .  Discussed referral to [facility] Rheumatology.   Her daily pain scale between 6 and 8 with her medications.  Was previously on 5 narcotics and lidoderm patches ,but her insurance will not pay for them unless the indication is post herpetic neuralgia   3) Pitting edema, managed with prn torsemide and daily compression stockings, always better in morning . Does not use torsemide daily ,  Causes several hours of diuresis whenever used,    Past Medical History  Diagnosis Date  . Degenerative disc disease 1995  . Arthritis   . Connective tissue disease   . Osteopenia 02/2012    t score -1.1  . Family history of anesthesia complication   . Complication  of anesthesia     "states remembers surgeon talking- was light"  . Hypothyroidism   . Depression   . Edema of both legs   . Headache     migraines  . History of blood transfusion   . Anemia     Past Surgical History  Procedure Date  . Back surgery     neck,, cervical, fusion 1995-2010 ( 17 surgeries)  . Vaginal hysterectomy 1986  . Thyroidectomy 1986  . Abdominal plasty   . Appendectomy   . Tonsillectomy and adenoidectomy   . Eye surgery     as a child  . Breast enhancement surgery 1978  . Tubal ligation   . Left elbow   . Left wrist   . Knee replace   . Total knee arthroplasty     2 times right  . Total shoulder replacement     2 times right shoulder  . Spinal cord stimulator implant 2011  . Hemorrhoid   . Knee arthroscopy   . Total knee arthroplasty 05/22/2012    Procedure: TOTAL KNEE ARTHROPLASTY;  Surgeon: Loanne Drilling, MD;  Location: [facility];  Service: Orthopedics;  Laterality: Left;         The following portions of the patient's history were reviewed and updated as appropriate: Allergies, current medications, and problem list.    Review of Systems:   12 Pt  review of systems was negative except those addressed in the HPI,     History   Social History  . Marital Status: Married    Spouse Name: N/A    Number of Children: N/A  . Years of Education: N/A   Occupational History  . Not on file.   Social History Main Topics  . Smoking status: Former Smoker    Quit date: 10/05/1987  . Smokeless tobacco: Never Used  . Alcohol Use: No  . Drug Use: No  . Sexually Active: Yes -- Female partner(s)   Other Topics Concern  . Not on file   Social History Narrative  . No narrative on file    Objective:  BP 120/78  Pulse 63  Temp 98 F (36.7 C) (Oral)  Resp 16  Ht 5' 6.75" (1.695 m)  Wt 139 lb 8 oz (63.277 kg)  BMI 22.01 kg/m2  SpO2 96%  General appearance: alert, cooperative and appears stated age Ears: normal TM's and external ear  canals both ears Face: bilateral frontal and maxillary sinus tenderness Throat: lips, mucosa, and tongue normal; teeth and gums normal Neck: no adenopathy, no carotid bruit, supple, symmetrical, trachea midline and thyroid not enlarged, symmetric, no tenderness/mass/nodules Back: symmetric, no curvature. ROM normal. No CVA tenderness. Lungs: clear to auscultation bilaterally Heart: regular rate and rhythm, S1, S2 normal, no murmur, click, rub or gallop Abdomen: soft, non-tender; bowel sounds normal; no masses,  no organomegaly Pulses: 2+ and symmetric Skin: Skin color, texture, turgor normal. No rashes or lesions Lymph nodes: Cervical, supraclavicular, and axillary nodes normal.  Assessment and Plan:  Edema of both legs Etiology likely venous insufficiency from prior bilateral surgeries, but will rule out nephrotic syndrome , hypothyroidism and order venous dopplers changing torsemide to furosemide .  Connective tissue disease Diffuse involvement of nearly every joint in her body.  Prior rheumatologic workup yielded no diagnosis (Deveshwar, [place]) Discussed referral to Rheumatology at [facility] tertiary care center for second opinion.    Updated Medication List Outpatient Encounter Prescriptions as of 11/01/2012  Medication Sig Dispense Refill  . diclofenac (VOLTAREN) 75 MG EC tablet Take 75 mg by mouth Twice daily.      Marland Kitchen estradiol (ESTRACE) 1 MG tablet Take 1 tablet by mouth daily.      . Eszopiclone (ESZOPICLONE) 3 MG TABS Take 3 mg by mouth at bedtime. Take immediately before bedtime. Name Brand ONLY      . gabapentin (NEURONTIN) 100 MG capsule Take 300 mg by mouth 3 (three) times daily.      Marland Kitchen levothyroxine (SYNTHROID, LEVOTHROID) 125 MCG tablet Take 125 mcg by mouth daily.      Marland Kitchen lidocaine (LIDODERM) 5 % Place 1 patch onto the skin every 12 (twelve) hours.      . methocarbamol (ROBAXIN) 500 MG tablet Take 500 mg by mouth Every 6 hours.       Marland Kitchen morphine (MSIR) 30 MG tablet Take 30 mg  by mouth every 4 (four) hours as needed. pain      . polyethylene glycol (MIRALAX / GLYCOLAX) packet Take 17 g by mouth as needed. constipation      . potassium chloride (K-DUR,KLOR-CON) 10 MEQ tablet Take 10 mEq by mouth 2 (two) times daily. With torsemide usage      . torsemide (DEMADEX) 10 MG tablet Take 5 mg by mouth daily. 5-10 mg  Prn  po      . furosemide (LASIX) 20 MG tablet Take 1 tablet (20 mg total) by  mouth daily.  30 tablet  3  . levofloxacin (LEVAQUIN) 500 MG tablet Take 1 tablet (500 mg total) by mouth daily.  7 tablet  0  . [DISCONTINUED] acetaminophen (TYLENOL) 325 MG tablet Take 2 tablets (650 mg total) by mouth every 6 (six) hours as needed (or Fever >/= 101).  60 tablet  0  . [DISCONTINUED] ARTHROTEC 75-0.2 MG TBEC Take 1 tablet by mouth Twice daily.      . [DISCONTINUED] Eszopiclone (ESZOPICLONE) 3 MG TABS Take 3 mg by mouth at bedtime. BRAND NAME ONLY- Take immediately before bedtime      . [DISCONTINUED] rivaroxaban (XARELTO) 10 MG TABS tablet Take 1 tablet (10 mg total) by mouth daily with breakfast. Take Xarelto for two and a half more weeks, then discontinue Xarelto.  18 tablet  0     Orders Placed This Encounter  Procedures  . HM MAMMOGRAPHY  . HM PAP SMEAR  . Comprehensive metabolic panel  . Vitamin D 25 hydroxy  . TSH  . Microalbumin / creatinine urine ratio  . Ambulatory referral to Vascular Surgery  . HM COLONOSCOPY    No Follow-up on file.

## 2012-11-01 NOTE — Patient Instructions (Signed)
I am treating your sinus infection with levaquin  Please take a probiotic while you are on it (Align or Florajen)  Flush sinuses twice daily with Simply Saline and use Sudafed PE for the congestion   Referral for venous dopplers of your legs underway  Trial of furosemide for prn diuretic   nonfasting labs done today

## 2012-11-01 NOTE — Assessment & Plan Note (Addendum)
Etiology likely venous insufficiency from prior bilateral surgeries, but will rule out nephrotic syndrome , hypothyroidism and order venous dopplers changing torsemide to furosemide .

## 2012-11-01 NOTE — Assessment & Plan Note (Signed)
Given chronicity of symptoms, development of facial pain and exam consistent with bacterial URI,  Will treat with empiric antibiotics, decongestants, and saline lavage.   

## 2012-11-01 NOTE — Assessment & Plan Note (Signed)
Diffuse involvement of nearly every joint in her body.  Prior rheumatologic workup yielded no diagnosis (Deveshwar, [place]) Discussed referral to Rheumatology at [facility] tertiary care center for second opinion.

## 2012-11-02 LAB — COMPREHENSIVE METABOLIC PANEL
ALT: 14 U/L (ref 0–35)
AST: 22 U/L (ref 0–37)
Albumin: 3.9 g/dL (ref 3.5–5.2)
Alkaline Phosphatase: 45 U/L (ref 39–117)
BUN: 23 mg/dL (ref 6–23)
CO2: 30 mEq/L (ref 19–32)
Calcium: 9.2 mg/dL (ref 8.4–10.5)
Chloride: 99 mEq/L (ref 96–112)
Creatinine, Ser: 0.7 mg/dL (ref 0.4–1.2)
GFR: 83.71 mL/min (ref >60.00)
Glucose, Bld: 91 mg/dL (ref 70–99)
Potassium: 4.2 mEq/L (ref 3.5–5.1)
Sodium: 136 mEq/L (ref 135–145)
Total Bilirubin: 0.7 mg/dL (ref 0.3–1.2)
Total Protein: 6.8 g/dL (ref 6.0–8.3)

## 2012-11-02 LAB — MICROALBUMIN / CREATININE URINE RATIO
Creatinine,U: 39.7 mg/dL
Microalb Creat Ratio: 0.8 mg/g (ref 0.0–30.0)
Microalb, Ur: 0.3 mg/dL (ref 0.0–1.9)

## 2012-11-02 LAB — TSH: TSH: 2.32 u[IU]/mL (ref 0.35–5.50)

## 2012-11-02 LAB — VITAMIN D 25 HYDROXY (VIT D DEFICIENCY, FRACTURES): Vit D, 25-Hydroxy: 42 ng/mL (ref 30–89)

## 2012-11-08 DIAGNOSIS — M25569 Pain in unspecified knee: Secondary | ICD-10-CM | POA: Diagnosis not present

## 2012-11-08 DIAGNOSIS — M25559 Pain in unspecified hip: Secondary | ICD-10-CM | POA: Diagnosis not present

## 2012-11-23 DIAGNOSIS — M76899 Other specified enthesopathies of unspecified lower limb, excluding foot: Secondary | ICD-10-CM | POA: Diagnosis not present

## 2012-11-28 DIAGNOSIS — M47817 Spondylosis without myelopathy or radiculopathy, lumbosacral region: Secondary | ICD-10-CM | POA: Diagnosis not present

## 2012-11-28 DIAGNOSIS — G894 Chronic pain syndrome: Secondary | ICD-10-CM | POA: Diagnosis not present

## 2012-11-28 DIAGNOSIS — M7750 Other enthesopathy of unspecified foot: Secondary | ICD-10-CM | POA: Diagnosis not present

## 2012-12-07 DIAGNOSIS — I8310 Varicose veins of unspecified lower extremity with inflammation: Secondary | ICD-10-CM | POA: Diagnosis not present

## 2012-12-07 DIAGNOSIS — I890 Lymphedema, not elsewhere classified: Secondary | ICD-10-CM | POA: Diagnosis not present

## 2012-12-07 DIAGNOSIS — M7989 Other specified soft tissue disorders: Secondary | ICD-10-CM | POA: Diagnosis not present

## 2012-12-07 DIAGNOSIS — I872 Venous insufficiency (chronic) (peripheral): Secondary | ICD-10-CM | POA: Diagnosis not present

## 2012-12-27 DIAGNOSIS — G4700 Insomnia, unspecified: Secondary | ICD-10-CM | POA: Diagnosis not present

## 2012-12-27 DIAGNOSIS — M4120 Other idiopathic scoliosis, site unspecified: Secondary | ICD-10-CM | POA: Diagnosis not present

## 2012-12-27 DIAGNOSIS — M47817 Spondylosis without myelopathy or radiculopathy, lumbosacral region: Secondary | ICD-10-CM | POA: Diagnosis not present

## 2012-12-27 DIAGNOSIS — IMO0002 Reserved for concepts with insufficient information to code with codable children: Secondary | ICD-10-CM | POA: Diagnosis not present

## 2013-01-02 ENCOUNTER — Telehealth: Payer: Self-pay | Admitting: Internal Medicine

## 2013-01-02 NOTE — Telephone Encounter (Signed)
error 

## 2013-01-18 DIAGNOSIS — M79609 Pain in unspecified limb: Secondary | ICD-10-CM | POA: Diagnosis not present

## 2013-01-18 DIAGNOSIS — M2040 Other hammer toe(s) (acquired), unspecified foot: Secondary | ICD-10-CM | POA: Diagnosis not present

## 2013-01-18 DIAGNOSIS — M7989 Other specified soft tissue disorders: Secondary | ICD-10-CM | POA: Diagnosis not present

## 2013-01-18 DIAGNOSIS — I872 Venous insufficiency (chronic) (peripheral): Secondary | ICD-10-CM | POA: Diagnosis not present

## 2013-01-18 DIAGNOSIS — M779 Enthesopathy, unspecified: Secondary | ICD-10-CM | POA: Diagnosis not present

## 2013-01-18 DIAGNOSIS — I890 Lymphedema, not elsewhere classified: Secondary | ICD-10-CM | POA: Diagnosis not present

## 2013-01-24 DIAGNOSIS — M47817 Spondylosis without myelopathy or radiculopathy, lumbosacral region: Secondary | ICD-10-CM | POA: Diagnosis not present

## 2013-01-24 DIAGNOSIS — G894 Chronic pain syndrome: Secondary | ICD-10-CM | POA: Diagnosis not present

## 2013-01-24 DIAGNOSIS — M459 Ankylosing spondylitis of unspecified sites in spine: Secondary | ICD-10-CM | POA: Diagnosis not present

## 2013-02-02 ENCOUNTER — Encounter: Payer: Self-pay | Admitting: Gynecology

## 2013-02-02 ENCOUNTER — Ambulatory Visit (INDEPENDENT_AMBULATORY_CARE_PROVIDER_SITE_OTHER): Payer: Medicare Other | Admitting: Gynecology

## 2013-02-02 VITALS — BP 118/76 | Ht 66.5 in | Wt 138.0 lb

## 2013-02-02 DIAGNOSIS — Z79890 Hormone replacement therapy (postmenopausal): Secondary | ICD-10-CM

## 2013-02-02 DIAGNOSIS — N952 Postmenopausal atrophic vaginitis: Secondary | ICD-10-CM

## 2013-02-02 MED ORDER — ESTRADIOL 1 MG PO TABS: 1.0000 mg | ORAL_TABLET | Freq: Every day | ORAL | Status: DC

## 2013-02-02 NOTE — Patient Instructions (Signed)
Follow up in one year for annual gynecologic exam. 

## 2013-02-02 NOTE — Progress Notes (Signed)
Sonya Cordova 11-29-1945 [id number]        67 y.o.  G2P2002 for followup exam.  Several issues noted below.  Past medical history,surgical history, medications, allergies, family history and social history were all reviewed and documented in the EPIC chart. ROS:  Was performed and pertinent positives and negatives are included in the history.  Exam: Kim assistant Filed Vitals:   02/02/13 1439  BP: 118/76  Height: 5' 6.5" (1.689 m)  Weight: 138 lb (62.596 kg)   General appearance  Normal Skin grossly normal Head/Neck normal with no cervical or supraclavicular adenopathy thyroid normal Lungs  clear Cardiac RR, without RMG Abdominal  soft, nontender, without masses, organomegaly or hernia Breasts  examined lying and sitting without masses, retractions, discharge or axillary adenopathy. Bilateral implants noted.  Pelvic  Ext/BUS/vagina  normal with atrophic changes  Adnexa  Without masses or tenderness    Anus and perineum  normal   Rectovaginal  normal sphincter tone without palpated masses or tenderness.    Assessment/Plan:  67 y.o. [id number] female for followup exam.   1. HRT.  Patient on estradiol 1 mg daily. She has tried to wean but has unacceptable hot flushes and night sweats. I again reviewed with her the risks of HRT to include the WHI study with the risks of stroke heart attack DVT and breast cancer. ACOG and NAMS statements for lowest dose for shortest period of time reviewed. The issue as to when to discontinue discussed. Patient understands the issues and wants to continue and I refilled her x1 year. 2. Atrophic vaginal changes. Patient is asymptomatic and we'll continue to monitor. 3. Mammography 05/2012. Continue with annual mammography. SBE monthly reviewed. 4. Osteopenia. DEXA 02/2012 with T score -1.1. Increase calcium vitamin D reviewed. Plan repeat in several years. 5. Pap smear 2012. Pap smear done today. No history of abnormal smears previously. I reviewed current  screening guidelines and as she is age 54 and status post hysterectomy for benign indication we will stop screening and she is comfortable with this. 6. Colonoscopy 2012. Plan repeat at their recommended interval. 7. Health maintenance. Actively being followed by other physicians. No lab work done today as it's done elsewhere. Followup in one year, sooner as needed.    Dara Lords MD, 3:36 PM 02/02/2013

## 2013-02-07 DIAGNOSIS — M1990 Unspecified osteoarthritis, unspecified site: Secondary | ICD-10-CM | POA: Diagnosis not present

## 2013-02-08 DIAGNOSIS — M19079 Primary osteoarthritis, unspecified ankle and foot: Secondary | ICD-10-CM | POA: Diagnosis not present

## 2013-02-16 DIAGNOSIS — M7750 Other enthesopathy of unspecified foot: Secondary | ICD-10-CM | POA: Diagnosis not present

## 2013-02-16 DIAGNOSIS — M47817 Spondylosis without myelopathy or radiculopathy, lumbosacral region: Secondary | ICD-10-CM | POA: Diagnosis not present

## 2013-02-16 DIAGNOSIS — G894 Chronic pain syndrome: Secondary | ICD-10-CM | POA: Diagnosis not present

## 2013-02-21 LAB — HM PAP SMEAR: HM Pap smear: NORMAL

## 2013-03-08 DIAGNOSIS — M722 Plantar fascial fibromatosis: Secondary | ICD-10-CM | POA: Diagnosis not present

## 2013-03-08 DIAGNOSIS — M779 Enthesopathy, unspecified: Secondary | ICD-10-CM | POA: Diagnosis not present

## 2013-03-08 DIAGNOSIS — M7750 Other enthesopathy of unspecified foot: Secondary | ICD-10-CM | POA: Diagnosis not present

## 2013-03-30 ENCOUNTER — Other Ambulatory Visit: Payer: Self-pay

## 2013-03-30 DIAGNOSIS — Z1231 Encounter for screening mammogram for malignant neoplasm of breast: Secondary | ICD-10-CM

## 2013-03-30 LAB — HM MAMMOGRAPHY: HM Mammogram: NORMAL

## 2013-04-03 DIAGNOSIS — M722 Plantar fascial fibromatosis: Secondary | ICD-10-CM | POA: Diagnosis not present

## 2013-04-13 DIAGNOSIS — M47812 Spondylosis without myelopathy or radiculopathy, cervical region: Secondary | ICD-10-CM | POA: Diagnosis not present

## 2013-04-13 DIAGNOSIS — M47817 Spondylosis without myelopathy or radiculopathy, lumbosacral region: Secondary | ICD-10-CM | POA: Diagnosis not present

## 2013-04-13 DIAGNOSIS — M5412 Radiculopathy, cervical region: Secondary | ICD-10-CM | POA: Diagnosis not present

## 2013-04-13 DIAGNOSIS — G894 Chronic pain syndrome: Secondary | ICD-10-CM | POA: Diagnosis not present

## 2013-04-17 ENCOUNTER — Telehealth: Payer: Self-pay | Admitting: Internal Medicine

## 2013-04-17 DIAGNOSIS — E039 Hypothyroidism, unspecified: Secondary | ICD-10-CM

## 2013-04-17 DIAGNOSIS — E785 Hyperlipidemia, unspecified: Secondary | ICD-10-CM

## 2013-04-17 NOTE — Telephone Encounter (Signed)
Wanting labs draw because she has been taking synthroid and she also wants her cholesterol checked

## 2013-04-17 NOTE — Telephone Encounter (Signed)
Labs ordered. Fasting lab appointment needed.

## 2013-04-17 NOTE — Telephone Encounter (Signed)
Please read below, patient requesting lab work be done.

## 2013-04-18 NOTE — Telephone Encounter (Signed)
Left message, notifying pt to call and schedule fasting labs.

## 2013-04-19 ENCOUNTER — Other Ambulatory Visit (INDEPENDENT_AMBULATORY_CARE_PROVIDER_SITE_OTHER): Payer: Medicare Other

## 2013-04-19 DIAGNOSIS — E039 Hypothyroidism, unspecified: Secondary | ICD-10-CM

## 2013-04-19 DIAGNOSIS — E785 Hyperlipidemia, unspecified: Secondary | ICD-10-CM

## 2013-04-19 LAB — LIPID PANEL
Cholesterol: 222 mg/dL — ABNORMAL HIGH (ref 0–200)
HDL: 67.1 mg/dL (ref >39.00)
Total CHOL/HDL Ratio: 3
Triglycerides: 118 mg/dL (ref 0.0–149.0)
VLDL: 23.6 mg/dL (ref 0.0–40.0)

## 2013-04-19 LAB — COMPREHENSIVE METABOLIC PANEL
ALT: 15 U/L (ref 0–35)
AST: 22 U/L (ref 0–37)
Albumin: 3.8 g/dL (ref 3.5–5.2)
Alkaline Phosphatase: 43 U/L (ref 39–117)
BUN: 22 mg/dL (ref 6–23)
CO2: 32 mEq/L (ref 19–32)
Calcium: 9.2 mg/dL (ref 8.4–10.5)
Chloride: 97 mEq/L (ref 96–112)
Creatinine, Ser: 0.8 mg/dL (ref 0.4–1.2)
GFR: 82.31 mL/min (ref >60.00)
Glucose, Bld: 88 mg/dL (ref 70–99)
Potassium: 3.7 mEq/L (ref 3.5–5.1)
Sodium: 137 mEq/L (ref 135–145)
Total Bilirubin: 0.4 mg/dL (ref 0.3–1.2)
Total Protein: 6.2 g/dL (ref 6.0–8.3)

## 2013-04-19 LAB — TSH: TSH: 3.52 u[IU]/mL (ref 0.35–5.50)

## 2013-04-19 LAB — LDL CHOLESTEROL, DIRECT: Direct LDL: 134.7 mg/dL

## 2013-04-20 ENCOUNTER — Encounter: Payer: Self-pay | Admitting: *Deleted

## 2013-04-25 ENCOUNTER — Telehealth: Payer: Self-pay | Admitting: Internal Medicine

## 2013-04-25 NOTE — Telephone Encounter (Signed)
Left message, advising pt of results and that letter was mailed 04/20/13 with results.

## 2013-04-25 NOTE — Telephone Encounter (Signed)
Pt called checking on her thyroid labs/   Also checking chol Please advise  pt needs to get refill on meds

## 2013-04-30 DIAGNOSIS — M7989 Other specified soft tissue disorders: Secondary | ICD-10-CM | POA: Diagnosis not present

## 2013-04-30 DIAGNOSIS — I890 Lymphedema, not elsewhere classified: Secondary | ICD-10-CM | POA: Diagnosis not present

## 2013-04-30 DIAGNOSIS — M79609 Pain in unspecified limb: Secondary | ICD-10-CM | POA: Diagnosis not present

## 2013-04-30 DIAGNOSIS — I872 Venous insufficiency (chronic) (peripheral): Secondary | ICD-10-CM | POA: Diagnosis not present

## 2013-05-01 DIAGNOSIS — IMO0002 Reserved for concepts with insufficient information to code with codable children: Secondary | ICD-10-CM | POA: Diagnosis not present

## 2013-05-01 DIAGNOSIS — M1710 Unilateral primary osteoarthritis, unspecified knee: Secondary | ICD-10-CM | POA: Diagnosis not present

## 2013-05-01 DIAGNOSIS — Z96659 Presence of unspecified artificial knee joint: Secondary | ICD-10-CM | POA: Diagnosis not present

## 2013-05-07 ENCOUNTER — Telehealth: Payer: Self-pay | Admitting: Internal Medicine

## 2013-05-07 MED ORDER — LEVOTHYROXINE SODIUM 125 MCG PO TABS: 125.0000 ug | ORAL_TABLET | Freq: Every day | ORAL | Status: DC

## 2013-05-07 NOTE — Telephone Encounter (Signed)
Script sent  

## 2013-05-07 NOTE — Telephone Encounter (Signed)
Pt needing refill on Synthroid 125 mcg.  Pharmacy told her she has no refills and she would have to contact us.  This had been prescribed by her previous doctor.  Pharmacy:  [facility], [place]

## 2013-05-10 ENCOUNTER — Telehealth: Payer: Self-pay | Admitting: Internal Medicine

## 2013-05-10 NOTE — Telephone Encounter (Signed)
Pt states when she picked up her medication today she received generic Synthroid.  Pt states it is supposed to be noted on her chart that she has had to take brand name Synthroid since 1986, the generic does not work for her.  Pt states her pharmacy will take the generic back and is to be sending over an order for the correction.  Pt would like for her chart to be noted so that she always only receives brand name Synthroid.

## 2013-05-11 MED ORDER — LEVOTHYROXINE SODIUM 125 MCG PO TABS: 125.0000 ug | ORAL_TABLET | Freq: Every day | ORAL | Status: DC

## 2013-05-11 NOTE — Telephone Encounter (Signed)
Name brand sent to pharmacy as requested.

## 2013-05-25 DIAGNOSIS — M19049 Primary osteoarthritis, unspecified hand: Secondary | ICD-10-CM | POA: Diagnosis not present

## 2013-05-25 DIAGNOSIS — M47812 Spondylosis without myelopathy or radiculopathy, cervical region: Secondary | ICD-10-CM | POA: Diagnosis not present

## 2013-05-25 DIAGNOSIS — G5620 Lesion of ulnar nerve, unspecified upper limb: Secondary | ICD-10-CM | POA: Diagnosis not present

## 2013-05-28 ENCOUNTER — Ambulatory Visit
Admission: RE | Admit: 2013-05-28 | Discharge: 2013-05-28 | Disposition: A | Discharge status: Home / Self Care | Payer: Medicare Other | Source: Ambulatory Visit

## 2013-05-28 DIAGNOSIS — Z1231 Encounter for screening mammogram for malignant neoplasm of breast: Secondary | ICD-10-CM | POA: Diagnosis not present

## 2013-05-29 DIAGNOSIS — M722 Plantar fascial fibromatosis: Secondary | ICD-10-CM | POA: Diagnosis not present

## 2013-05-31 DIAGNOSIS — S92919A Unspecified fracture of unspecified toe(s), initial encounter for closed fracture: Secondary | ICD-10-CM | POA: Diagnosis not present

## 2013-05-31 DIAGNOSIS — G5620 Lesion of ulnar nerve, unspecified upper limb: Secondary | ICD-10-CM | POA: Diagnosis not present

## 2013-06-01 ENCOUNTER — Other Ambulatory Visit: Payer: Self-pay | Admitting: Orthopedic Surgery

## 2013-06-01 DIAGNOSIS — IMO0002 Reserved for concepts with insufficient information to code with codable children: Secondary | ICD-10-CM | POA: Diagnosis not present

## 2013-06-01 DIAGNOSIS — M47817 Spondylosis without myelopathy or radiculopathy, lumbosacral region: Secondary | ICD-10-CM | POA: Diagnosis not present

## 2013-06-01 DIAGNOSIS — M4120 Other idiopathic scoliosis, site unspecified: Secondary | ICD-10-CM | POA: Diagnosis not present

## 2013-06-01 DIAGNOSIS — M1710 Unilateral primary osteoarthritis, unspecified knee: Secondary | ICD-10-CM | POA: Diagnosis not present

## 2013-06-01 DIAGNOSIS — G4700 Insomnia, unspecified: Secondary | ICD-10-CM | POA: Diagnosis not present

## 2013-06-07 ENCOUNTER — Encounter (HOSPITAL_BASED_OUTPATIENT_CLINIC_OR_DEPARTMENT_OTHER): Payer: Self-pay | Admitting: *Deleted

## 2013-06-07 NOTE — Progress Notes (Signed)
Pt retired or nurse Only takes diuretic as needed-not had 3 months-will not need [address]

## 2013-06-09 DIAGNOSIS — J019 Acute sinusitis, unspecified: Secondary | ICD-10-CM | POA: Diagnosis not present

## 2013-06-13 ENCOUNTER — Ambulatory Visit (HOSPITAL_BASED_OUTPATIENT_CLINIC_OR_DEPARTMENT_OTHER)
Admission: RE | Admit: 2013-06-13 | Discharge: 2013-06-13 | Disposition: A | Discharge status: Home / Self Care | Payer: Medicare Other | Source: Ambulatory Visit | Attending: Orthopedic Surgery | Admitting: Orthopedic Surgery

## 2013-06-13 ENCOUNTER — Ambulatory Visit (HOSPITAL_BASED_OUTPATIENT_CLINIC_OR_DEPARTMENT_OTHER): Payer: Medicare Other | Admitting: Anesthesiology

## 2013-06-13 ENCOUNTER — Encounter (HOSPITAL_BASED_OUTPATIENT_CLINIC_OR_DEPARTMENT_OTHER): Payer: Self-pay | Admitting: Orthopedic Surgery

## 2013-06-13 ENCOUNTER — Encounter (HOSPITAL_BASED_OUTPATIENT_CLINIC_OR_DEPARTMENT_OTHER)
Admission: RE | Disposition: A | Discharge status: Home / Self Care | Payer: Self-pay | Source: Ambulatory Visit | Attending: Orthopedic Surgery

## 2013-06-13 ENCOUNTER — Encounter (HOSPITAL_BASED_OUTPATIENT_CLINIC_OR_DEPARTMENT_OTHER): Payer: Self-pay | Admitting: Anesthesiology

## 2013-06-13 DIAGNOSIS — G5620 Lesion of ulnar nerve, unspecified upper limb: Secondary | ICD-10-CM | POA: Diagnosis not present

## 2013-06-13 HISTORY — PX: ULNAR NERVE TRANSPOSITION: SHX2595

## 2013-06-13 HISTORY — DX: Encounter for fitting and adjustment of spectacles and contact lenses: Z46.0

## 2013-06-13 HISTORY — DX: Other specified postprocedural states: Z98.890

## 2013-06-13 HISTORY — DX: Nausea with vomiting, unspecified: R11.2

## 2013-06-13 LAB — POCT HEMOGLOBIN-HEMACUE: Hemoglobin: 13.5 g/dL (ref 12.0–15.0)

## 2013-06-13 SURGERY — ULNAR NERVE DECOMPRESSION/TRANSPOSITION
Anesthesia: General | Site: Elbow | Laterality: Left | Wound class: Clean

## 2013-06-13 MED ORDER — MIDAZOLAM HCL 2 MG/2ML IJ SOLN: 1.0000 mg | INTRAMUSCULAR | Status: DC | PRN

## 2013-06-13 MED ORDER — FENTANYL CITRATE 0.05 MG/ML IJ SOLN
INTRAMUSCULAR | Status: DC | PRN
  Administered 2013-06-13: 100 ug via INTRAVENOUS

## 2013-06-13 MED ORDER — CHLORHEXIDINE GLUCONATE 4 % EX LIQD: 60.0000 mL | Freq: Once | CUTANEOUS | Status: DC

## 2013-06-13 MED ORDER — OXYCODONE HCL 5 MG PO TABS: 5.0000 mg | ORAL_TABLET | Freq: Once | ORAL | Status: DC | PRN

## 2013-06-13 MED ORDER — SCOPOLAMINE 1 MG/3DAYS TD PT72
1.0000 | MEDICATED_PATCH | TRANSDERMAL | Status: DC
  Administered 2013-06-13: 1.5 mg via TRANSDERMAL

## 2013-06-13 MED ORDER — ONDANSETRON HCL 4 MG/2ML IJ SOLN
INTRAMUSCULAR | Status: DC | PRN
  Administered 2013-06-13: 4 mg via INTRAVENOUS

## 2013-06-13 MED ORDER — OXYCODONE HCL 5 MG/5ML PO SOLN: 5.0000 mg | Freq: Once | ORAL | Status: DC | PRN

## 2013-06-13 MED ORDER — HYDROCODONE-ACETAMINOPHEN 5-325 MG PO TABS: 1.0000 | ORAL_TABLET | Freq: Four times a day (QID) | ORAL | Status: DC | PRN

## 2013-06-13 MED ORDER — FENTANYL CITRATE 0.05 MG/ML IJ SOLN: 50.0000 ug | INTRAMUSCULAR | Status: DC | PRN

## 2013-06-13 MED ORDER — BUPIVACAINE HCL (PF) 0.25 % IJ SOLN
INTRAMUSCULAR | Status: DC | PRN
  Administered 2013-06-13: 8 mL

## 2013-06-13 MED ORDER — HYDROMORPHONE HCL PF 1 MG/ML IJ SOLN
0.2500 mg | INTRAMUSCULAR | Status: DC | PRN
  Administered 2013-06-13 (×2): 0.5 mg via INTRAVENOUS

## 2013-06-13 MED ORDER — PROPOFOL 10 MG/ML IV BOLUS
INTRAVENOUS | Status: DC | PRN
  Administered 2013-06-13: 200 mg via INTRAVENOUS

## 2013-06-13 MED ORDER — ONDANSETRON HCL 4 MG/2ML IJ SOLN: 4.0000 mg | Freq: Once | INTRAMUSCULAR | Status: DC | PRN

## 2013-06-13 MED ORDER — LACTATED RINGERS IV SOLN
INTRAVENOUS | Status: DC
  Administered 2013-06-13 (×2): via INTRAVENOUS

## 2013-06-13 MED ORDER — DEXAMETHASONE SODIUM PHOSPHATE 10 MG/ML IJ SOLN
INTRAMUSCULAR | Status: DC | PRN
  Administered 2013-06-13: 10 mg via INTRAVENOUS

## 2013-06-13 MED ORDER — VANCOMYCIN HCL IN DEXTROSE 1-5 GM/200ML-% IV SOLN
1000.0000 mg | INTRAVENOUS | Status: AC
  Administered 2013-06-13 (×2): 1000 mg via INTRAVENOUS

## 2013-06-13 MED ORDER — MIDAZOLAM HCL 5 MG/5ML IJ SOLN
INTRAMUSCULAR | Status: DC | PRN
  Administered 2013-06-13: 2 mg via INTRAVENOUS

## 2013-06-13 MED ORDER — LIDOCAINE HCL (CARDIAC) 20 MG/ML IV SOLN
INTRAVENOUS | Status: DC | PRN
  Administered 2013-06-13: 100 mg via INTRAVENOUS

## 2013-06-13 SURGICAL SUPPLY — 51 items
BANDAGE GAUZE ELAST BULKY 4 IN (GAUZE/BANDAGES/DRESSINGS) ×3 IMPLANT
BLADE MINI RND TIP GREEN BEAV (BLADE) IMPLANT
BLADE SURG 15 STRL LF DISP TIS (BLADE) ×1 IMPLANT
BLADE SURG 15 STRL SS (BLADE) ×2
BNDG CMPR 9X4 STRL LF SNTH (GAUZE/BANDAGES/DRESSINGS) ×1
BNDG COHESIVE 3X5 TAN STRL LF (GAUZE/BANDAGES/DRESSINGS) ×4 IMPLANT
BNDG ESMARK 4X9 LF (GAUZE/BANDAGES/DRESSINGS) ×1 IMPLANT
CHLORAPREP W/TINT 26ML (MISCELLANEOUS) ×2 IMPLANT
CLOTH BEACON ORANGE TIMEOUT ST (SAFETY) ×2 IMPLANT
CORDS BIPOLAR (ELECTRODE) ×2 IMPLANT
COVER MAYO STAND STRL (DRAPES) ×2 IMPLANT
COVER TABLE BACK 60X90 (DRAPES) ×2 IMPLANT
CUFF TOURNIQUET SINGLE 18IN (TOURNIQUET CUFF) ×1 IMPLANT
DECANTER SPIKE VIAL GLASS SM (MISCELLANEOUS) IMPLANT
DRAPE EXTREMITY T 121X128X90 (DRAPE) ×2 IMPLANT
DRAPE SURG 17X23 STRL (DRAPES) ×2 IMPLANT
DRSG KUZMA FLUFF (GAUZE/BANDAGES/DRESSINGS) IMPLANT
DRSG PAD ABDOMINAL 8X10 ST (GAUZE/BANDAGES/DRESSINGS) ×2 IMPLANT
GAUZE SPONGE 4X4 16PLY XRAY LF (GAUZE/BANDAGES/DRESSINGS) IMPLANT
GAUZE XEROFORM 1X8 LF (GAUZE/BANDAGES/DRESSINGS) ×2 IMPLANT
GLOVE BIO SURGEON STRL SZ 6.5 (GLOVE) ×2 IMPLANT
GLOVE BIOGEL PI IND STRL 8.5 (GLOVE) ×1 IMPLANT
GLOVE BIOGEL PI INDICATOR 8.5 (GLOVE) ×1
GLOVE SURG ORTHO 8.0 STRL STRW (GLOVE) ×2 IMPLANT
GOWN BRE IMP PREV XXLGXLNG (GOWN DISPOSABLE) ×2 IMPLANT
GOWN PREVENTION PLUS XLARGE (GOWN DISPOSABLE) ×2 IMPLANT
LOOP VESSEL MAXI BLUE (MISCELLANEOUS) IMPLANT
NDL SUT 6 .5 CRC .975X.05 MAYO (NEEDLE) IMPLANT
NEEDLE 27GAX1X1/2 (NEEDLE) IMPLANT
NEEDLE MAYO TAPER (NEEDLE)
NS IRRIG 1000ML POUR BTL (IV SOLUTION) ×2 IMPLANT
PACK BASIN DAY SURGERY FS (CUSTOM PROCEDURE TRAY) ×2 IMPLANT
PAD CAST 3X4 CTTN HI CHSV (CAST SUPPLIES) ×1 IMPLANT
PAD CAST 4YDX4 CTTN HI CHSV (CAST SUPPLIES) ×1 IMPLANT
PADDING CAST ABS 4INX4YD NS (CAST SUPPLIES) ×1
PADDING CAST ABS COTTON 4X4 ST (CAST SUPPLIES) ×1 IMPLANT
PADDING CAST COTTON 3X4 STRL (CAST SUPPLIES) ×2
PADDING CAST COTTON 4X4 STRL (CAST SUPPLIES) ×2
SLEEVE SCD COMPRESS KNEE MED (MISCELLANEOUS) ×1 IMPLANT
SPLINT PLASTER CAST XFAST 3X15 (CAST SUPPLIES) IMPLANT
SPLINT PLASTER XTRA FASTSET 3X (CAST SUPPLIES)
SPONGE GAUZE 4X4 12PLY (GAUZE/BANDAGES/DRESSINGS) ×2 IMPLANT
STOCKINETTE 4X48 STRL (DRAPES) ×2 IMPLANT
SUT VIC AB 2-0 SH 27 (SUTURE) ×2
SUT VIC AB 2-0 SH 27XBRD (SUTURE) ×1 IMPLANT
SUT VICRYL 4-0 PS2 18IN ABS (SUTURE) IMPLANT
SUT VICRYL RAPIDE 4/0 PS 2 (SUTURE) ×2 IMPLANT
SYR BULB 3OZ (MISCELLANEOUS) ×2 IMPLANT
SYR CONTROL 10ML LL (SYRINGE) ×2 IMPLANT
TOWEL OR 17X24 6PK STRL BLUE (TOWEL DISPOSABLE) ×2 IMPLANT
UNDERPAD 30X30 INCONTINENT (UNDERPADS AND DIAPERS) ×2 IMPLANT

## 2013-06-13 NOTE — Anesthesia Postprocedure Evaluation (Signed)
  Anesthesia Post-op Note  Patient: Sonya Cordova  Procedure(s) Performed: Procedure(s): DECOMPRESSION, POSSIBLE TRANSPOSITION LEFT ULNAR NERVE (Left)  Patient Location: PACU  Anesthesia Type:General  Level of Consciousness: awake, alert  and oriented  Airway and Oxygen Therapy: Patient Spontanous Breathing  Post-op Pain: mild  Post-op Assessment: Post-op Vital signs reviewed  Post-op Vital Signs: Reviewed  Complications: No apparent anesthesia complications

## 2013-06-13 NOTE — Anesthesia Procedure Notes (Signed)
Procedure Name: LMA Insertion Date/Time: 06/13/2013 8:41 AM Performed by: Caren Macadam Pre-anesthesia Checklist: Patient identified, Emergency Drugs available, Suction available and Patient being monitored Patient Re-evaluated:Patient Re-evaluated prior to inductionOxygen Delivery Method: Circle System Utilized Preoxygenation: Pre-oxygenation with 100% oxygen Intubation Type: IV induction Ventilation: Mask ventilation without difficulty LMA: LMA inserted LMA Size: 4.0 Number of attempts: 1 Airway Equipment and Method: bite block Placement Confirmation: positive ETCO2 and breath sounds checked- equal and bilateral Tube secured with: Tape Dental Injury: Teeth and Oropharynx as per pre-operative assessment

## 2013-06-13 NOTE — Brief Op Note (Signed)
06/13/2013  9:28 AM  PATIENT:  Ellis Parents  67 y.o. female  PRE-OPERATIVE DIAGNOSIS:  CUBITAL TUNNEL LEFT ELBOW  POST-OPERATIVE DIAGNOSIS:  CUBITAL TUNNEL LEFT ELBOW  PROCEDURE:  Procedure(s): DECOMPRESSION, POSSIBLE TRANSPOSITION LEFT ULNAR NERVE (Left)  SURGEON:  Surgeon(s) and Role:    * Nicki Reaper, MD - Primary  PHYSICIAN ASSISTANT:   ASSISTANTS: none   ANESTHESIA:   local and general  EBL:  Total I/O In: 1000 [I.V.:1000] Out: -   BLOOD ADMINISTERED:none  DRAINS: none   LOCAL MEDICATIONS USED:  MARCAINE     SPECIMEN:  No Specimen  DISPOSITION OF SPECIMEN:  none  COUNTS:  YES  TOURNIQUET:   Total Tourniquet Time Documented: Upper Arm (Left) - 28 minutes Total: Upper Arm (Left) - 28 minutes   DICTATION: .Other Dictation: Dictation Number [id number]  PLAN OF CARE: Discharge to home after PACU  PATIENT DISPOSITION:  PACU - hemodynamically stable.

## 2013-06-13 NOTE — H&P (Signed)
Sonya Cordova is a 67 year old right hand dominant female who comes in complaining of pain, numbness and tingling right middle, ring and little fingers. This has been going on for the past 2 months. She has a history of 4 cervical fusion but she does not know exactly which levels they were. She had radial head replacement on her left side following a fracture in 73. This was removed in 74. She is complaining of arthritic changes and problems in multiple joints, neck, elbow, wrist on both sides. She states bending her elbow into flexion causes increased symptoms. All of her fingers are numb in the morning. The numbness and tingling ring and little fingers is virtually constant. She has no new injury to either the hands or neck. She has no history of diabetes. She is on Diclofenac, Synthroid, Gabapentin, and Robaxin.  She has had her nerve conductions done revealing significant amplitude diminution of conduction velocity diminution of her left ulnar nerve. This is normal on her right side.   PAST MEDICAL HISTORY: She is allergic to Cleocin, Augmentin and Codeine. She is on Morphine, Synthroid, Estradiol, Gabapentin, Robaxin, and Lunesta.  She has had 17 back and neck surgeries, a right knee prosthesis, left knee prosthesis, shoulder prosthesis on her right.  FAMILY H ISTORY: Positive for heart disease, high BP and arthritis.  SOCIAL HISTORY: She does not smoke or drink.   REVIEW OF SYSTEMS: Positive for contacts, arthritis, otherwise negative. Sonya Cordova is an 67 y.o. female.   Chief Complaint: cubital tunnel left HPI: see above  Past Medical History  Diagnosis Date  . Degenerative disc disease 1995  . Arthritis   . Connective tissue disease   . Osteopenia 02/2012    t score -1.1  . Family history of anesthesia complication   . Complication of anesthesia     "states remembers surgeon talking- was light"  . Hypothyroidism   . Edema of both legs   . Headache(784.0)     migraines  .  History of blood transfusion   . Anemia   . PONV (postoperative nausea and vomiting)   . Contact lens/glasses fitting     wears contacts or glasses    Past Surgical History  Procedure Laterality Date  . Back surgery      neck,, cervical, fusion 1995-2010 ( 17 surgeries)  . Vaginal hysterectomy  1986  . Thyroidectomy  1986  . Abdominal plasty    . Appendectomy    . Tonsillectomy and adenoidectomy    . Eye surgery      as a child  . Breast enhancement surgery  1978  . Tubal ligation    . Left elbow    . Left wrist    . Knee replace    . Total knee arthroplasty      2 times right  . Total shoulder replacement      2 times right shoulder  . Spinal cord stimulator implant  2011  . Hemorrhoid    . Knee arthroscopy    . Total knee arthroplasty  05/22/2012    Procedure: TOTAL KNEE ARTHROPLASTY;  Surgeon: Loanne Drilling, MD;  Location: [facility];  Service: Orthopedics;  Laterality: Left;    Family History  Problem Relation Age of Onset  . Hypertension Mother   . Heart disease Mother   . Pancreatic cancer Mother   . Skin cancer Mother   . Rheum arthritis Mother   . Hypertension Father   . Heart disease Father   .  Skin cancer Father   . Osteoarthritis Father   . Hypertension Sister   . Stroke Sister   . Heart disease Sister   . Thyroid disease Sister   . Hypertension Brother   . Osteoarthritis Brother   . Skin cancer Sister   . Osteoarthritis Sister    Social History:  reports that she quit smoking about 25 years ago. She has never used smokeless tobacco. She reports that she does not drink alcohol or use illicit drugs.  Allergies:  Allergies  Allergen Reactions  . Cleocin [Clindamycin Hcl] Anaphylaxis  . Amoxicillin-Pot Clavulanate Hives    augumentin  . Codeine Nausea And Vomiting    No prescriptions prior to admission    No results found for this or any previous visit (from the past 48 hour(s)).  No results found.   Pertinent items are noted in  HPI.  Height 5' 6.5" (1.689 m), weight 138 lb (62.596 kg).  General appearance: alert, cooperative and appears stated age Head: Normocephalic, without obvious abnormality Neck: no JVD Resp: clear to auscultation bilaterally Cardio: regular rate and rhythm, S1, S2 normal, no murmur, click, rub or gallop GI: soft, non-tender; bowel sounds normal; no masses,  no organomegaly Extremities: extremities normal, atraumatic, no cyanosis or edema Pulses: 2+ and symmetric Skin: Skin color, texture, turgor normal. No rashes or lesions Neurologic: Grossly normal Incision/Wound: na  Assessment/Plan We have discussed the possibility of decompression of the nerve, possible transposition. The pre, peri and post op course are discussed along with risks and complications.  She is aware we are trying to halt the process to allow this to get better. She is aware there is no guarantee with surgery, possibility of infection, recurrence, injury to arteries, nerves and tendons, incomplete relief of symptoms and dystrophy.  She is scheduled for decompression, probable transposition ulnar nerve as an outpatient under regional anesthesia. Questions were invited and answered in detail.   Ashritha Desrosiers R 06/13/2013, 5:36 AM

## 2013-06-13 NOTE — Transfer of Care (Signed)
Immediate Anesthesia Transfer of Care Note  Patient: Sonya Cordova  Procedure(s) Performed: Procedure(s): DECOMPRESSION, POSSIBLE TRANSPOSITION LEFT ULNAR NERVE (Left)  Patient Location: PACU  Anesthesia Type:General  Level of Consciousness: sedated  Airway & Oxygen Therapy: Patient Spontanous Breathing and Patient connected to face mask oxygen  Post-op Assessment: Report given to PACU RN and Post -op Vital signs reviewed and stable  Post vital signs: Reviewed and stable  Complications: No apparent anesthesia complications

## 2013-06-13 NOTE — Op Note (Signed)
Dictation Number [id number]

## 2013-06-13 NOTE — Anesthesia Preprocedure Evaluation (Signed)
Anesthesia Evaluation  Patient identified by MRN, date of birth, ID band Patient awake    Reviewed: Allergy & Precautions, H&P , NPO status , Patient's Chart, lab work & pertinent test results  History of Anesthesia Complications (+) PONV  Airway Mallampati: I TM Distance: >3 FB Neck ROM: Full    Dental  (+) Teeth Intact and Dental Advisory Given   Pulmonary  breath sounds clear to auscultation        Cardiovascular Rhythm:Regular Rate:Normal     Neuro/Psych    GI/Hepatic   Endo/Other    Renal/GU      Musculoskeletal   Abdominal   Peds  Hematology   Anesthesia Other Findings   Reproductive/Obstetrics                           Anesthesia Physical Anesthesia Plan  ASA: II  Anesthesia Plan: General   Post-op Pain Management:    Induction: Intravenous  Airway Management Planned: LMA  Additional Equipment:   Intra-op Plan:   Post-operative Plan: Extubation in OR  Informed Consent: I have reviewed the patients History and Physical, chart, labs and discussed the procedure including the risks, benefits and alternatives for the proposed anesthesia with the patient or authorized representative who has indicated his/her understanding and acceptance.   Dental advisory given  Plan Discussed with: CRNA, Anesthesiologist and Surgeon  Anesthesia Plan Comments:         Anesthesia Quick Evaluation

## 2013-06-13 NOTE — Op Note (Signed)
[name], [name]           ACCOUNT NO.:  [id number]  MEDICAL RECORD NO.:  [id number]  LOCATION:                                 FACILITY:  PHYSICIAN:  Cindee Salt, M.D.            DATE OF BIRTH:  DATE OF PROCEDURE:  06/13/2013 DATE OF DISCHARGE:                              OPERATIVE REPORT   PREOPERATIVE DIAGNOSIS:  Cubital tunnel syndrome, left elbow, ulnar nerve.  POSTOPERATIVE DIAGNOSIS:  Cubital tunnel syndrome, left elbow, ulnar nerve.  OPERATION:  Decompression of ulnar nerve, left elbow.  SURGEON:  Cindee Salt, MD  ANESTHESIA:  General with local infiltration.  ANESTHESIOLOGIST:  Sheldon Silvan, MD  HISTORY:  The patient is a 67 year old female with a history of cubital tunnel syndrome, left elbow.  This has not responded to conservative treatment.  Nerve conductions are positive.  She is admitted for a decompression, possible transposition to the ulnar nerve.  Pre, peri, and postoperative course have been discussed along with risks and complications.  She is aware that there is no guarantee with the surgery; possibility of infection; recurrence of injury to arteries, nerves, tendons; incomplete relief of symptoms; dystrophy with possibility of transposition.  In the preoperative area, the patient was seen.  The extremity marked by both patient and surgeon.  Antibiotic given.  DESCRIPTION OF PROCEDURE:  The patient was brought to the operating room where a general anesthetic was carried out without difficulty.  She was prepped using ChloraPrep, supine position, left arm free.  A 3-minute dry time was allowed.  Time-out taken, confirming the patient and procedure.  The limb was exsanguinated with an Esmarch bandage. Tourniquet placed high on the arm and was inflated to 250 mmHg.  A longitudinal incision was made directly over the medial epicondyle and carried down through subcutaneous tissue.  The posterior branch of the medial antebrachial cutaneous nerve of  the forearm were identified. These were protected.  The dissection was carried down to the posterior aspect of Osborne fascia.  This was released.  Right angle Sewell retractors and knee retractors were placed distally after dissecting the subcutaneous tissue from the flexor carpi ulnaris forearm fascia.  The fascia was split for approximately 6 cm distally.  The muscle belly was then split.  The deep fascia released using a KMI retractor for carpal tunnel release along with angled ENT scissors.  Similarly, the fascia proximally was released taking care to protect neurovascular structures. The KMI skid was placed between the nerve and the fascia and the deep fascia released up to the arcade of Struthers proximally with the ENT scissors.  The elbow was fully flexed.  The nerve did not dislocate.  It was copiously irrigated with saline.  The Osborne fascia was then sutured to the posterior skin flap to maintain the nerve posterior. This was done with figure-of-eight 2-0 Vicryl sutures.  Subcutaneous tissue was closed with interrupted 2-0 Vicryl and the skin with a subcuticular 4-0 Vicryl Rapide.  Sterile compressive dressing and splint applied posteriorly to full extension which was approximately 45 degrees. On deflation of the tourniquet, all fingers immediately pinked. Tourniquet was inflated to 250 mmHg after exsanguination of the limb with an  Esmarch bandage.  The patient tolerated the procedure well.  She will be discharged home to return in 1 week on Norco.          ______________________________ Cindee Salt, M.D.     GK/MEDQ  D:  06/13/2013  T:  06/13/2013  Job:  [id number]

## 2013-06-14 ENCOUNTER — Encounter (HOSPITAL_BASED_OUTPATIENT_CLINIC_OR_DEPARTMENT_OTHER): Payer: Self-pay | Admitting: Orthopedic Surgery

## 2013-06-21 ENCOUNTER — Ambulatory Visit (INDEPENDENT_AMBULATORY_CARE_PROVIDER_SITE_OTHER): Payer: Medicare Other | Admitting: *Deleted

## 2013-06-21 DIAGNOSIS — Z23 Encounter for immunization: Secondary | ICD-10-CM

## 2013-06-29 DIAGNOSIS — G894 Chronic pain syndrome: Secondary | ICD-10-CM | POA: Diagnosis not present

## 2013-06-29 DIAGNOSIS — M47817 Spondylosis without myelopathy or radiculopathy, lumbosacral region: Secondary | ICD-10-CM | POA: Diagnosis not present

## 2013-06-29 DIAGNOSIS — M2550 Pain in unspecified joint: Secondary | ICD-10-CM | POA: Diagnosis not present

## 2013-08-10 ENCOUNTER — Ambulatory Visit (INDEPENDENT_AMBULATORY_CARE_PROVIDER_SITE_OTHER): Payer: Medicare Other | Admitting: *Deleted

## 2013-08-10 DIAGNOSIS — Z23 Encounter for immunization: Secondary | ICD-10-CM | POA: Diagnosis not present

## 2013-08-27 DIAGNOSIS — Z79899 Other long term (current) drug therapy: Secondary | ICD-10-CM | POA: Diagnosis not present

## 2013-08-27 DIAGNOSIS — IMO0002 Reserved for concepts with insufficient information to code with codable children: Secondary | ICD-10-CM | POA: Diagnosis not present

## 2013-08-27 DIAGNOSIS — G894 Chronic pain syndrome: Secondary | ICD-10-CM | POA: Diagnosis not present

## 2013-08-27 DIAGNOSIS — M47817 Spondylosis without myelopathy or radiculopathy, lumbosacral region: Secondary | ICD-10-CM | POA: Diagnosis not present

## 2013-08-27 DIAGNOSIS — M129 Arthropathy, unspecified: Secondary | ICD-10-CM | POA: Diagnosis not present

## 2013-09-20 ENCOUNTER — Ambulatory Visit (INDEPENDENT_AMBULATORY_CARE_PROVIDER_SITE_OTHER): Payer: Medicare Other | Admitting: Internal Medicine

## 2013-09-20 ENCOUNTER — Encounter (INDEPENDENT_AMBULATORY_CARE_PROVIDER_SITE_OTHER): Payer: Self-pay

## 2013-09-20 VITALS — BP 132/80 | HR 60 | Temp 97.5°F | Wt 140.0 lb

## 2013-09-20 DIAGNOSIS — M26609 Unspecified temporomandibular joint disorder, unspecified side: Secondary | ICD-10-CM | POA: Diagnosis not present

## 2013-09-20 DIAGNOSIS — E039 Hypothyroidism, unspecified: Secondary | ICD-10-CM | POA: Diagnosis not present

## 2013-09-20 DIAGNOSIS — D62 Acute posthemorrhagic anemia: Secondary | ICD-10-CM

## 2013-09-20 DIAGNOSIS — J321 Chronic frontal sinusitis: Secondary | ICD-10-CM

## 2013-09-20 DIAGNOSIS — Z79899 Other long term (current) drug therapy: Secondary | ICD-10-CM

## 2013-09-20 DIAGNOSIS — M130 Polyarthritis, unspecified: Secondary | ICD-10-CM | POA: Insufficient documentation

## 2013-09-20 LAB — TSH: TSH: 1.43 u[IU]/mL (ref 0.35–5.50)

## 2013-09-20 LAB — COMPREHENSIVE METABOLIC PANEL
ALT: 18 U/L (ref 0–35)
AST: 25 U/L (ref 0–37)
Albumin: 4.1 g/dL (ref 3.5–5.2)
Alkaline Phosphatase: 48 U/L (ref 39–117)
BUN: 20 mg/dL (ref 6–23)
CO2: 33 mEq/L — ABNORMAL HIGH (ref 19–32)
Calcium: 9.3 mg/dL (ref 8.4–10.5)
Chloride: 102 mEq/L (ref 96–112)
Creatinine, Ser: 0.7 mg/dL (ref 0.4–1.2)
GFR: 87.57 mL/min (ref >60.00)
Glucose, Bld: 83 mg/dL (ref 70–99)
Potassium: 4.2 mEq/L (ref 3.5–5.1)
Sodium: 139 mEq/L (ref 135–145)
Total Bilirubin: 0.5 mg/dL (ref 0.3–1.2)
Total Protein: 6.4 g/dL (ref 6.0–8.3)

## 2013-09-20 LAB — C-REACTIVE PROTEIN: CRP: <0.5 mg/dL (ref 0.5–20.0)

## 2013-09-20 LAB — SEDIMENTATION RATE: Sed Rate: 8 mm/hr (ref 0–22)

## 2013-09-20 MED ORDER — TIZANIDINE HCL 4 MG PO TABS: 4.0000 mg | ORAL_TABLET | Freq: Four times a day (QID) | ORAL | Status: DC | PRN

## 2013-09-20 MED ORDER — PREDNISONE (PAK) 10 MG PO TABS: ORAL_TABLET | ORAL | Status: DC

## 2013-09-20 NOTE — Progress Notes (Signed)
Patient ID: Sonya Cordova, female   DOB: [birth year], 67 y.o.   MRN: [id number]  Patient Active Problem List   Diagnosis Date Noted  . TMJ (temporomandibular joint syndrome) 09/23/2013  . Polyarthritis 09/20/2013  . Unspecified hypothyroidism 09/20/2013  . Edema of both legs 11/01/2012  . Congestion of nasal sinus 11/01/2012  . Postop Transfusion 05/25/2012  . Postoperative anemia due to acute blood loss 05/23/2012  . OA (osteoarthritis) of knee 05/22/2012  . Degenerative disc disease   . Arthritis   . Connective tissue disease   . Depression   . Osteoporosis     Subjective:  CC:   Chief Complaint  Patient presents with  . Fatigue    HPI:   Sonya Kil Fergusonis a 67 y.o. female who presents with Multiple complaints. Last seen January 2014.   Brittle nails, seem to be splitting a lot.  Does not get manicures or use nail polish .  No contact with heavy detergents or frequent soakings.  No dietary avoidances,  Has an omnivorous diet.    Fatigue, all the time.  No snoring or daytime somnolence.  Does not exercise.  No weight gain.   Polyarthritis involving shoulders, back ,  hips , knees wrists.  History of 17 surgeries including shoulder and knee replacements and multi[ple cervical and lumbar spine surgeries.   TMJ left side .  Takes robaxin routinely and diclofenac regularly  with no appreciable improvement in symptoms  Cannot open mouth wide enough to eat a sandwhic. Last workup for autoimmune was over 64yrs ago by Pain [facility]    Head cold.  Using simply saline and a decongestant.  Head is still congested but not febrile and no purulent discharge        Past Medical History  Diagnosis Date  . Degenerative disc disease 1995  . Arthritis   . Connective tissue disease   . Osteopenia 02/2012    t score -1.1  . Family history of anesthesia complication   . Complication of anesthesia     "states remembers surgeon talking- was light"  . Hypothyroidism   . Edema of both  legs   . Headache(784.0)     migraines  . History of blood transfusion   . Anemia   . PONV (postoperative nausea and vomiting)   . Contact lens/glasses fitting     wears contacts or glasses    Past Surgical History  Procedure Laterality Date  . Back surgery      neck,, cervical, fusion 1995-2010 ( 17 surgeries)  . Vaginal hysterectomy  1986  . Thyroidectomy  1986  . Abdominal plasty    . Appendectomy    . Tonsillectomy and adenoidectomy    . Eye surgery      as a child  . Breast enhancement surgery  1978  . Tubal ligation    . Left elbow    . Left wrist    . Knee replace    . Total knee arthroplasty      2 times right  . Total shoulder replacement      2 times right shoulder  . Spinal cord stimulator implant  2011  . Hemorrhoid    . Knee arthroscopy    . Total knee arthroplasty  05/22/2012    Procedure: TOTAL KNEE ARTHROPLASTY;  Surgeon: Loanne Drilling, MD;  Location: [facility];  Service: Orthopedics;  Laterality: Left;  . Ulnar nerve transposition Left 06/13/2013    Procedure: DECOMPRESSION, POSSIBLE TRANSPOSITION LEFT ULNAR NERVE;  Surgeon: Nicki Reaper, MD;  Location: [facility];  Service: Orthopedics;  Laterality: Left;       The following portions of the patient's history were reviewed and updated as appropriate: Allergies, current medications, and problem list.    Review of Systems:   12 Pt  review of systems was negative except those addressed in the HPI.    History   Social History  . Marital Status: Married    Spouse Name: N/A    Number of Children: N/A  . Years of Education: N/A   Occupational History  . Not on file.   Social History Main Topics  . Smoking status: Former Smoker    Quit date: 10/05/1987  . Smokeless tobacco: Never Used  . Alcohol Use: No  . Drug Use: No  . Sexual Activity: Yes    Partners: Male    Birth Control/ Protection: Post-menopausal   Other Topics Concern  . Not on file   Social History Narrative   . No narrative on file    Objective:  Filed Vitals:   09/20/13 1046  BP: 132/80  Pulse: 60  Temp: 97.5 F (36.4 C)     General appearance: alert, cooperative and appears stated age Ears: normal TM's and external ear canals both ears Throat: lips, mucosa, and tongue normal; teeth and gums normal Neck: no adenopathy, no carotid bruit, supple, symmetrical, trachea midline and thyroid not enlarged, symmetric, no tenderness/mass/nodules Back: symmetric, no curvature. ROM normal. No CVA tenderness. Lungs: clear to auscultation bilaterally Heart: regular rate and rhythm, S1, S2 normal, no murmur, click, rub or gallop Abdomen: soft, non-tender; bowel sounds normal; no masses,  no organomegaly Pulses: 2+ and symmetric Skin: Skin color, texture, turgor normal. No rashes or lesions Lymph nodes: Cervical, supraclavicular, and axillary nodes normal.  Assessment and Plan:  Polyarthritis Repeat workup for autoimmune etiologies was notable for normal CRP, RA factor and ESR, and negative ANA.  She has had multiple joint replacements (knees, shoulders) back surgeries,  s well as carpal tunnel release left wrist Sept 2014 and chronic pain managed by [facility].   TMJ (temporomandibular joint syndrome) Continue diclofenac and try alternative MR.  Tizanidine prescribed  Congestion of nasal sinus She has no signs of bacterial infection.  Given failure to improve with prior therapies,  will treat with prednisone taper and reassess  Postoperative anemia due to acute blood loss She has not had a CBC since August 2013 due to infrequent follow up but hgb checked < 30 months ago was normal at 13.5 .  Will add CBC and iron studies to next blood draw.   Unspecified hypothyroidism Thyroid function is WNL on current dose.  No current changes needed.    Updated Medication List Outpatient Encounter Prescriptions as of 09/20/2013  Medication Sig  . diclofenac (VOLTAREN) 75 MG EC tablet Take 75 mg by  mouth Twice daily.  Marland Kitchen estradiol (ESTRACE) 1 MG tablet Take 1 tablet (1 mg total) by mouth daily.  . Eszopiclone (ESZOPICLONE) 3 MG TABS Take 3 mg by mouth at bedtime. Take immediately before bedtime. Name Brand ONLY  . gabapentin (NEURONTIN) 100 MG capsule Take 300 mg by mouth 3 (three) times daily.  Marland Kitchen levothyroxine (SYNTHROID, LEVOTHROID) 125 MCG tablet Take 1 tablet (125 mcg total) by mouth daily.  . methocarbamol (ROBAXIN) 500 MG tablet Take 500 mg by mouth Every 6 hours.   Marland Kitchen morphine (MSIR) 30 MG tablet Take 60 mg by mouth every 6 (six) hours as  needed (takes 3xdaily). pain  . polyethylene glycol (MIRALAX / GLYCOLAX) packet Take 17 g by mouth as needed. constipation  . potassium chloride (K-DUR,KLOR-CON) 10 MEQ tablet Take 10 mEq by mouth 2 (two) times daily. With torsemide usage  . torsemide (DEMADEX) 10 MG tablet Take 5 mg by mouth as needed. 5-10 mg  Prn  po  . predniSONE (STERAPRED UNI-PAK) 10 MG tablet 6 tablets on Day 1 , then reduce by 1 tablet daily until gone  . tiZANidine (ZANAFLEX) 4 MG tablet Take 1 tablet (4 mg total) by mouth every 6 (six) hours as needed for muscle spasms.  . [DISCONTINUED] HYDROcodone-acetaminophen (NORCO) 5-325 MG per tablet Take 1 tablet by mouth every 6 (six) hours as needed for pain.     Orders Placed This Encounter  Procedures  . HM MAMMOGRAPHY  . Sedimentation rate  . Comprehensive metabolic panel  . HLA-B27 antigen  . ANA w/Reflex if Positive  . Rheumatoid factor  . C-reactive protein  . TSH  . Comprehensive metabolic panel  . Sedimentation rate  . HM PAP SMEAR    No Follow-up on file.

## 2013-09-20 NOTE — Progress Notes (Signed)
Pre visit review using our clinic review tool, if applicable. No additional management support is needed unless otherwise documented below in the visit note. 

## 2013-09-20 NOTE — Patient Instructions (Addendum)
I will review your labs today and contact you with results  Prednisone taper for the joint pain and sinus pressure   Trial of tizanidine instead of robaxin for muscle spasm

## 2013-09-21 LAB — RHEUMATOID FACTOR: Rhuematoid fact SerPl-aCnc: 10 IU/mL (ref <=14)

## 2013-09-21 LAB — ANA W/REFLEX IF POSITIVE: Anti Nuclear Antibody(ANA): NEGATIVE

## 2013-09-23 ENCOUNTER — Encounter: Payer: Self-pay | Admitting: Internal Medicine

## 2013-09-23 DIAGNOSIS — M26609 Unspecified temporomandibular joint disorder, unspecified side: Secondary | ICD-10-CM | POA: Insufficient documentation

## 2013-09-23 NOTE — Assessment & Plan Note (Signed)
Thyroid function is WNL on current dose.  No current changes needed.  

## 2013-09-23 NOTE — Assessment & Plan Note (Signed)
Continue diclofenac and try alternative MR.  Tizanidine prescribed

## 2013-09-23 NOTE — Assessment & Plan Note (Addendum)
Repeat workup for autoimmune etiologies was notable for normal CRP, RA factor and ESR, and negative ANA.  She has had multiple joint replacements (knees, shoulders) back surgeries,  s well as carpal tunnel release left wrist Sept 2014 and chronic pain managed by [facility] Clinic.

## 2013-09-23 NOTE — Assessment & Plan Note (Addendum)
She has no signs of bacterial infection.  Given failure to improve with prior therapies,  will treat with prednisone taper and reassess

## 2013-09-23 NOTE — Assessment & Plan Note (Addendum)
She has not had a CBC since August 2013 due to infrequent follow up but hgb checked < 30 months ago was normal at 13.5 .  Will add CBC and iron studies to next blood draw.

## 2013-09-25 LAB — HLA-B27 ANTIGEN: DNA Result:: NOT DETECTED

## 2013-10-02 DIAGNOSIS — M76899 Other specified enthesopathies of unspecified lower limb, excluding foot: Secondary | ICD-10-CM | POA: Diagnosis not present

## 2013-10-02 DIAGNOSIS — Z96659 Presence of unspecified artificial knee joint: Secondary | ICD-10-CM | POA: Diagnosis not present

## 2013-10-30 ENCOUNTER — Ambulatory Visit (INDEPENDENT_AMBULATORY_CARE_PROVIDER_SITE_OTHER): Payer: Medicare Other | Admitting: Adult Health

## 2013-10-30 ENCOUNTER — Encounter: Payer: Self-pay | Admitting: Adult Health

## 2013-10-30 VITALS — BP 104/58 | HR 68 | Temp 98.3°F | Resp 12 | Wt 138.0 lb

## 2013-10-30 DIAGNOSIS — J329 Chronic sinusitis, unspecified: Secondary | ICD-10-CM

## 2013-10-30 MED ORDER — DOXYCYCLINE HYCLATE 100 MG PO TABS: 100.0000 mg | ORAL_TABLET | Freq: Two times a day (BID) | ORAL | Status: DC

## 2013-10-30 NOTE — Assessment & Plan Note (Signed)
Seen in clinic on 09/20/13. Given medrol dose pack. Failure to improve with prednisone or other treatments, start doxycycline bid x 10 days. If no improvement will need reevaluation and/or referral to ENT.

## 2013-10-30 NOTE — Patient Instructions (Signed)
  Start doxycycline twice a day for 10 days.  Continue to irrigate sinuses with simple saline.  Recommend taking a probiotic while on antibiotic such as Culturelle or Align  If symptoms not improved by completion of antibiotic we will need to see you for reevaluation and/or referral to ENT.

## 2013-10-30 NOTE — Progress Notes (Signed)
Pre visit review using our clinic review tool, if applicable. No additional management support is needed unless otherwise documented below in the visit note. 

## 2013-10-30 NOTE — Progress Notes (Signed)
   Subjective:    Patient ID: Sonya Cordova, female    DOB: [birth year], 68 y.o.   MRN: [id number]  HPI  Pt presents with post nasal drip, drainage is green globs, ear pressure, facial pain as well as swollen and painful neck glands. She reports taking so much decongestant that she is constipated. She reports that she finished the medrol dose pack but feels her symptoms are worse, not better.  Current Outpatient Prescriptions on File Prior to Visit  Medication Sig Dispense Refill  . diclofenac (VOLTAREN) 75 MG EC tablet Take 75 mg by mouth Twice daily.      Marland Kitchen estradiol (ESTRACE) 1 MG tablet Take 1 tablet (1 mg total) by mouth daily.  90 tablet  4  . Eszopiclone (ESZOPICLONE) 3 MG TABS Take 3 mg by mouth at bedtime. Take immediately before bedtime. Name Brand ONLY      . gabapentin (NEURONTIN) 100 MG capsule Take 300 mg by mouth 3 (three) times daily.      Marland Kitchen levothyroxine (SYNTHROID, LEVOTHROID) 125 MCG tablet Take 1 tablet (125 mcg total) by mouth daily.  30 tablet  5  . methocarbamol (ROBAXIN) 500 MG tablet Take 500 mg by mouth Every 6 hours.       Marland Kitchen morphine (MSIR) 30 MG tablet Take 60 mg by mouth every 6 (six) hours as needed (takes 3xdaily). pain      . polyethylene glycol (MIRALAX / GLYCOLAX) packet Take 17 g by mouth as needed. constipation      . potassium chloride (K-DUR,KLOR-CON) 10 MEQ tablet Take 10 mEq by mouth 2 (two) times daily. With torsemide usage      . tiZANidine (ZANAFLEX) 4 MG tablet Take 1 tablet (4 mg total) by mouth every 6 (six) hours as needed for muscle spasms.  90 tablet  2  . torsemide (DEMADEX) 10 MG tablet Take 5 mg by mouth as needed. 5-10 mg  Prn  po       No current facility-administered medications on file prior to visit.     Review of Systems  Constitutional: Negative for fever and chills.  HENT: Positive for congestion, postnasal drip and sinus pressure.        Ear pressure, green drainage  Respiratory: Negative for cough.        Objective:   Physical Exam  Constitutional: She is oriented to person, place, and time. She appears well-developed and well-nourished. No distress.  HENT:  Right Ear: External ear normal.  Left ear canal very narrow and hard to visualize TM. Painful sinuses with palpation  Cardiovascular: Normal rate and regular rhythm.   Pulmonary/Chest: Effort normal and breath sounds normal. No respiratory distress. She has no wheezes. She has no rales.  Musculoskeletal: Normal range of motion.  Lymphadenopathy:    She has cervical adenopathy.  Neurological: She is alert and oriented to person, place, and time.  Skin: Skin is warm and dry.  Psychiatric: She has a normal mood and affect. Her behavior is normal. Judgment and thought content normal.  Appears frustrated with her ongoing symptoms.          Assessment & Plan:

## 2013-11-06 DIAGNOSIS — G894 Chronic pain syndrome: Secondary | ICD-10-CM | POA: Diagnosis not present

## 2013-11-06 DIAGNOSIS — M47812 Spondylosis without myelopathy or radiculopathy, cervical region: Secondary | ICD-10-CM | POA: Diagnosis not present

## 2013-11-06 DIAGNOSIS — M4120 Other idiopathic scoliosis, site unspecified: Secondary | ICD-10-CM | POA: Diagnosis not present

## 2013-11-06 DIAGNOSIS — M47817 Spondylosis without myelopathy or radiculopathy, lumbosacral region: Secondary | ICD-10-CM | POA: Diagnosis not present

## 2013-11-07 DIAGNOSIS — H43399 Other vitreous opacities, unspecified eye: Secondary | ICD-10-CM | POA: Diagnosis not present

## 2013-11-07 DIAGNOSIS — M545 Low back pain, unspecified: Secondary | ICD-10-CM | POA: Diagnosis not present

## 2013-11-07 DIAGNOSIS — M4120 Other idiopathic scoliosis, site unspecified: Secondary | ICD-10-CM | POA: Diagnosis not present

## 2013-11-21 ENCOUNTER — Telehealth: Payer: Self-pay | Admitting: Internal Medicine

## 2013-11-21 NOTE — Telephone Encounter (Signed)
Pt called wanting to know where she had her last colonscopy done at.

## 2013-11-23 NOTE — Telephone Encounter (Signed)
Unable to find colonoscopy notes in pt's chart. Pt notified.

## 2013-12-03 DIAGNOSIS — M545 Low back pain, unspecified: Secondary | ICD-10-CM | POA: Diagnosis not present

## 2013-12-03 DIAGNOSIS — M963 Postlaminectomy kyphosis: Secondary | ICD-10-CM | POA: Diagnosis not present

## 2013-12-03 DIAGNOSIS — M4120 Other idiopathic scoliosis, site unspecified: Secondary | ICD-10-CM | POA: Diagnosis not present

## 2013-12-03 DIAGNOSIS — M79609 Pain in unspecified limb: Secondary | ICD-10-CM | POA: Diagnosis not present

## 2013-12-05 DIAGNOSIS — E039 Hypothyroidism, unspecified: Secondary | ICD-10-CM | POA: Diagnosis not present

## 2013-12-05 DIAGNOSIS — Z886 Allergy status to analgesic agent status: Secondary | ICD-10-CM | POA: Diagnosis not present

## 2013-12-05 DIAGNOSIS — M4120 Other idiopathic scoliosis, site unspecified: Secondary | ICD-10-CM | POA: Diagnosis not present

## 2013-12-05 DIAGNOSIS — I10 Essential (primary) hypertension: Secondary | ICD-10-CM | POA: Diagnosis not present

## 2013-12-05 DIAGNOSIS — Z79899 Other long term (current) drug therapy: Secondary | ICD-10-CM | POA: Diagnosis not present

## 2013-12-05 DIAGNOSIS — Z87891 Personal history of nicotine dependence: Secondary | ICD-10-CM | POA: Diagnosis not present

## 2013-12-05 DIAGNOSIS — M961 Postlaminectomy syndrome, not elsewhere classified: Secondary | ICD-10-CM | POA: Diagnosis not present

## 2013-12-05 DIAGNOSIS — G894 Chronic pain syndrome: Secondary | ICD-10-CM | POA: Diagnosis not present

## 2013-12-11 DIAGNOSIS — G894 Chronic pain syndrome: Secondary | ICD-10-CM | POA: Diagnosis not present

## 2013-12-11 DIAGNOSIS — I10 Essential (primary) hypertension: Secondary | ICD-10-CM | POA: Diagnosis not present

## 2013-12-11 DIAGNOSIS — M961 Postlaminectomy syndrome, not elsewhere classified: Secondary | ICD-10-CM | POA: Diagnosis not present

## 2013-12-11 DIAGNOSIS — M4120 Other idiopathic scoliosis, site unspecified: Secondary | ICD-10-CM | POA: Diagnosis not present

## 2013-12-11 DIAGNOSIS — T85695A Other mechanical complication of other nervous system device, implant or graft, initial encounter: Secondary | ICD-10-CM | POA: Diagnosis not present

## 2013-12-11 DIAGNOSIS — Z79899 Other long term (current) drug therapy: Secondary | ICD-10-CM | POA: Diagnosis not present

## 2013-12-11 DIAGNOSIS — E039 Hypothyroidism, unspecified: Secondary | ICD-10-CM | POA: Diagnosis not present

## 2013-12-18 ENCOUNTER — Encounter: Payer: Self-pay | Admitting: *Deleted

## 2013-12-18 ENCOUNTER — Ambulatory Visit (INDEPENDENT_AMBULATORY_CARE_PROVIDER_SITE_OTHER): Payer: Medicare Other | Admitting: Podiatry

## 2013-12-18 ENCOUNTER — Encounter: Payer: Self-pay | Admitting: Podiatry

## 2013-12-18 VITALS — BP 104/60 | HR 64 | Resp 16

## 2013-12-18 DIAGNOSIS — L600 Ingrowing nail: Secondary | ICD-10-CM

## 2013-12-18 DIAGNOSIS — M898X9 Other specified disorders of bone, unspecified site: Secondary | ICD-10-CM

## 2013-12-18 DIAGNOSIS — M216X9 Other acquired deformities of unspecified foot: Secondary | ICD-10-CM

## 2013-12-18 DIAGNOSIS — M2040 Other hammer toe(s) (acquired), unspecified foot: Secondary | ICD-10-CM | POA: Diagnosis not present

## 2013-12-18 NOTE — Progress Notes (Signed)
Discuss surgery again for the left foot. I think now is the best time to get it done him still having pain in the needs something to correct this foot.  Objective: Vital signs are stable she is alert and oriented x3. Pulses are strongly palpable bilateral foot. She has hammertoe deformities #2 and #3 of the left foot. With painful ingrown nails to the tibial and fibular border of the hallux left. She has plantar flexed elongated second and third metatarsals of the left foot. Radiographs confirm this.  Assessment: Contracted metatarsophalangeal joints #2 and #3 of the left foot. With ingrown nails tibia and fibular borders of the hallux left.  Plan: We discussed the etiology pathology conservative versus surgical therapies we reviewed our consent form today in great detail. The consent consist of a second and third metatarsal osteotomy and hammertoe repair #2 and #3 of the left foot and matrixectomy is to toe #1 tibial and fibular border. I answered all the questions regarding these procedures the best of my ability in layman's terms she understood it was amenable to it and signed Dr. pages of the consent form she will followup with me in surgery within the next few weeks.

## 2013-12-18 NOTE — Patient Instructions (Signed)
Pre-Operative Instructions  Congratulations, you have decided to take an important step to improving your quality of life.  You can be assured that the doctors of [facility] will be with you every step of the way.  1. Plan to be at the surgery center/hospital at least 1 (one) hour prior to your scheduled time unless otherwise directed by the surgical center/hospital staff.  You must have a responsible adult accompany you, remain during the surgery and drive you home.  Make sure you have directions to the surgical center/hospital and know how to get there on time. 2. For hospital based surgery you will need to obtain a history and physical form from your family physician within 1 month prior to the date of surgery- we will give you a form for you primary physician.  3. We make every effort to accommodate the date you request for surgery.  There are however, times where surgery dates or times have to be moved.  We will contact you as soon as possible if a change in schedule is required.   4. No Aspirin/Ibuprofen for one week before surgery.  If you are on aspirin, any non-steroidal anti-inflammatory medications (Mobic, Aleve, Ibuprofen) you should stop taking it 7 days prior to your surgery.  You make take Tylenol  For pain prior to surgery.  5. Medications- If you are taking daily heart and blood pressure medications, seizure, reflux, allergy, asthma, anxiety, pain or diabetes medications, make sure the surgery center/hospital is aware before the day of surgery so they may notify you which medications to take or avoid the day of surgery. 6. No food or drink after midnight the night before surgery unless directed otherwise by surgical center/hospital staff. 7. No alcoholic beverages 24 hours prior to surgery.  No smoking 24 hours prior to or 24 hours after surgery. 8. Wear loose pants or shorts- loose enough to fit over bandages, boots, and casts. 9. No slip on shoes, sneakers are best. 10. Bring  your boot with you to the surgery center/hospital.  Also bring crutches or a walker if your physician has prescribed it for you.  If you do not have this equipment, it will be provided for you after surgery. 11. If you have not been contracted by the surgery center/hospital by the day before your surgery, call to confirm the date and time of your surgery. 12. Leave-time from work may vary depending on the type of surgery you have.  Appropriate arrangements should be made prior to surgery with your employer. 13. Prescriptions will be provided immediately following surgery by your doctor.  Have these filled as soon as possible after surgery and take the medication as directed. 14. Remove nail polish on the operative foot. 15. Wash the night before surgery.  The night before surgery wash the foot and leg well with the antibacterial soap provided and water paying special attention to beneath the toenails and in between the toes.  Rinse thoroughly with water and dry well with a towel.  Perform this wash unless told not to do so by your physician.  Enclosed: 1 Ice pack (please put in freezer the night before surgery)   1 Hibiclens skin cleaner   Pre-op Instructions  If you have any questions regarding the instructions, do not hesitate to call our office.  [place]: [address] [place], [place] [address] [phone]  [place]: [address] [place], [place] [address] [phone]  [place]: [address]  [place], [place] [address] [phone]  Dr. Richard   Tuchman DPM, Dr. Norman Regal DPM Dr. Richard Sikora DPM, Dr. M. Todd Hyatt DPM, Dr. Kathryn Egerton DPM 

## 2014-01-01 DIAGNOSIS — M7750 Other enthesopathy of unspecified foot: Secondary | ICD-10-CM | POA: Diagnosis not present

## 2014-01-01 DIAGNOSIS — M47817 Spondylosis without myelopathy or radiculopathy, lumbosacral region: Secondary | ICD-10-CM | POA: Diagnosis not present

## 2014-01-01 DIAGNOSIS — G894 Chronic pain syndrome: Secondary | ICD-10-CM | POA: Diagnosis not present

## 2014-01-01 DIAGNOSIS — Z79899 Other long term (current) drug therapy: Secondary | ICD-10-CM | POA: Diagnosis not present

## 2014-01-10 ENCOUNTER — Other Ambulatory Visit: Payer: Self-pay | Admitting: Podiatry

## 2014-01-10 MED ORDER — CEPHALEXIN 500 MG PO CAPS: 500.0000 mg | ORAL_CAPSULE | Freq: Three times a day (TID) | ORAL | Status: DC

## 2014-01-10 MED ORDER — PROMETHAZINE HCL 12.5 MG PO TABS: 25.0000 mg | ORAL_TABLET | Freq: Four times a day (QID) | ORAL | Status: DC | PRN

## 2014-01-10 MED ORDER — OXYCODONE-ACETAMINOPHEN 10-325 MG PO TABS: ORAL_TABLET | ORAL | Status: DC

## 2014-01-11 DIAGNOSIS — M21549 Acquired clubfoot, unspecified foot: Secondary | ICD-10-CM

## 2014-01-11 DIAGNOSIS — M216X9 Other acquired deformities of unspecified foot: Secondary | ICD-10-CM | POA: Diagnosis not present

## 2014-01-11 DIAGNOSIS — M25579 Pain in unspecified ankle and joints of unspecified foot: Secondary | ICD-10-CM | POA: Diagnosis not present

## 2014-01-11 DIAGNOSIS — E7800 Pure hypercholesterolemia, unspecified: Secondary | ICD-10-CM | POA: Diagnosis not present

## 2014-01-11 DIAGNOSIS — M2040 Other hammer toe(s) (acquired), unspecified foot: Secondary | ICD-10-CM

## 2014-01-11 DIAGNOSIS — M898X9 Other specified disorders of bone, unspecified site: Secondary | ICD-10-CM

## 2014-01-11 DIAGNOSIS — L600 Ingrowing nail: Secondary | ICD-10-CM

## 2014-01-15 DIAGNOSIS — M79609 Pain in unspecified limb: Secondary | ICD-10-CM | POA: Diagnosis not present

## 2014-01-15 DIAGNOSIS — T85695A Other mechanical complication of other nervous system device, implant or graft, initial encounter: Secondary | ICD-10-CM | POA: Diagnosis not present

## 2014-01-15 DIAGNOSIS — M545 Low back pain, unspecified: Secondary | ICD-10-CM | POA: Diagnosis not present

## 2014-01-15 DIAGNOSIS — M961 Postlaminectomy syndrome, not elsewhere classified: Secondary | ICD-10-CM | POA: Diagnosis not present

## 2014-01-16 ENCOUNTER — Other Ambulatory Visit: Payer: Self-pay | Admitting: Dermatology

## 2014-01-16 DIAGNOSIS — L719 Rosacea, unspecified: Secondary | ICD-10-CM | POA: Diagnosis not present

## 2014-01-16 DIAGNOSIS — D485 Neoplasm of uncertain behavior of skin: Secondary | ICD-10-CM | POA: Diagnosis not present

## 2014-01-17 ENCOUNTER — Encounter: Payer: Self-pay | Admitting: Podiatry

## 2014-01-17 ENCOUNTER — Ambulatory Visit (INDEPENDENT_AMBULATORY_CARE_PROVIDER_SITE_OTHER): Payer: Medicare Other

## 2014-01-17 ENCOUNTER — Ambulatory Visit (INDEPENDENT_AMBULATORY_CARE_PROVIDER_SITE_OTHER): Payer: Medicare Other | Admitting: Podiatry

## 2014-01-17 VITALS — BP 110/64 | HR 61 | Temp 97.7°F | Resp 18

## 2014-01-17 DIAGNOSIS — Z9889 Other specified postprocedural states: Secondary | ICD-10-CM | POA: Diagnosis not present

## 2014-01-17 NOTE — Progress Notes (Signed)
She presents today one week status post surgical matrixectomy hallux left second third metatarsal osteotomies hammertoe repair #2 and #3 left foot. She states it is been terrible is very painful. She denies fever chills nausea vomiting muscle aches and pains other than foot.  Objective: Vital signs are stable she is alert and oriented x3 she presents today with her Cam Walker intact dry sterile dressing was intact once removed demonstrates erythema and edema to the surgical foot left no more than what would be expected however noted [place] any signs of infection. Radiographically evaluation demonstrates confirmed good placement of capital osteotomy and hammertoe repairs.  Assessment: Well-healing surgical foot status post 1 week.  Plan: Redressed today with a dressed a compressive dressing I will followup with her in one week for removing some of her sutures.

## 2014-01-24 ENCOUNTER — Ambulatory Visit: Payer: Medicare Other | Admitting: Podiatry

## 2014-01-24 ENCOUNTER — Encounter: Payer: Self-pay | Admitting: Podiatry

## 2014-01-24 VITALS — BP 101/67 | HR 62 | Temp 98.3°F | Resp 16

## 2014-01-24 DIAGNOSIS — Z9889 Other specified postprocedural states: Secondary | ICD-10-CM

## 2014-01-25 NOTE — Progress Notes (Signed)
She presents today 2 weeks status post second and third metatarsal osteotomies with hammertoe repairs and screws to toes noted in toes #3 of the left foot. She also had a surgical matrixectomy performed.  Objective: Vital signs are stable she is alert and oriented x3. Dry sterile dressing intact once removed demonstrates toes and extensors of with contraction of the metatarsal phalangeal joints dorsally. Sutures are intact margins appear to be well coapted I see no signs of infection. We removed all the sutures today margins remain well coapted. She has good range of motion of the toes however they her in contracture dorsally more than likely due to scar. I am very gently able to get into a neutral position with stretching.  Assessment: Well-healing surgical foot second and third metatarsal osteotomies with hammertoe repair and matrixectomy.  Plan: Discussed etiology pathology conservative versus surgical therapies at this point I did suggest that we put her in a Darco digital splint and I also suggested that she wrap the toes daily. I also suggested that she physically manipulate the toes plantarly. I placed her in a Darco shoe instead of the Cam Walker and I will followup with her in 2 weeks

## 2014-01-29 ENCOUNTER — Telehealth: Payer: Self-pay | Admitting: *Deleted

## 2014-01-29 DIAGNOSIS — M961 Postlaminectomy syndrome, not elsewhere classified: Secondary | ICD-10-CM | POA: Diagnosis not present

## 2014-01-29 DIAGNOSIS — G894 Chronic pain syndrome: Secondary | ICD-10-CM | POA: Diagnosis not present

## 2014-01-29 DIAGNOSIS — M47817 Spondylosis without myelopathy or radiculopathy, lumbosacral region: Secondary | ICD-10-CM | POA: Diagnosis not present

## 2014-01-29 MED ORDER — OXYCODONE-ACETAMINOPHEN 10-325 MG PO TABS: ORAL_TABLET | ORAL | Status: DC

## 2014-01-29 NOTE — Telephone Encounter (Signed)
I had surgery 2 1/2 weeks ago.  I need a refill of Hydrocodone.  Been trying to point toes downward like he said.  I know I need to pick them up.  I called and informed the patient that Dr. Marta Antu the refill.  She said she would come by first thing in the morning to pick it up because they live in [place].

## 2014-01-31 ENCOUNTER — Ambulatory Visit (INDEPENDENT_AMBULATORY_CARE_PROVIDER_SITE_OTHER): Payer: Medicare Other | Admitting: Podiatry

## 2014-01-31 VITALS — BP 104/60 | HR 64 | Resp 16

## 2014-01-31 DIAGNOSIS — M2040 Other hammer toe(s) (acquired), unspecified foot: Secondary | ICD-10-CM

## 2014-01-31 NOTE — Progress Notes (Signed)
Explained to patient that it can take some time for the toes to come down using the splint that it has only been one week.  She is doing fine going to ask dr Paulla Dolly to speak with her. Dr Paulla Dolly had me rewrap her toes and really bring them down. i brought the toes down as far as the patient can tolerate it, she is going to start using the darco splint at night as well.

## 2014-02-05 ENCOUNTER — Encounter: Payer: Medicare Other | Admitting: Gynecology

## 2014-02-07 ENCOUNTER — Encounter: Payer: Self-pay | Admitting: Podiatry

## 2014-02-07 ENCOUNTER — Ambulatory Visit (INDEPENDENT_AMBULATORY_CARE_PROVIDER_SITE_OTHER): Payer: Medicare Other

## 2014-02-07 ENCOUNTER — Ambulatory Visit (INDEPENDENT_AMBULATORY_CARE_PROVIDER_SITE_OTHER): Payer: Medicare Other | Admitting: Podiatry

## 2014-02-07 VITALS — BP 96/59 | HR 72 | Resp 16

## 2014-02-07 DIAGNOSIS — Z9889 Other specified postprocedural states: Secondary | ICD-10-CM

## 2014-02-07 DIAGNOSIS — M2040 Other hammer toe(s) (acquired), unspecified foot: Secondary | ICD-10-CM | POA: Diagnosis not present

## 2014-02-08 NOTE — Progress Notes (Signed)
Sonya Cordova presents today status post second met osteotomy and third metatarsal osteotomy as well as hammertoe repair #2 and #3 all of screws. She's a little upset because  her toes still states that she says.  Objective: Vital signs are stable she is alert and oriented x3. Much decrease in edema and erythema since previous visit. Toes #2 and #3. To be coming down nicely they are still elevated and they're still somewhat painful to the plantar aspect of the left foot. I think physical therapy will be benefit to her.  Assessment: Well-healing surgical toes mild contracture deformity of the scar tissue second and third metatarsophalangeal joints.  Plan: We will send her to physical therapy to see if we can break up some scar tissue and get the toes more and a rectus position. I will followup with her in one month. We did discuss the possible need for releasing the scar tissue and pinning the toes.

## 2014-02-18 DIAGNOSIS — M19019 Primary osteoarthritis, unspecified shoulder: Secondary | ICD-10-CM | POA: Diagnosis not present

## 2014-02-21 DIAGNOSIS — M19019 Primary osteoarthritis, unspecified shoulder: Secondary | ICD-10-CM | POA: Diagnosis not present

## 2014-02-26 DIAGNOSIS — C44319 Basal cell carcinoma of skin of other parts of face: Secondary | ICD-10-CM | POA: Diagnosis not present

## 2014-02-26 DIAGNOSIS — L570 Actinic keratosis: Secondary | ICD-10-CM | POA: Diagnosis not present

## 2014-02-28 ENCOUNTER — Ambulatory Visit (INDEPENDENT_AMBULATORY_CARE_PROVIDER_SITE_OTHER): Payer: Medicare Other | Admitting: Gynecology

## 2014-02-28 ENCOUNTER — Encounter: Payer: Self-pay | Admitting: Gynecology

## 2014-02-28 VITALS — BP 106/62 | Ht 67.0 in | Wt 142.0 lb

## 2014-02-28 DIAGNOSIS — Z79890 Hormone replacement therapy (postmenopausal): Secondary | ICD-10-CM

## 2014-02-28 DIAGNOSIS — N952 Postmenopausal atrophic vaginitis: Secondary | ICD-10-CM | POA: Diagnosis not present

## 2014-02-28 DIAGNOSIS — M19019 Primary osteoarthritis, unspecified shoulder: Secondary | ICD-10-CM | POA: Diagnosis not present

## 2014-02-28 DIAGNOSIS — N3941 Urge incontinence: Secondary | ICD-10-CM

## 2014-02-28 MED ORDER — DARIFENACIN HYDROBROMIDE ER 7.5 MG PO TB24: 7.5000 mg | ORAL_TABLET | Freq: Every day | ORAL | Status: DC

## 2014-02-28 MED ORDER — ESTRADIOL 1 MG PO TABS: 1.0000 mg | ORAL_TABLET | Freq: Every day | ORAL | Status: DC

## 2014-02-28 NOTE — Patient Instructions (Signed)
Start on the Enablex medication for your bladder. Call me if you have any issues for this. Call me regardless in one month to see how you're doing. Start on half of your estrogen dose and see how you do. If you tolerate this and stay on that dose. Followup in one year for annual exam.  You may obtain a copy of any labs that were done today by logging onto MyChart as outlined in the instructions provided with your AVS (after visit summary). The office will not call with normal lab results but certainly if there are any significant abnormalities then we will contact you.   Health Maintenance, Female A healthy lifestyle and preventative care can promote health and wellness.  Maintain regular health, dental, and eye exams.  Eat a healthy diet. Foods like vegetables, fruits, whole grains, low-fat dairy products, and lean protein foods contain the nutrients you need without too many calories. Decrease your intake of foods high in solid fats, added sugars, and salt. Get information about a proper diet from your caregiver, if necessary.  Regular physical exercise is one of the most important things you can do for your health. Most adults should get at least 150 minutes of moderate-intensity exercise (any activity that increases your heart rate and causes you to sweat) each week. In addition, most adults need muscle-strengthening exercises on 2 or more days a week.   Maintain a healthy weight. The body mass index (BMI) is a screening tool to identify possible weight problems. It provides an estimate of body fat based on height and weight. Your caregiver can help determine your BMI, and can help you achieve or maintain a healthy weight. For adults 20 years and older:  A BMI below 18.5 is considered underweight.  A BMI of 18.5 to 24.9 is normal.  A BMI of 25 to 29.9 is considered overweight.  A BMI of 30 and above is considered obese.  Maintain normal blood lipids and cholesterol by exercising and  minimizing your intake of saturated fat. Eat a balanced diet with plenty of fruits and vegetables. Blood tests for lipids and cholesterol should begin at age 13 and be repeated every 5 years. If your lipid or cholesterol levels are high, you are over 50, or you are a high risk for heart disease, you may need your cholesterol levels checked more frequently.Ongoing high lipid and cholesterol levels should be treated with medicines if diet and exercise are not effective.  If you smoke, find out from your caregiver how to quit. If you do not use tobacco, do not start.  Lung cancer screening is recommended for adults aged 45 80 years who are at high risk for developing lung cancer because of a history of smoking. Yearly low-dose computed tomography (CT) is recommended for people who have at least a 30-pack-year history of smoking and are a current smoker or have quit within the past 15 years. A pack year of smoking is smoking an average of 1 pack of cigarettes a day for 1 year (for example: 1 pack a day for 30 years or 2 packs a day for 15 years). Yearly screening should continue until the smoker has stopped smoking for at least 15 years. Yearly screening should also be stopped for people who develop a health problem that would prevent them from having lung cancer treatment.  If you are pregnant, do not drink alcohol. If you are breastfeeding, be very cautious about drinking alcohol. If you are not pregnant and choose to  drink alcohol, do not exceed 1 drink per day. One drink is considered to be 12 ounces (355 mL) of beer, 5 ounces (148 mL) of wine, or 1.5 ounces (44 mL) of liquor.  Avoid use of street drugs. Do not share needles with anyone. Ask for help if you need support or instructions about stopping the use of drugs.  High blood pressure causes heart disease and increases the risk of stroke. Blood pressure should be checked at least every 1 to 2 years. Ongoing high blood pressure should be treated with  medicines, if weight loss and exercise are not effective.  If you are 43 to 66 years old, ask your caregiver if you should take aspirin to prevent strokes.  Diabetes screening involves taking a blood sample to check your fasting blood sugar level. This should be done once every 3 years, after age 85, if you are within normal weight and without risk factors for diabetes. Testing should be considered at a younger age or be carried out more frequently if you are overweight and have at least 1 risk factor for diabetes.  Breast cancer screening is essential preventative care for women. You should practice "breast self-awareness." This means understanding the normal appearance and feel of your breasts and may include breast self-examination. Any changes detected, no matter how small, should be reported to a caregiver. Women in their 11s and 30s should have a clinical breast exam (CBE) by a caregiver as part of a regular health exam every 1 to 3 years. After age 18, women should have a CBE every year. Starting at age 73, women should consider having a mammogram (breast X-ray) every year. Women who have a family history of breast cancer should talk to their caregiver about genetic screening. Women at a high risk of breast cancer should talk to their caregiver about having an MRI and a mammogram every year.  Breast cancer gene (BRCA)-related cancer risk assessment is recommended for women who have family members with BRCA-related cancers. BRCA-related cancers include breast, ovarian, tubal, and peritoneal cancers. Having family members with these cancers may be associated with an increased risk for harmful changes (mutations) in the breast cancer genes BRCA1 and BRCA2. Results of the assessment will determine the need for genetic counseling and BRCA1 and BRCA2 testing.  The Pap test is a screening test for cervical cancer. Women should have a Pap test starting at age 42. Between ages 109 and 29, Pap tests should be  repeated every 2 years. Beginning at age 57, you should have a Pap test every 3 years as long as the past 3 Pap tests have been normal. If you had a hysterectomy for a problem that was not cancer or a condition that could lead to cancer, then you no longer need Pap tests. If you are between ages 33 and 51, and you have had normal Pap tests going back 10 years, you no longer need Pap tests. If you have had past treatment for cervical cancer or a condition that could lead to cancer, you need Pap tests and screening for cancer for at least 20 years after your treatment. If Pap tests have been discontinued, risk factors (such as a new sexual partner) need to be reassessed to determine if screening should be resumed. Some women have medical problems that increase the chance of getting cervical cancer. In these cases, your caregiver may recommend more frequent screening and Pap tests.  The human papillomavirus (HPV) test is an additional test that may  be used for cervical cancer screening. The HPV test looks for the virus that can cause the cell changes on the cervix. The cells collected during the Pap test can be tested for HPV. The HPV test could be used to screen women aged 13 years and older, and should be used in women of any age who have unclear Pap test results. After the age of 24, women should have HPV testing at the same frequency as a Pap test.  Colorectal cancer can be detected and often prevented. Most routine colorectal cancer screening begins at the age of 2 and continues through age 31. However, your caregiver may recommend screening at an earlier age if you have risk factors for colon cancer. On a yearly basis, your caregiver may provide home test kits to check for hidden blood in the stool. Use of a small camera at the end of a tube, to directly examine the colon (sigmoidoscopy or colonoscopy), can detect the earliest forms of colorectal cancer. Talk to your caregiver about this at age 50, when  routine screening begins. Direct examination of the colon should be repeated every 5 to 10 years through age 81, unless early forms of pre-cancerous polyps or small growths are found.  Hepatitis C blood testing is recommended for all people born from [birth year] through 1965 and any individual with known risks for hepatitis C.  Practice safe sex. Use condoms and avoid high-risk sexual practices to reduce the spread of sexually transmitted infections (STIs). Sexually active women aged 40 and younger should be checked for Chlamydia, which is a common sexually transmitted infection. Older women with new or multiple partners should also be tested for Chlamydia. Testing for other STIs is recommended if you are sexually active and at increased risk.  Osteoporosis is a disease in which the bones lose minerals and strength with aging. This can result in serious bone fractures. The risk of osteoporosis can be identified using a bone density scan. Women ages 82 and over and women at risk for fractures or osteoporosis should discuss screening with their caregivers. Ask your caregiver whether you should be taking a calcium supplement or vitamin D to reduce the rate of osteoporosis.  Menopause can be associated with physical symptoms and risks. Hormone replacement therapy is available to decrease symptoms and risks. You should talk to your caregiver about whether hormone replacement therapy is right for you.  Use sunscreen. Apply sunscreen liberally and repeatedly throughout the day. You should seek shade when your shadow is shorter than you. Protect yourself by wearing long sleeves, pants, a wide-brimmed hat, and sunglasses year round, whenever you are outdoors.  Notify your caregiver of new moles or changes in moles, especially if there is a change in shape or color. Also notify your caregiver if a mole is larger than the size of a pencil eraser.  Stay current with your immunizations. Document Released: 04/05/2011  Document Revised: 01/15/2013 Document Reviewed: 04/05/2011 [facility] Patient Information 2014 [place].

## 2014-02-28 NOTE — Progress Notes (Addendum)
Sonya Cordova 1946/08/14 [id number]        68 y.o.  [id number] for followup exam. Several issues noted below.  Past medical history,surgical history, problem list, medications, allergies, family history and social history were all reviewed and documented as reviewed in the EPIC chart.  ROS:  12 system ROS performed with pertinent positives and negatives included in the history, assessment and plan.  Included Systems: General, HEENT, Neck, Cardiovascular, Pulmonary, Gastrointestinal, Genitourinary, Musculoskeletal, Dermatologic, Endocrine, Hematological, Neurologic, Psychiatric Additional significant findings :  Urgency incontinence   Exam: Kim assistant Filed Vitals:   02/28/14 1138  BP: 106/62  Height: 5\' 7"  (1.702 m)  Weight: 142 lb (64.411 kg)   General appearance:  Normal affect, orientation and appearance. Skin: Grossly normal HEENT: Without gross lesions.  No cervical or supraclavicular adenopathy. Thyroid normal.  Lungs:  Clear without wheezing, rales or rhonchi Cardiac: RR, without RMG Abdominal:  Soft, nontender, without masses, guarding, rebound, organomegaly or hernia Breasts:  Examined lying and sitting without masses, retractions, discharge or axillary adenopathy. Bilateral implants noted. Pelvic:  Ext/BUS/vagina with generalized atrophic changes  Adnexa  Without masses or tenderness    Anus and perineum  Normal   Rectovaginal  Normal sphincter tone without palpated masses or tenderness.    Assessment/Plan:  68 y.o. [name] female for followup exam.   1. HRT/menopausal symptoms. Patient continues on Estrace 1 mg daily.  I again reviewed the whole issue of HRT with her to include the WHI study with increased risk of stroke, heart attack, DVT and breast cancer. The ACOG and NAMS statements for lowest dose for the shortest period of time reviewed. Transdermal versus oral first-pass effect benefit discussed.  Patient strongly wants to continue. She has tried weaning before  with unacceptable hot flushes. I did suggest that she go to 0.5 mg daily and see how she does and if she does well then to continue on this and will readdress issue next year. I refilled her 1 mg x1 year. 2. Urgency incontinence. Patient's noting worsening urgency symptoms with occasional incontinence. No stress-type related symptoms such as loss of urine with coughing sneezing laughing. No dysuria frequency or other bladder symptoms. Check urinalysis today. Options for management reviewed. She tried Oxytrol OTC and had too much dry mouth and constipation. We will go ahead and to try Enablex 7.5 mg daily #30 with one refill. She will call me at the end of one month and see how she's doing. 3. Pap smear. I saw the patient last year and it's recorded " Pap smear done today". The chart shows a Pap smear listed from 5/21 but I see no results. Unclear if this is a dictation error and it was supposed to be " no Pap smear done today" and that this is the order that may have been placed incorrectly. Will try to track down to see if there is an actual Pap smear result. If not I discussed with the patient whether we should do one. She is status post hysterectomy for benign indications. She is over the age of 27. She has no history of abnormal Pap smears previously. We both agreed that if there had not been a Pap smear result from last year that we will forego doing and she is comfortable with this. Her last recorded Pap smear is 2012. 4. Osteopenia. DEXA 02/2012 with T score -1.1. No FRAX due to her ERT. We both discussed and we'll plan on repeating at a five-year interval. Increased calcium vitamin D reviewed.  5. Mammography coming due in August I reminded her to schedule this. SBE monthly review. 6. Colonoscopy 2012. Repeat at their recommended interval. 7. Health maintenance. No blood work done as this is done through her primary physician's office. Followup with phone call as far as Enablex otherwise one  year.  Addendum: Cytology reports having no Pap smear results on this patient from last year.  Note: This document was prepared with digital dictation and possible smart phrase technology. Any transcriptional errors that result from this process are unintentional.   Sonya Auerbach MD, 12:33 PM 02/28/2014

## 2014-03-01 ENCOUNTER — Telehealth: Payer: Self-pay | Admitting: *Deleted

## 2014-03-01 LAB — URINALYSIS W MICROSCOPIC + REFLEX CULTURE
Bacteria, UA: NONE SEEN
Bilirubin Urine: NEGATIVE
Casts: NONE SEEN
Crystals: NONE SEEN
Glucose, UA: NEGATIVE mg/dL
Hgb urine dipstick: NEGATIVE
Ketones, ur: NEGATIVE mg/dL
Leukocytes, UA: NEGATIVE
Nitrite: NEGATIVE
Protein, ur: NEGATIVE mg/dL
Specific Gravity, Urine: 1.021 (ref 1.005–1.030)
Squamous Epithelial / LPF: NONE SEEN
Urobilinogen, UA: 0.2 mg/dL (ref 0.0–1.0)
pH: 5.5 (ref 5.0–8.0)

## 2014-03-01 NOTE — Telephone Encounter (Signed)
Pt was prescribed Enablex 7.5 mg daily on OV 02/28/14 states medication is $167 too expensive. Pt asked if something else could be prescribed?

## 2014-03-01 NOTE — Telephone Encounter (Signed)
Check with the pharmacy to see the cost of VESIcare and Myrbetriq

## 2014-03-04 DIAGNOSIS — C44319 Basal cell carcinoma of skin of other parts of face: Secondary | ICD-10-CM | POA: Diagnosis not present

## 2014-03-05 ENCOUNTER — Telehealth: Payer: Self-pay

## 2014-03-05 MED ORDER — SOLIFENACIN SUCCINATE 5 MG PO TABS: 5.0000 mg | ORAL_TABLET | Freq: Every day | ORAL | Status: DC

## 2014-03-05 NOTE — Telephone Encounter (Signed)
Try VESIcare 5 mg #30 one by mouth daily

## 2014-03-05 NOTE — Telephone Encounter (Signed)
Pharmacy informed to notify patient.

## 2014-03-05 NOTE — Telephone Encounter (Signed)
rx sent, pt informed.  

## 2014-03-05 NOTE — Telephone Encounter (Signed)
Patient needs to contact her insurance company and get a list of approved medications for overactive bladder from which we can choose.

## 2014-03-05 NOTE — Telephone Encounter (Signed)
Spoke to pharmacist and he said that a Rx would be needed to get the cost for medication with insurance.

## 2014-03-05 NOTE — Telephone Encounter (Signed)
Pharmacy sent a fax "Insurance will not pay for the Vesicare or the Enablex and both are over $200.00. Maybe switch to something else?"

## 2014-03-07 DIAGNOSIS — M19019 Primary osteoarthritis, unspecified shoulder: Secondary | ICD-10-CM | POA: Diagnosis not present

## 2014-03-12 DIAGNOSIS — M19019 Primary osteoarthritis, unspecified shoulder: Secondary | ICD-10-CM | POA: Diagnosis not present

## 2014-03-14 ENCOUNTER — Encounter: Payer: Self-pay | Admitting: Podiatry

## 2014-03-14 ENCOUNTER — Ambulatory Visit (INDEPENDENT_AMBULATORY_CARE_PROVIDER_SITE_OTHER): Payer: Medicare Other | Admitting: Podiatry

## 2014-03-14 VITALS — BP 97/56 | HR 62 | Resp 15 | Ht 67.0 in | Wt 138.0 lb

## 2014-03-14 DIAGNOSIS — Z9889 Other specified postprocedural states: Secondary | ICD-10-CM

## 2014-03-14 NOTE — Progress Notes (Signed)
   Subjective:    Patient ID: Sonya Cordova, female    DOB: [birth year], 68 y.o.   MRN: [id number]  HPI Comments: Pt states she has not been able to perform PT at a regular basis due to scheduling, but wants to continue.     Review of Systems     Objective:   Physical Exam: Pulses are strongly palpable left foot she is status post second met osteotomies hammertoe repairs navicular #3 of the left foot. Initially the toes were severely dorsally contracted at the metatarsophalangeal joint however with physical therapy is starting to lay down quite nicely. She states that they are approximately 50% improved.        Assessment & Plan:  Assessment well-healing surgical foot slowly resolving postoperative complication associated with dorsiflexion of toes 2 and 3 left.  Plan: Continue physical therapy for another month and followup with me in 4-6 weeks

## 2014-03-15 DIAGNOSIS — M19019 Primary osteoarthritis, unspecified shoulder: Secondary | ICD-10-CM | POA: Diagnosis not present

## 2014-03-19 DIAGNOSIS — M19019 Primary osteoarthritis, unspecified shoulder: Secondary | ICD-10-CM | POA: Diagnosis not present

## 2014-03-21 DIAGNOSIS — M19019 Primary osteoarthritis, unspecified shoulder: Secondary | ICD-10-CM | POA: Diagnosis not present

## 2014-03-26 DIAGNOSIS — M961 Postlaminectomy syndrome, not elsewhere classified: Secondary | ICD-10-CM | POA: Diagnosis not present

## 2014-03-26 DIAGNOSIS — M19019 Primary osteoarthritis, unspecified shoulder: Secondary | ICD-10-CM | POA: Diagnosis not present

## 2014-03-26 DIAGNOSIS — G894 Chronic pain syndrome: Secondary | ICD-10-CM | POA: Diagnosis not present

## 2014-03-26 DIAGNOSIS — Z79899 Other long term (current) drug therapy: Secondary | ICD-10-CM | POA: Diagnosis not present

## 2014-03-28 DIAGNOSIS — G5620 Lesion of ulnar nerve, unspecified upper limb: Secondary | ICD-10-CM | POA: Diagnosis not present

## 2014-03-28 DIAGNOSIS — M19019 Primary osteoarthritis, unspecified shoulder: Secondary | ICD-10-CM | POA: Diagnosis not present

## 2014-03-28 DIAGNOSIS — M19049 Primary osteoarthritis, unspecified hand: Secondary | ICD-10-CM | POA: Diagnosis not present

## 2014-03-29 ENCOUNTER — Telehealth: Payer: Self-pay | Admitting: *Deleted

## 2014-03-29 NOTE — Telephone Encounter (Signed)
Orders for physical therapy was sent to [place].  Diagnosis is Post Operative left foot.  Evaluate and treat, continue treatment plan, 3 times a week for 4 weeks.

## 2014-04-02 DIAGNOSIS — M19019 Primary osteoarthritis, unspecified shoulder: Secondary | ICD-10-CM | POA: Diagnosis not present

## 2014-04-04 DIAGNOSIS — M19019 Primary osteoarthritis, unspecified shoulder: Secondary | ICD-10-CM | POA: Diagnosis not present

## 2014-04-09 DIAGNOSIS — M76899 Other specified enthesopathies of unspecified lower limb, excluding foot: Secondary | ICD-10-CM | POA: Diagnosis not present

## 2014-04-09 DIAGNOSIS — M19019 Primary osteoarthritis, unspecified shoulder: Secondary | ICD-10-CM | POA: Diagnosis not present

## 2014-04-11 ENCOUNTER — Ambulatory Visit (INDEPENDENT_AMBULATORY_CARE_PROVIDER_SITE_OTHER): Payer: Medicare Other

## 2014-04-11 ENCOUNTER — Encounter: Payer: Self-pay | Admitting: Podiatry

## 2014-04-11 ENCOUNTER — Ambulatory Visit (INDEPENDENT_AMBULATORY_CARE_PROVIDER_SITE_OTHER): Payer: Medicare Other | Admitting: Podiatry

## 2014-04-11 VITALS — BP 103/55 | HR 59 | Resp 16

## 2014-04-11 DIAGNOSIS — M2042 Other hammer toe(s) (acquired), left foot: Secondary | ICD-10-CM

## 2014-04-11 DIAGNOSIS — M2040 Other hammer toe(s) (acquired), unspecified foot: Secondary | ICD-10-CM

## 2014-04-11 DIAGNOSIS — M19019 Primary osteoarthritis, unspecified shoulder: Secondary | ICD-10-CM | POA: Diagnosis not present

## 2014-04-11 DIAGNOSIS — Z9889 Other specified postprocedural states: Secondary | ICD-10-CM

## 2014-04-11 NOTE — Progress Notes (Signed)
She presents today status post second and third metatarsal osteotomies with hammertoe repairs screws #2 and #3 of the left foot. She denies fever chills nausea vomiting muscle aches and pains. She states she continues physical therapy on a regular basis.  Objective: Signs are stable she is alert and oriented x3. She is beginning to stretch the second and third metatarsophalangeal joints and starting to break down some of the scar tissue that is present around does osteotomy site. This is allowing for the toes to sit more rectus with a better looking results.  Assessment: Slowly improving hammertoe repairs and metatarsophalangeal joint releases with osteotomies #2 and #3 of the left foot.  Plan: Discussed etiology pathology conservative versus surgical therapies. Due to her back in her knee she is unable to perform at home physical therapy. We are going to request a continuous of physical therapy to further loosen the second and third metatarsophalangeal joints from their adhesions. I will followup with her in a month to 6 weeks.

## 2014-04-16 DIAGNOSIS — M19019 Primary osteoarthritis, unspecified shoulder: Secondary | ICD-10-CM | POA: Diagnosis not present

## 2014-04-18 DIAGNOSIS — M19019 Primary osteoarthritis, unspecified shoulder: Secondary | ICD-10-CM | POA: Diagnosis not present

## 2014-04-24 DIAGNOSIS — M19019 Primary osteoarthritis, unspecified shoulder: Secondary | ICD-10-CM | POA: Diagnosis not present

## 2014-04-25 DIAGNOSIS — G5620 Lesion of ulnar nerve, unspecified upper limb: Secondary | ICD-10-CM | POA: Diagnosis not present

## 2014-04-30 DIAGNOSIS — M19019 Primary osteoarthritis, unspecified shoulder: Secondary | ICD-10-CM | POA: Diagnosis not present

## 2014-05-02 DIAGNOSIS — Z85828 Personal history of other malignant neoplasm of skin: Secondary | ICD-10-CM | POA: Diagnosis not present

## 2014-05-02 DIAGNOSIS — L821 Other seborrheic keratosis: Secondary | ICD-10-CM | POA: Diagnosis not present

## 2014-05-07 DIAGNOSIS — M19019 Primary osteoarthritis, unspecified shoulder: Secondary | ICD-10-CM | POA: Diagnosis not present

## 2014-05-08 DIAGNOSIS — M47817 Spondylosis without myelopathy or radiculopathy, lumbosacral region: Secondary | ICD-10-CM | POA: Diagnosis not present

## 2014-05-08 DIAGNOSIS — G894 Chronic pain syndrome: Secondary | ICD-10-CM | POA: Diagnosis not present

## 2014-05-08 DIAGNOSIS — G5620 Lesion of ulnar nerve, unspecified upper limb: Secondary | ICD-10-CM | POA: Diagnosis not present

## 2014-05-08 DIAGNOSIS — M961 Postlaminectomy syndrome, not elsewhere classified: Secondary | ICD-10-CM | POA: Diagnosis not present

## 2014-05-08 DIAGNOSIS — Z79899 Other long term (current) drug therapy: Secondary | ICD-10-CM | POA: Diagnosis not present

## 2014-05-09 ENCOUNTER — Encounter: Payer: Self-pay | Admitting: Podiatry

## 2014-05-09 ENCOUNTER — Ambulatory Visit (INDEPENDENT_AMBULATORY_CARE_PROVIDER_SITE_OTHER): Payer: Medicare Other

## 2014-05-09 ENCOUNTER — Ambulatory Visit (INDEPENDENT_AMBULATORY_CARE_PROVIDER_SITE_OTHER): Payer: Medicare Other | Admitting: Podiatry

## 2014-05-09 VITALS — BP 102/86 | HR 69 | Resp 12

## 2014-05-09 DIAGNOSIS — M898X9 Other specified disorders of bone, unspecified site: Secondary | ICD-10-CM | POA: Diagnosis not present

## 2014-05-09 DIAGNOSIS — Z9889 Other specified postprocedural states: Secondary | ICD-10-CM

## 2014-05-09 NOTE — Progress Notes (Signed)
Sonya Cordova presents today for followup of her hammertoe repairs #2 and #3 of the left foot. She has been being seen by physical therapy to help stretch scar tissue overlying the second and third digits of the left foot. This appears to be working very well for her in that she states that her toes are almost touching the floor.  Objective: Vital signs are stable she is alert and oriented x3 pulses are palpable bilateral. Her toes are much more flexible at the second and third metatarsophalangeal joints and on radiograph a perfectly parallel with the other toes.  Assessment: Well-healing hammertoe repair with screws. Contraction postsurgical toes 2 and 3 left foot.  Plan: Continue physical therapy and discussed options of removing the screws today. I will followup with her in a few months to evaluate this.

## 2014-05-15 ENCOUNTER — Other Ambulatory Visit: Payer: Self-pay

## 2014-05-15 DIAGNOSIS — Z1231 Encounter for screening mammogram for malignant neoplasm of breast: Secondary | ICD-10-CM

## 2014-05-15 DIAGNOSIS — M19019 Primary osteoarthritis, unspecified shoulder: Secondary | ICD-10-CM | POA: Diagnosis not present

## 2014-05-28 ENCOUNTER — Other Ambulatory Visit (INDEPENDENT_AMBULATORY_CARE_PROVIDER_SITE_OTHER): Payer: Medicare Other

## 2014-05-28 ENCOUNTER — Telehealth: Payer: Self-pay | Admitting: Internal Medicine

## 2014-05-28 ENCOUNTER — Other Ambulatory Visit: Payer: Self-pay | Admitting: Internal Medicine

## 2014-05-28 ENCOUNTER — Telehealth: Payer: Self-pay | Admitting: *Deleted

## 2014-05-28 DIAGNOSIS — Z79899 Other long term (current) drug therapy: Secondary | ICD-10-CM

## 2014-05-28 DIAGNOSIS — E039 Hypothyroidism, unspecified: Secondary | ICD-10-CM | POA: Diagnosis not present

## 2014-05-28 DIAGNOSIS — R600 Localized edema: Secondary | ICD-10-CM

## 2014-05-28 MED ORDER — FUROSEMIDE 20 MG PO TABS: 20.0000 mg | ORAL_TABLET | Freq: Every day | ORAL | Status: DC

## 2014-05-28 MED ORDER — TORSEMIDE 5 MG PO TABS: 5.0000 mg | ORAL_TABLET | Freq: Every day | ORAL | Status: DC

## 2014-05-28 NOTE — Telephone Encounter (Signed)
Patient is not taking furosemide,  Her chart says torsemide. Please call patient and verify request from  [place] office

## 2014-05-28 NOTE — Telephone Encounter (Signed)
Patient notified script called to Pharmacy and called and cancelled Lasix at Pharmacy. Torsemide filled instead per Dr. Derrel Nip

## 2014-05-28 NOTE — Telephone Encounter (Signed)
Furosemide is not on list, but Ok to refill,  Refill sent

## 2014-05-28 NOTE — Telephone Encounter (Signed)
Pt came in today and stated needed a refill on furosemide 20mg  and would like it to be sent to [facility] in [place].

## 2014-05-28 NOTE — Telephone Encounter (Signed)
Pt says that she called this morning to have her tsh checked, no order placed ?  Will collect standard tubes

## 2014-05-28 NOTE — Telephone Encounter (Signed)
Last OV with MD 12/14 ok to fill?

## 2014-05-29 DIAGNOSIS — M76899 Other specified enthesopathies of unspecified lower limb, excluding foot: Secondary | ICD-10-CM | POA: Diagnosis not present

## 2014-05-29 LAB — COMPREHENSIVE METABOLIC PANEL
ALT: 13 U/L (ref 0–35)
AST: 26 U/L (ref 0–37)
Albumin: 4 g/dL (ref 3.5–5.2)
Alkaline Phosphatase: 50 U/L (ref 39–117)
BUN: 16 mg/dL (ref 6–23)
CO2: 30 mEq/L (ref 19–32)
Calcium: 9.3 mg/dL (ref 8.4–10.5)
Chloride: 100 mEq/L (ref 96–112)
Creatinine, Ser: 0.7 mg/dL (ref 0.4–1.2)
GFR: 84.63 mL/min (ref >60.00)
Glucose, Bld: 102 mg/dL — ABNORMAL HIGH (ref 70–99)
Potassium: 3.7 mEq/L (ref 3.5–5.1)
Sodium: 139 mEq/L (ref 135–145)
Total Bilirubin: 0.6 mg/dL (ref 0.2–1.2)
Total Protein: 6.4 g/dL (ref 6.0–8.3)

## 2014-05-29 LAB — TSH: TSH: 0.9 u[IU]/mL (ref 0.35–4.50)

## 2014-05-29 NOTE — Progress Notes (Signed)
Completed as requested

## 2014-05-30 ENCOUNTER — Ambulatory Visit
Admission: RE | Admit: 2014-05-30 | Discharge: 2014-05-30 | Disposition: A | Discharge status: Home / Self Care | Payer: Medicare Other | Source: Ambulatory Visit

## 2014-05-30 ENCOUNTER — Encounter: Payer: Self-pay | Admitting: *Deleted

## 2014-05-30 DIAGNOSIS — Z1231 Encounter for screening mammogram for malignant neoplasm of breast: Secondary | ICD-10-CM | POA: Diagnosis not present

## 2014-06-06 ENCOUNTER — Other Ambulatory Visit: Payer: Self-pay | Admitting: Internal Medicine

## 2014-06-19 DIAGNOSIS — M6790 Unspecified disorder of synovium and tendon, unspecified site: Secondary | ICD-10-CM | POA: Diagnosis not present

## 2014-06-27 DIAGNOSIS — M76899 Other specified enthesopathies of unspecified lower limb, excluding foot: Secondary | ICD-10-CM | POA: Diagnosis not present

## 2014-06-28 DIAGNOSIS — G5620 Lesion of ulnar nerve, unspecified upper limb: Secondary | ICD-10-CM | POA: Diagnosis not present

## 2014-07-03 DIAGNOSIS — M961 Postlaminectomy syndrome, not elsewhere classified: Secondary | ICD-10-CM | POA: Diagnosis not present

## 2014-07-03 DIAGNOSIS — Z79899 Other long term (current) drug therapy: Secondary | ICD-10-CM | POA: Diagnosis not present

## 2014-07-03 DIAGNOSIS — M25559 Pain in unspecified hip: Secondary | ICD-10-CM | POA: Diagnosis not present

## 2014-07-03 DIAGNOSIS — G894 Chronic pain syndrome: Secondary | ICD-10-CM | POA: Diagnosis not present

## 2014-07-06 DIAGNOSIS — Z23 Encounter for immunization: Secondary | ICD-10-CM | POA: Diagnosis not present

## 2014-07-11 ENCOUNTER — Encounter: Payer: Medicare Other | Admitting: Podiatry

## 2014-07-11 DIAGNOSIS — M7071 Other bursitis of hip, right hip: Secondary | ICD-10-CM | POA: Diagnosis not present

## 2014-07-16 ENCOUNTER — Ambulatory Visit (INDEPENDENT_AMBULATORY_CARE_PROVIDER_SITE_OTHER): Payer: Medicare Other | Admitting: Podiatry

## 2014-07-16 ENCOUNTER — Encounter: Payer: Self-pay | Admitting: Podiatry

## 2014-07-16 VITALS — BP 99/67 | HR 75 | Resp 16

## 2014-07-16 DIAGNOSIS — M7071 Other bursitis of hip, right hip: Secondary | ICD-10-CM | POA: Diagnosis not present

## 2014-07-16 DIAGNOSIS — Z9889 Other specified postprocedural states: Secondary | ICD-10-CM

## 2014-07-16 NOTE — Progress Notes (Signed)
She presents today status post hammertoe repair #2 and #3 of the left foot with osteotomies metatarsal #2 and #3 of the left foot. Her date of surgery was 01/11/2014. She's concerned that the toes are still not laying down the way that she would like to have been. She states that she would like to have the screws removed from the toes at all possible.  Objective: Vital signs are stable she is alert and oriented x3. Her toes a rectus she has good range of motion with full plantar flexion and dorsiflexion at the metatarsophalangeal joint.  Assessment: Well-healing surgical foot left.  Plan: Consider removing the screws from toes 2 and #3 left foot in December. Radiographs will be taken the next time she comes in for an evaluation.

## 2014-07-18 DIAGNOSIS — M7071 Other bursitis of hip, right hip: Secondary | ICD-10-CM | POA: Diagnosis not present

## 2014-07-23 DIAGNOSIS — M7071 Other bursitis of hip, right hip: Secondary | ICD-10-CM | POA: Diagnosis not present

## 2014-07-25 DIAGNOSIS — M7071 Other bursitis of hip, right hip: Secondary | ICD-10-CM | POA: Diagnosis not present

## 2014-07-30 DIAGNOSIS — M7071 Other bursitis of hip, right hip: Secondary | ICD-10-CM | POA: Diagnosis not present

## 2014-08-02 DIAGNOSIS — M7071 Other bursitis of hip, right hip: Secondary | ICD-10-CM | POA: Diagnosis not present

## 2014-08-05 ENCOUNTER — Encounter: Payer: Self-pay | Admitting: Podiatry

## 2014-08-27 DIAGNOSIS — G894 Chronic pain syndrome: Secondary | ICD-10-CM | POA: Diagnosis not present

## 2014-08-27 DIAGNOSIS — G4700 Insomnia, unspecified: Secondary | ICD-10-CM | POA: Diagnosis not present

## 2014-08-27 DIAGNOSIS — Z79891 Long term (current) use of opiate analgesic: Secondary | ICD-10-CM | POA: Diagnosis not present

## 2014-08-27 DIAGNOSIS — M961 Postlaminectomy syndrome, not elsewhere classified: Secondary | ICD-10-CM | POA: Diagnosis not present

## 2014-09-03 ENCOUNTER — Encounter: Payer: Self-pay | Admitting: Podiatry

## 2014-09-03 ENCOUNTER — Ambulatory Visit (INDEPENDENT_AMBULATORY_CARE_PROVIDER_SITE_OTHER): Payer: Medicare Other

## 2014-09-03 ENCOUNTER — Ambulatory Visit (INDEPENDENT_AMBULATORY_CARE_PROVIDER_SITE_OTHER): Payer: Medicare Other | Admitting: Podiatry

## 2014-09-03 VITALS — BP 98/66 | HR 60 | Resp 16

## 2014-09-03 DIAGNOSIS — Z9889 Other specified postprocedural states: Secondary | ICD-10-CM

## 2014-09-03 DIAGNOSIS — M2042 Other hammer toe(s) (acquired), left foot: Secondary | ICD-10-CM

## 2014-09-03 NOTE — Progress Notes (Signed)
She presents today for surgical consult regarding her left foot. She is unclear as to whether or not she were once removed the screws from toes #2 and #3 of the left foot were replaced there last year.  Objective: Vital signs are stable she is alert and oriented 3. She has complete arthrodesis to the PIPJ's via radiographs. She has also completely healed second and third metatarsal osteotomies. Her toes are slightly dorsiflexed and obviously not relaxed. She also has an undercutting adductovarus hammertoe deformity fourth left.  Assessment: Painful internal fixation second third digits left foot hammertoe deformity fourth left.  Plan: Discussed etiology pathology conservative versus surgical therapies. She is unclear as to whether or not she wants to have this procedure performed into what extent she would like to have this performed she will discuss this with her family and notify us as to the date and time. We will then have her in for a signing of her consent form.

## 2014-09-04 DIAGNOSIS — L570 Actinic keratosis: Secondary | ICD-10-CM | POA: Diagnosis not present

## 2014-09-04 DIAGNOSIS — Z85828 Personal history of other malignant neoplasm of skin: Secondary | ICD-10-CM | POA: Diagnosis not present

## 2014-10-22 DIAGNOSIS — M961 Postlaminectomy syndrome, not elsewhere classified: Secondary | ICD-10-CM | POA: Diagnosis not present

## 2014-10-22 DIAGNOSIS — G894 Chronic pain syndrome: Secondary | ICD-10-CM | POA: Diagnosis not present

## 2014-10-22 DIAGNOSIS — G4700 Insomnia, unspecified: Secondary | ICD-10-CM | POA: Diagnosis not present

## 2014-10-22 DIAGNOSIS — Z79891 Long term (current) use of opiate analgesic: Secondary | ICD-10-CM | POA: Diagnosis not present

## 2014-12-05 ENCOUNTER — Other Ambulatory Visit: Payer: Self-pay | Admitting: Internal Medicine

## 2014-12-05 MED ORDER — SYNTHROID 125 MCG PO TABS: 125.0000 ug | ORAL_TABLET | Freq: Every day | ORAL | Status: DC

## 2014-12-05 NOTE — Telephone Encounter (Signed)
Refill sent for synthroid as requested to [facility].

## 2014-12-19 DIAGNOSIS — G4700 Insomnia, unspecified: Secondary | ICD-10-CM | POA: Diagnosis not present

## 2014-12-19 DIAGNOSIS — Z79891 Long term (current) use of opiate analgesic: Secondary | ICD-10-CM | POA: Diagnosis not present

## 2014-12-19 DIAGNOSIS — G894 Chronic pain syndrome: Secondary | ICD-10-CM | POA: Diagnosis not present

## 2014-12-19 DIAGNOSIS — M961 Postlaminectomy syndrome, not elsewhere classified: Secondary | ICD-10-CM | POA: Diagnosis not present

## 2015-01-16 ENCOUNTER — Encounter: Payer: Self-pay | Admitting: Internal Medicine

## 2015-01-16 ENCOUNTER — Ambulatory Visit (INDEPENDENT_AMBULATORY_CARE_PROVIDER_SITE_OTHER): Payer: Medicare Other | Admitting: Internal Medicine

## 2015-01-16 VITALS — BP 102/70 | HR 61 | Temp 98.4°F | Resp 14 | Ht 67.0 in | Wt 135.5 lb

## 2015-01-16 DIAGNOSIS — D62 Acute posthemorrhagic anemia: Secondary | ICD-10-CM

## 2015-01-16 DIAGNOSIS — E038 Other specified hypothyroidism: Secondary | ICD-10-CM

## 2015-01-16 DIAGNOSIS — M5137 Other intervertebral disc degeneration, lumbosacral region: Secondary | ICD-10-CM

## 2015-01-16 DIAGNOSIS — E559 Vitamin D deficiency, unspecified: Secondary | ICD-10-CM

## 2015-01-16 DIAGNOSIS — E785 Hyperlipidemia, unspecified: Secondary | ICD-10-CM | POA: Diagnosis not present

## 2015-01-16 DIAGNOSIS — M8580 Other specified disorders of bone density and structure, unspecified site: Secondary | ICD-10-CM

## 2015-01-16 DIAGNOSIS — R600 Localized edema: Secondary | ICD-10-CM

## 2015-01-16 DIAGNOSIS — Z1239 Encounter for other screening for malignant neoplasm of breast: Secondary | ICD-10-CM | POA: Diagnosis not present

## 2015-01-16 DIAGNOSIS — Z1159 Encounter for screening for other viral diseases: Secondary | ICD-10-CM | POA: Diagnosis not present

## 2015-01-16 DIAGNOSIS — R5383 Other fatigue: Secondary | ICD-10-CM | POA: Diagnosis not present

## 2015-01-16 DIAGNOSIS — Z0000 Encounter for general adult medical examination without abnormal findings: Secondary | ICD-10-CM

## 2015-01-16 DIAGNOSIS — E034 Atrophy of thyroid (acquired): Secondary | ICD-10-CM

## 2015-01-16 LAB — CBC WITH DIFFERENTIAL/PLATELET
Basophils Absolute: 0 10*3/uL (ref 0.0–0.1)
Basophils Relative: 0.5 % (ref 0.0–3.0)
Eosinophils Absolute: 0 10*3/uL (ref 0.0–0.7)
Eosinophils Relative: 0.9 % (ref 0.0–5.0)
HCT: 40.8 % (ref 36.0–46.0)
Hemoglobin: 13.8 g/dL (ref 12.0–15.0)
Lymphocytes Relative: 24.7 % (ref 12.0–46.0)
Lymphs Abs: 1.2 10*3/uL (ref 0.7–4.0)
MCHC: 33.9 g/dL (ref 30.0–36.0)
MCV: 92.8 fl (ref 78.0–100.0)
Monocytes Absolute: 0.3 10*3/uL (ref 0.1–1.0)
Monocytes Relative: 5.8 % (ref 3.0–12.0)
Neutro Abs: 3.2 10*3/uL (ref 1.4–7.7)
Neutrophils Relative %: 68.1 % (ref 43.0–77.0)
Platelets: 152 10*3/uL (ref 150.0–400.0)
RBC: 4.4 Mil/uL (ref 3.87–5.11)
RDW: 13 % (ref 11.5–15.5)
WBC: 4.8 10*3/uL (ref 4.0–10.5)

## 2015-01-16 LAB — COMPREHENSIVE METABOLIC PANEL
ALT: 14 U/L (ref 0–35)
AST: 23 U/L (ref 0–37)
Albumin: 4.4 g/dL (ref 3.5–5.2)
Alkaline Phosphatase: 49 U/L (ref 39–117)
BUN: 19 mg/dL (ref 6–23)
CO2: 34 mEq/L — ABNORMAL HIGH (ref 19–32)
Calcium: 10.2 mg/dL (ref 8.4–10.5)
Chloride: 99 mEq/L (ref 96–112)
Creatinine, Ser: 0.76 mg/dL (ref 0.40–1.20)
GFR: 80.63 mL/min (ref >60.00)
Glucose, Bld: 98 mg/dL (ref 70–99)
Potassium: 3.9 mEq/L (ref 3.5–5.1)
Sodium: 138 mEq/L (ref 135–145)
Total Bilirubin: 0.6 mg/dL (ref 0.2–1.2)
Total Protein: 7.3 g/dL (ref 6.0–8.3)

## 2015-01-16 LAB — VITAMIN D 25 HYDROXY (VIT D DEFICIENCY, FRACTURES): VITD: 38.13 ng/mL (ref 30.00–100.00)

## 2015-01-16 LAB — LIPID PANEL
Cholesterol: 225 mg/dL — ABNORMAL HIGH (ref 0–200)
HDL: 79.9 mg/dL (ref >39.00)
LDL Cholesterol: 124 mg/dL — ABNORMAL HIGH (ref 0–99)
NonHDL: 145.1
Total CHOL/HDL Ratio: 3
Triglycerides: 104 mg/dL (ref 0.0–149.0)
VLDL: 20.8 mg/dL (ref 0.0–40.0)

## 2015-01-16 LAB — TSH: TSH: 2.33 u[IU]/mL (ref 0.35–4.50)

## 2015-01-16 MED ORDER — LINACLOTIDE 290 MCG PO CAPS: 290.0000 ug | ORAL_CAPSULE | Freq: Every day | ORAL | Status: DC

## 2015-01-16 MED ORDER — TETANUS-DIPHTH-ACELL PERTUSSIS 5-2.5-18.5 LF-MCG/0.5 IM SUSP: 0.5000 mL | Freq: Once | INTRAMUSCULAR | Status: DC

## 2015-01-16 MED ORDER — MOMETASONE FUROATE 50 MCG/ACT NA SUSP
2.0000 | Freq: Every day | NASAL | Status: DC
Start: 2015-01-16 — End: 2015-09-24

## 2015-01-16 NOTE — Progress Notes (Signed)
Patient ID: Sonya Cordova, female   DOB: 1946-09-18, 69 y.o.   MRN: [id number]   Subjective:    The patient is here for annual Medicare wellness examination and management of other chronic and acute problems.  Chronic pain involving  Ribcage,  Most joints,  Lower back ,  Managed by pain Clinic.  Some days worse than others.  Cannot exercise due to pain issues    Feels like her right foot is sliding when she walks.  She has not fallen, and does not drag or stumble over feet. Has Peripheral neuropathy from spinal stenosis,  Feet feel numb .  Sleeps on side,  But when she wakes up on her back she has moderate  heel pain    Underwent Mohs for Skin cancer  On tip of nose,  /skin graft Robinson   Wears sun block    The risk factors are reflected in the social history.  The roster of all physicians providing medical care to patient - is listed in the Snapshot section of the chart.  Activities of daily living:  The patient is 100% independent in all ADLs: dressing, toileting, feeding as well as independent mobility  Home safety : The patient has smoke detectors in the home. They wear seatbelts.  There are no firearms at home. There is no violence in the home.   There is no risks for hepatitis, STDs or HIV. There is no   history of blood transfusion. They have no travel history to infectious disease endemic areas of the world.  The patient has seen their dentist in the last six month. They have seen their eye doctor in the last year. They admit to slight hearing difficulty with regard to whispered voices and some television programs.  They have deferred audiologic testing in the last year.  They do not  have excessive sun exposure. Discussed the need for sun protection: hats, long sleeves and use of sunscreen if there is significant sun exposure.   Diet: the importance of a healthy diet is discussed. They do have a healthy diet.  The benefits of regular aerobic exercise were discussed. She  walks 4 times per week ,  20 minutes.   Depression screen: there are no signs or vegative symptoms of depression- irritability, change in appetite, anhedonia, sadness/tearfullness.  Cognitive assessment: the patient manages all their financial and personal affairs and is actively engaged. They could relate day,date,year and events; recalled 2/3 objects at 3 minutes; performed clock-face test normally.  The following portions of the patient's history were reviewed and updated as appropriate: allergies, current medications, past family history, past medical history,  past surgical history, past social history  and problem list.  Visual acuity was not assessed per patient preference since she has regular follow up with her ophthalmologist. Hearing and body mass index were assessed and reviewed.   During the course of the visit the patient was educated and counseled about appropriate screening and preventive services including : fall prevention , diabetes screening, nutrition counseling, colorectal cancer screening, and recommended immunizations.    Health Maintenance  Topic Date Due  . TETANUS/TDAP  11/02/1966  . ZOSTAVAX  11/03/2007  . PNA vac Low Risk Adult (2 of 2 - PPSV23) 08/10/2014  . INFLUENZA VACCINE  05/05/2015  . MAMMOGRAM  05/29/2015  . COLONOSCOPY  11/01/2020  . DEXA SCAN  Completed    The following portions of the patient's history were reviewed and updated as appropriate: allergies, current medications, past  family history, past medical history, past social history, past surgical history and problem list.  Review of Systems A comprehensive review of systems was negative.   Objective:   BP 102/70 mmHg  Pulse 61  Temp(Src) 98.4 F (36.9 C) (Oral)  Resp 14  Ht 5\' 7"  (1.702 m)  Wt 135 lb 8 oz (61.462 kg)  BMI 21.22 kg/m2  SpO2 96%  General appearance: alert, cooperative and appears stated age Head: Normocephalic, without obvious abnormality, atraumatic Eyes:  conjunctivae/corneas clear. PERRL, EOM's intact. Fundi benign. Ears: normal TM's and external ear canals both ears Nose: Nares normal. Septum midline. Mucosa normal. No drainage or sinus tenderness. Throat: lips, mucosa, and tongue normal; teeth and gums normal Neck: no adenopathy, no carotid bruit, no JVD, supple, symmetrical, trachea midline and thyroid not enlarged, symmetric, no tenderness/mass/nodules Lungs: clear to auscultation bilaterally Breasts: normal appearance, no masses or tenderness Heart: regular rate and rhythm, S1, S2 normal, no murmur, click, rub or gallop Abdomen: soft, non-tender; bowel sounds normal; no masses,  no organomegaly Extremities: extremities normal, atraumatic, no cyanosis or edema Pulses: 2+ and symmetric Skin: Skin color, texture, turgor normal. No rashes or lesions Neurologic: Alert and oriented X 3, normal strength and tone. Normal symmetric reflexes. Normal coordination and gait.    Assessment and Plan:    Problem List Items Addressed This Visit      Unprioritized   Degeneration of lumbar or lumbosacral intervertebral disc   Osteopenia    By DEXA 2013.  Secondary to multiple factors including priro steroid use.  Repeat due in 2018,  Discussed the current controversies surrounding the risks and benefits of calcium supplementation.  Encouraged her to increase dietary calcium through natural foods including almond/coconut milk      Postoperative anemia due to acute blood loss    Occurred in 2013 post total knee replacement. Resolved.   Lab Results  Component Value Date   WBC 4.8 01/16/2015   HGB 13.8 01/16/2015   HCT 40.8 01/16/2015   MCV 92.8 01/16/2015   PLT 152.0 01/16/2015         Edema of both legs    Secondary to venous insufficiency.  Managed with compression stockings worn daily.       Hypothyroidism    Thyroid function is WNL on current dose.  No current changes needed.   Lab Results  Component Value Date   TSH 2.33  01/16/2015         Medicare annual wellness visit, subsequent    Annual Medicare wellness  exam was done as well as a comprehensive physical exam and management of acute and chronic conditions .  During the course of the visit the patient was educated and counseled about appropriate screening and preventive services including : fall prevention , diabetes screening, nutrition counseling, colorectal cancer screening, and recommended immunizations.  Printed recommendations for health maintenance screenings was given.        Other Visit Diagnoses    Breast cancer screening    -  Primary    Relevant Orders    MM DIGITAL SCREENING BILATERAL    Other fatigue        Relevant Orders    CBC with Differential/Platelet (Completed)    Comprehensive metabolic panel (Completed)    TSH (Completed)    Need for hepatitis C screening test        Relevant Orders    Hepatitis C antibody (Completed)    Hyperlipemia        Relevant Orders  Lipid panel (Completed)    Vitamin D deficiency        Relevant Orders    Vit D  25 hydroxy (rtn osteoporosis monitoring) (Completed)

## 2015-01-16 NOTE — Patient Instructions (Addendum)
I recommend getting the majority of your calcium and Vitamin D  through diet rather than supplements given the recent association of calcium supplements with increased coronary artery calcium scores  You need a minimum of 1200 mg  Try the Silk almond Light,  Original formula.  Delicious,  Low carb,  Low cal,  Cholesterol free  Labs today including cit d,  Thyroid,  Hep c ,  Cholesterol,  CBC and liver/kidney  Health Maintenance Adopting a healthy lifestyle and getting preventive care can go a long way to promote health and wellness. Talk with your health care provider about what schedule of regular examinations is right for you. This is a good chance for you to check in with your provider about disease prevention and staying healthy. In between checkups, there are plenty of things you can do on your own. Experts have done a lot of research about which lifestyle changes and preventive measures are most likely to keep you healthy. Ask your health care provider for more information. WEIGHT AND DIET  Eat a healthy diet  Be sure to include plenty of vegetables, fruits, low-fat dairy products, and lean protein.  Do not eat a lot of foods high in solid fats, added sugars, or salt.  Get regular exercise. This is one of the most important things you can do for your health.  Most adults should exercise for at least 150 minutes each week. The exercise should increase your heart rate and make you sweat (moderate-intensity exercise).  Most adults should also do strengthening exercises at least twice a week. This is in addition to the moderate-intensity exercise.  Maintain a healthy weight  Body mass index (BMI) is a measurement that can be used to identify possible weight problems. It estimates body fat based on height and weight. Your health care provider can help determine your BMI and help you achieve or maintain a healthy weight.  For females 4 years of age and older:   A BMI below 18.5 is  considered underweight.  A BMI of 18.5 to 24.9 is normal.  A BMI of 25 to 29.9 is considered overweight.  A BMI of 30 and above is considered obese.  Watch levels of cholesterol and blood lipids  You should start having your blood tested for lipids and cholesterol at 67 years of age, then have this test every 5 years.  You may need to have your cholesterol levels checked more often if:  Your lipid or cholesterol levels are high.  You are older than 67 years of age.  You are at high risk for heart disease.  CANCER SCREENING   Lung Cancer  Lung cancer screening is recommended for adults 40-27 years old who are at high risk for lung cancer because of a history of smoking.  A yearly low-dose CT scan of the lungs is recommended for people who:  Currently smoke.  Have quit within the past 15 years.  Have at least a 30-pack-year history of smoking. A pack year is smoking an average of one pack of cigarettes a day for 1 year.  Yearly screening should continue until it has been 15 years since you quit.  Yearly screening should stop if you develop a health problem that would prevent you from having lung cancer treatment.  Breast Cancer  Practice breast self-awareness. This means understanding how your breasts normally appear and feel.  It also means doing regular breast self-exams. Let your health care provider know about any changes, no matter how  small.  If you are in your 20s or 30s, you should have a clinical breast exam (CBE) by a health care provider every 1-3 years as part of a regular health exam.  If you are 40 or older, have a CBE every year. Also consider having a breast X-ray (mammogram) every year.  If you have a family history of breast cancer, talk to your health care provider about genetic screening.  If you are at high risk for breast cancer, talk to your health care provider about having an MRI and a mammogram every year.  Breast cancer gene (BRCA)  assessment is recommended for women who have family members with BRCA-related cancers. BRCA-related cancers include:  Breast.  Ovarian.  Tubal.  Peritoneal cancers.  Results of the assessment will determine the need for genetic counseling and BRCA1 and BRCA2 testing. Cervical Cancer Routine pelvic examinations to screen for cervical cancer are no longer recommended for nonpregnant women who are considered low risk for cancer of the pelvic organs (ovaries, uterus, and vagina) and who do not have symptoms. A pelvic examination may be necessary if you have symptoms including those associated with pelvic infections. Ask your health care provider if a screening pelvic exam is right for you.   The Pap test is the screening test for cervical cancer for women who are considered at risk.  If you had a hysterectomy for a problem that was not cancer or a condition that could lead to cancer, then you no longer need Pap tests.  If you are older than 65 years, and you have had normal Pap tests for the past 10 years, you no longer need to have Pap tests.  If you have had past treatment for cervical cancer or a condition that could lead to cancer, you need Pap tests and screening for cancer for at least 20 years after your treatment.  If you no longer get a Pap test, assess your risk factors if they change (such as having a new sexual partner). This can affect whether you should start being screened again.  Some women have medical problems that increase their chance of getting cervical cancer. If this is the case for you, your health care provider may recommend more frequent screening and Pap tests.  The human papillomavirus (HPV) test is another test that may be used for cervical cancer screening. The HPV test looks for the virus that can cause cell changes in the cervix. The cells collected during the Pap test can be tested for HPV.  The HPV test can be used to screen women 62 years of age and older.  Getting tested for HPV can extend the interval between normal Pap tests from three to five years.  An HPV test also should be used to screen women of any age who have unclear Pap test results.  After 67 years of age, women should have HPV testing as often as Pap tests.  Colorectal Cancer  This type of cancer can be detected and often prevented.  Routine colorectal cancer screening usually begins at 67 years of age and continues through 67 years of age.  Your health care provider may recommend screening at an earlier age if you have risk factors for colon cancer.  Your health care provider may also recommend using home test kits to check for hidden blood in the stool.  A small camera at the end of a tube can be used to examine your colon directly (sigmoidoscopy or colonoscopy). This is done to  to check for the earliest forms of colorectal cancer.  Routine screening usually begins at age 50.  Direct examination of the colon should be repeated every 5-10 years through 67 years of age. However, you may need to be screened more often if early forms of precancerous polyps or small growths are found. Skin Cancer  Check your skin from head to toe regularly.  Tell your health care provider about any new moles or changes in moles, especially if there is a change in a mole's shape or color.  Also tell your health care provider if you have a mole that is larger than the size of a pencil eraser.  Always use sunscreen. Apply sunscreen liberally and repeatedly throughout the day.  Protect yourself by wearing long sleeves, pants, a wide-brimmed hat, and sunglasses whenever you are outside. HEART DISEASE, DIABETES, AND HIGH BLOOD PRESSURE   Have your blood pressure checked at least every 1-2 years. High blood pressure causes heart disease and increases the risk of stroke.  If you are between 55 years and 79 years old, ask your health care provider if you should take aspirin to prevent  strokes.  Have regular diabetes screenings. This involves taking a blood sample to check your fasting blood sugar level.  If you are at a normal weight and have a low risk for diabetes, have this test once every three years after 67 years of age.  If you are overweight and have a high risk for diabetes, consider being tested at a younger age or more often. PREVENTING INFECTION  Hepatitis B  If you have a higher risk for hepatitis B, you should be screened for this virus. You are considered at high risk for hepatitis B if:  You were born in a country where hepatitis B is common. Ask your health care provider which countries are considered high risk.  Your parents were born in a high-risk country, and you have not been immunized against hepatitis B (hepatitis B vaccine).  You have HIV or AIDS.  You use needles to inject street drugs.  You live with someone who has hepatitis B.  You have had sex with someone who has hepatitis B.  You get hemodialysis treatment.  You take certain medicines for conditions, including cancer, organ transplantation, and autoimmune conditions. Hepatitis C  Blood testing is recommended for:  Everyone born from 1945 through 1965.  Anyone with known risk factors for hepatitis C. Sexually transmitted infections (STIs)  You should be screened for sexually transmitted infections (STIs) including gonorrhea and chlamydia if:  You are sexually active and are younger than 67 years of age.  You are older than 67 years of age and your health care provider tells you that you are at risk for this type of infection.  Your sexual activity has changed since you were last screened and you are at an increased risk for chlamydia or gonorrhea. Ask your health care provider if you are at risk.  If you do not have HIV, but are at risk, it may be recommended that you take a prescription medicine daily to prevent HIV infection. This is called pre-exposure prophylaxis  (PrEP). You are considered at risk if:  You are sexually active and do not regularly use condoms or know the HIV status of your partner(s).  You take drugs by injection.  You are sexually active with a partner who has HIV. Talk with your health care provider about whether you are at high risk of being infected with   If you choose to begin PrEP, you should first be tested for HIV. You should then be tested every 3 months for as long as you are taking PrEP.  PREGNANCY   If you are premenopausal and you may become pregnant, ask your health care provider about preconception counseling.  If you may become pregnant, take 400 to 800 micrograms (mcg) of folic acid every day.  If you want to prevent pregnancy, talk to your health care provider about birth control (contraception). OSTEOPOROSIS AND MENOPAUSE   Osteoporosis is a disease in which the bones lose minerals and strength with aging. This can result in serious bone fractures. Your risk for osteoporosis can be identified using a bone density scan.  If you are 65 years of age or older, or if you are at risk for osteoporosis and fractures, ask your health care provider if you should be screened.  Ask your health care provider whether you should take a calcium or vitamin D supplement to lower your risk for osteoporosis.  Menopause may have certain physical symptoms and risks.  Hormone replacement therapy may reduce some of these symptoms and risks. Talk to your health care provider about whether hormone replacement therapy is right for you.  HOME CARE INSTRUCTIONS   Schedule regular health, dental, and eye exams.  Stay current with your immunizations.   Do not use any tobacco products including cigarettes, chewing tobacco, or electronic cigarettes.  If you are pregnant, do not drink alcohol.  If you are breastfeeding, limit how much and how often you drink alcohol.  Limit alcohol intake to no more than 1 drink per day for  nonpregnant women. One drink equals 12 ounces of beer, 5 ounces of wine, or 1 ounces of hard liquor.  Do not use street drugs.  Do not share needles.  Ask your health care provider for help if you need support or information about quitting drugs.  Tell your health care provider if you often feel depressed.  Tell your health care provider if you have ever been abused or do not feel safe at home. Document Released: 04/05/2011 Document Revised: 02/04/2014 Document Reviewed: 08/22/2013 [facility] Patient Information 2015 [place], [place]. This information is not intended to replace advice given to you by your health care provider. Make sure you discuss any questions you have with your health care provider.

## 2015-01-16 NOTE — Progress Notes (Signed)
Pre-visit discussion using our clinic review tool. No additional management support is needed unless otherwise documented below in the visit note.  

## 2015-01-17 LAB — HEPATITIS C ANTIBODY: HCV Ab: NEGATIVE

## 2015-01-19 ENCOUNTER — Encounter: Payer: Self-pay | Admitting: Internal Medicine

## 2015-01-19 DIAGNOSIS — Z0000 Encounter for general adult medical examination without abnormal findings: Secondary | ICD-10-CM | POA: Insufficient documentation

## 2015-01-19 NOTE — Assessment & Plan Note (Signed)
Occurred in 2013 post total knee replacement. Resolved.   Lab Results  Component Value Date   WBC 4.8 01/16/2015   HGB 13.8 01/16/2015   HCT 40.8 01/16/2015   MCV 92.8 01/16/2015   PLT 152.0 01/16/2015

## 2015-01-19 NOTE — Assessment & Plan Note (Addendum)
By DEXA 2013.  Secondary to multiple factors including priro steroid use.  Repeat due in 2018,  Discussed the current controversies surrounding the risks and benefits of calcium supplementation.  Encouraged her to increase dietary calcium through natural foods including almond/coconut milk

## 2015-01-19 NOTE — Assessment & Plan Note (Signed)
Secondary to venous insufficiency.  Managed with compression stockings worn daily.

## 2015-01-19 NOTE — Assessment & Plan Note (Signed)
Thyroid function is WNL on current dose.  No current changes needed.   Lab Results  Component Value Date   TSH 2.33 01/16/2015

## 2015-01-19 NOTE — Assessment & Plan Note (Signed)

## 2015-01-20 ENCOUNTER — Encounter: Payer: Self-pay | Admitting: *Deleted

## 2015-01-22 ENCOUNTER — Other Ambulatory Visit: Payer: Self-pay | Admitting: Dermatology

## 2015-01-22 DIAGNOSIS — D239 Other benign neoplasm of skin, unspecified: Secondary | ICD-10-CM | POA: Diagnosis not present

## 2015-01-22 DIAGNOSIS — L719 Rosacea, unspecified: Secondary | ICD-10-CM | POA: Diagnosis not present

## 2015-01-22 DIAGNOSIS — D2272 Melanocytic nevi of left lower limb, including hip: Secondary | ICD-10-CM | POA: Diagnosis not present

## 2015-01-22 DIAGNOSIS — D2371 Other benign neoplasm of skin of right lower limb, including hip: Secondary | ICD-10-CM | POA: Diagnosis not present

## 2015-01-22 DIAGNOSIS — D485 Neoplasm of uncertain behavior of skin: Secondary | ICD-10-CM | POA: Diagnosis not present

## 2015-01-22 DIAGNOSIS — D225 Melanocytic nevi of trunk: Secondary | ICD-10-CM | POA: Diagnosis not present

## 2015-01-22 DIAGNOSIS — Z85828 Personal history of other malignant neoplasm of skin: Secondary | ICD-10-CM | POA: Diagnosis not present

## 2015-01-22 DIAGNOSIS — L821 Other seborrheic keratosis: Secondary | ICD-10-CM | POA: Diagnosis not present

## 2015-02-13 DIAGNOSIS — G4700 Insomnia, unspecified: Secondary | ICD-10-CM | POA: Diagnosis not present

## 2015-02-13 DIAGNOSIS — G894 Chronic pain syndrome: Secondary | ICD-10-CM | POA: Diagnosis not present

## 2015-02-13 DIAGNOSIS — M961 Postlaminectomy syndrome, not elsewhere classified: Secondary | ICD-10-CM | POA: Diagnosis not present

## 2015-02-13 DIAGNOSIS — Z79891 Long term (current) use of opiate analgesic: Secondary | ICD-10-CM | POA: Diagnosis not present

## 2015-02-26 DIAGNOSIS — M7071 Other bursitis of hip, right hip: Secondary | ICD-10-CM | POA: Diagnosis not present

## 2015-03-06 ENCOUNTER — Encounter: Payer: Medicare Other | Admitting: Gynecology

## 2015-04-01 DIAGNOSIS — Z23 Encounter for immunization: Secondary | ICD-10-CM | POA: Diagnosis not present

## 2015-04-15 DIAGNOSIS — G4700 Insomnia, unspecified: Secondary | ICD-10-CM | POA: Diagnosis not present

## 2015-04-15 DIAGNOSIS — Z79891 Long term (current) use of opiate analgesic: Secondary | ICD-10-CM | POA: Diagnosis not present

## 2015-04-15 DIAGNOSIS — M961 Postlaminectomy syndrome, not elsewhere classified: Secondary | ICD-10-CM | POA: Diagnosis not present

## 2015-04-15 DIAGNOSIS — G894 Chronic pain syndrome: Secondary | ICD-10-CM | POA: Diagnosis not present

## 2015-04-20 ENCOUNTER — Other Ambulatory Visit: Payer: Self-pay | Admitting: Gynecology

## 2015-04-21 ENCOUNTER — Other Ambulatory Visit: Payer: Self-pay | Admitting: *Deleted

## 2015-04-21 MED ORDER — ESTRADIOL 1 MG PO TABS: 1.0000 mg | ORAL_TABLET | Freq: Every day | ORAL | Status: DC

## 2015-04-29 ENCOUNTER — Other Ambulatory Visit: Payer: Self-pay

## 2015-04-29 DIAGNOSIS — Z1231 Encounter for screening mammogram for malignant neoplasm of breast: Secondary | ICD-10-CM

## 2015-06-04 ENCOUNTER — Ambulatory Visit
Admission: RE | Admit: 2015-06-04 | Discharge: 2015-06-04 | Disposition: A | Discharge status: Home / Self Care | Payer: Medicare Other | Source: Ambulatory Visit

## 2015-06-04 DIAGNOSIS — Z1231 Encounter for screening mammogram for malignant neoplasm of breast: Secondary | ICD-10-CM | POA: Diagnosis not present

## 2015-06-10 ENCOUNTER — Other Ambulatory Visit: Payer: Self-pay | Admitting: Internal Medicine

## 2015-06-10 DIAGNOSIS — G4700 Insomnia, unspecified: Secondary | ICD-10-CM | POA: Diagnosis not present

## 2015-06-10 DIAGNOSIS — G894 Chronic pain syndrome: Secondary | ICD-10-CM | POA: Diagnosis not present

## 2015-06-10 DIAGNOSIS — M961 Postlaminectomy syndrome, not elsewhere classified: Secondary | ICD-10-CM | POA: Diagnosis not present

## 2015-06-10 DIAGNOSIS — Z79891 Long term (current) use of opiate analgesic: Secondary | ICD-10-CM | POA: Diagnosis not present

## 2015-06-26 DIAGNOSIS — Z23 Encounter for immunization: Secondary | ICD-10-CM | POA: Diagnosis not present

## 2015-06-27 DIAGNOSIS — M19022 Primary osteoarthritis, left elbow: Secondary | ICD-10-CM | POA: Diagnosis not present

## 2015-07-07 ENCOUNTER — Other Ambulatory Visit: Payer: Self-pay | Admitting: Internal Medicine

## 2015-07-09 DIAGNOSIS — M25562 Pain in left knee: Secondary | ICD-10-CM | POA: Diagnosis not present

## 2015-07-15 DIAGNOSIS — M25562 Pain in left knee: Secondary | ICD-10-CM | POA: Diagnosis not present

## 2015-07-17 DIAGNOSIS — R262 Difficulty in walking, not elsewhere classified: Secondary | ICD-10-CM | POA: Diagnosis not present

## 2015-07-17 DIAGNOSIS — M546 Pain in thoracic spine: Secondary | ICD-10-CM | POA: Diagnosis not present

## 2015-07-17 DIAGNOSIS — M4014 Other secondary kyphosis, thoracic region: Secondary | ICD-10-CM | POA: Diagnosis not present

## 2015-07-17 DIAGNOSIS — M4125 Other idiopathic scoliosis, thoracolumbar region: Secondary | ICD-10-CM | POA: Diagnosis not present

## 2015-07-18 DIAGNOSIS — M25562 Pain in left knee: Secondary | ICD-10-CM | POA: Diagnosis not present

## 2015-07-21 DIAGNOSIS — M25562 Pain in left knee: Secondary | ICD-10-CM | POA: Diagnosis not present

## 2015-07-22 ENCOUNTER — Other Ambulatory Visit (HOSPITAL_COMMUNITY): Payer: Self-pay | Admitting: Orthopedic Surgery

## 2015-07-22 DIAGNOSIS — M4014 Other secondary kyphosis, thoracic region: Secondary | ICD-10-CM

## 2015-07-22 DIAGNOSIS — M546 Pain in thoracic spine: Secondary | ICD-10-CM

## 2015-07-22 DIAGNOSIS — M4125 Other idiopathic scoliosis, thoracolumbar region: Secondary | ICD-10-CM

## 2015-07-22 DIAGNOSIS — R262 Difficulty in walking, not elsewhere classified: Secondary | ICD-10-CM

## 2015-07-24 NOTE — Progress Notes (Signed)
Cardiology Office Note   Date:  07/25/2015   ID:  Sonya Cordova, DOB May 05, 1946, MRN [id number]  PCP:  Crecencio Mc, MD  Cardiologist:   Sharol Harness, MD   Chief Complaint  Patient presents with  . New Evaluation    surgical clearance spine, pt states no dizziness  . Chest Pain    off and on  . Shortness of Breath    no SOB  . Edema    in legs fee and ankle      History of Present Illness: Sonya Cordova is a 69 y.o. female with hypothyroidism who presents for pre-operative risk assessment. She has previously undergone 17 back or neck surgeries.  She was recently evaluated in a spine and scoliosis clinic where they recommended an additional surgery.  She has severe and worsening kyphosis and noted that she feels like there is a corset-like band that restricts her breathing at times.  This is worst at night when she is laying on one side.  She denies exertional shortness of breath or chest pain.  Sometimes she has difficulty expanding her rib cage to take a deep breath.  When she related these symptoms to her surgeon, they recommended that she undergo pre-surgical cardiac risk assessment.  Prior to one of her past surgeries she was noted to have an abnormal EKG and follows up with a cardiologist.  Upon follow up her EKG was normal.  She has no cardiac history and denies chest pain.  She does not get much exercise due to pain in her knees, shoulders and hips.  She is very conscientious about her diet.  She reports bilateral LE edema that has been ongoing for at least 3 years.  She wears compression stockinjg and prn diuretic.  She denies orthopnea or PND.  She also tries to avoid salt.    Past Medical History  Diagnosis Date  . Degenerative disc disease 1995  . Arthritis   . Connective tissue disease   . Osteopenia 02/2012    t score -1.1  . Family history of anesthesia complication   . Complication of anesthesia     "states remembers surgeon talking- was  light"  . Hypothyroidism   . Edema of both legs   . Headache(784.0)     migraines  . History of blood transfusion   . Anemia   . PONV (postoperative nausea and vomiting)   . Contact lens/glasses fitting     wears contacts or glasses  . Neuropathy   . Lymphedema   . Cancer     Basal cell    Past Surgical History  Procedure Laterality Date  . Back surgery      neck,, cervical, fusion 1995-2010 ( 17 surgeries)  . Vaginal hysterectomy  1986  . Thyroidectomy  1986  . Abdominal plasty    . Appendectomy    . Tonsillectomy and adenoidectomy    . Eye surgery      as a child  . Breast enhancement surgery  1978  . Tubal ligation    . Left elbow    . Left wrist    . Knee replace    . Total knee arthroplasty      2 times right  . Total shoulder replacement      2 times right shoulder  . Spinal cord stimulator implant  2011  . Hemorrhoid    . Knee arthroscopy    . Total knee arthroplasty  05/22/2012  Procedure: TOTAL KNEE ARTHROPLASTY;  Surgeon: Gearlean Alf, MD;  Location: [facility];  Service: Orthopedics;  Laterality: Left;  . Ulnar nerve transposition Left 06/13/2013    Procedure: DECOMPRESSION, POSSIBLE TRANSPOSITION LEFT ULNAR NERVE;  Surgeon: Wynonia Sours, MD;  Location: [facility];  Service: Orthopedics;  Laterality: Left;  . Hammer toe surgery       Current Outpatient Prescriptions  Medication Sig Dispense Refill  . estradiol (ESTRACE) 1 MG tablet Take 1 tablet (1 mg total) by mouth daily. 90 tablet 4  . Eszopiclone (ESZOPICLONE) 3 MG TABS Take 3 mg by mouth at bedtime. Take immediately before bedtime. Name Brand ONLY    . furosemide (LASIX) 20 MG tablet Take 1 tablet (20 mg total) by mouth daily. (Patient taking differently: Take 20 mg by mouth daily as needed. ) 30 tablet 3  . gabapentin (NEURONTIN) 100 MG capsule Take 300 mg by mouth 3 (three) times daily.    Marland Kitchen ibuprofen (ADVIL,MOTRIN) 600 MG tablet Take 600 mg by mouth as needed.    . methocarbamol  (ROBAXIN) 500 MG tablet Take 500 mg by mouth Every 6 hours.     . mometasone (NASONEX) 50 MCG/ACT nasal spray Place 2 sprays into the nose daily. 17 g 12  . morphine (MSIR) 30 MG tablet Take 60 mg by mouth every 6 (six) hours as needed (takes 3xdaily). pain    . polyethylene glycol (MIRALAX / GLYCOLAX) packet Take 17 g by mouth as needed. constipation    . potassium chloride (K-DUR,KLOR-CON) 10 MEQ tablet Take 10 mEq by mouth 2 (two) times daily as needed. With torsemide usage    . SYNTHROID 125 MCG tablet TAKE ONE TABLET BY MOUTH ONCE DAILY 30 tablet 0  . Tdap (BOOSTRIX) 5-2.5-18.5 LF-MCG/0.5 injection Inject 0.5 mLs into the muscle once. 0.5 mL 0  . tiZANidine (ZANAFLEX) 4 MG tablet Take 1 tablet (4 mg total) by mouth every 6 (six) hours as needed for muscle spasms. 90 tablet 2  . torsemide (DEMADEX) 5 MG tablet Take 1 tablet (5 mg total) by mouth daily. (Patient taking differently: Take 5 mg by mouth daily as needed. ) 30 tablet 3   No current facility-administered medications for this visit.    Allergies:   Cleocin; Amoxicillin-pot clavulanate; and Codeine    Social History:  The patient  reports that she quit smoking about 27 years ago. She has never used smokeless tobacco. She reports that she does not drink alcohol or use illicit drugs.   Family History:  The patient's family history includes Heart disease in her father, mother, and sister; Hyperlipidemia in her mother; Hypertension in her brother, father, mother, sister, and sister; Osteoarthritis in her brother, father, and sister; Pancreatic cancer in her mother; Rheum arthritis in her mother; Skin cancer in her father, mother, and sister; Stroke in her sister; Thyroid disease in her sister.    ROS:  Please see the history of present illness.   Otherwise, review of systems are positive for none.   All other systems are reviewed and negative.    PHYSICAL EXAM: VS:  BP 118/82 mmHg  Pulse 56  Ht 5' 6.75" (1.695 m)  Wt 58.06 kg (128  lb)  BMI 20.21 kg/m2 , BMI Body mass index is 20.21 kg/(m^2). GENERAL:  Well appearing HEENT:  Pupils equal round and reactive, fundi not visualized, oral mucosa unremarkable NECK:  No jugular venous distention, waveform within normal limits, carotid upstroke brisk and symmetric, no bruits, no thyromegaly  LYMPHATICS:  No cervical adenopathy LUNGS:  Clear to auscultation bilaterally HEART:  RRR.  PMI not displaced or sustained,S1 and S2 within normal limits, no S3, no S4, no clicks, no rubs, no murmurs ABD:  Flat, positive bowel sounds normal in frequency in pitch, no bruits, no rebound, no guarding, no midline pulsatile mass, no hepatomegaly, no splenomegaly EXT:  2 plus pulses throughout, no edema, no cyanosis no clubbing SKIN:  No rashes no nodules NEURO:  Cranial nerves II through XII grossly intact, motor grossly intact throughout PSYCH:  Cognitively intact, oriented to person place and time    EKG:  EKG is ordered today. The ekg ordered today demonstrates sinus bradycardia at 56 bpm.   Recent Labs: 01/16/2015: ALT 14; BUN 19; Creatinine, Ser 0.76; Hemoglobin 13.8; Platelets 152.0; Potassium 3.9; Sodium 138; TSH 2.33    Lipid Panel    Component Value Date/Time   CHOL 225* 01/16/2015 1125   TRIG 104.0 01/16/2015 1125   HDL 79.90 01/16/2015 1125   CHOLHDL 3 01/16/2015 1125   VLDL 20.8 01/16/2015 1125   LDLCALC 124* 01/16/2015 1125   LDLDIRECT 134.7 04/19/2013 0904      Wt Readings from Last 3 Encounters:  07/25/15 58.06 kg (128 lb)  01/16/15 61.462 kg (135 lb 8 oz)  03/14/14 62.596 kg (138 lb)      ASSESSMENT AND PLAN:  # Presurgical risk assessment: The patient does not have any unstable cardiac conditions.  Upon evaluation today, she can achieve 4 METs or greater without anginal symptoms.  According to [facility] and AHA guidelines, she requires no further cardiac workup prior to her noncardiac surgery and should be at acceptable risk.  her NSQIP risk of peri-procedural MI  or cardiac arrest is 0.17%.  Our service is available as necessary in the perioperative period.  The chest tightness and inability to take a deep breath seems to be a symptom of her kyphosis rather than ischemia.  She does not have exertional symptoms.   Current medicines are reviewed at length with the patient today.  The patient does not have concerns regarding medicines.  The following changes have been made:  no change  Labs/ tests ordered today include:  No orders of the defined types were placed in this encounter.     Disposition:   FU with Orlinda Slomski C. Oval Linsey, MD as needed.   Signed, Sharol Harness, MD  07/25/2015 11:36 AM    [place]

## 2015-07-25 ENCOUNTER — Ambulatory Visit (INDEPENDENT_AMBULATORY_CARE_PROVIDER_SITE_OTHER): Payer: Medicare Other | Admitting: Cardiovascular Disease

## 2015-07-25 VITALS — BP 118/82 | HR 56 | Ht 66.75 in | Wt 128.0 lb

## 2015-07-25 DIAGNOSIS — R0602 Shortness of breath: Secondary | ICD-10-CM | POA: Diagnosis not present

## 2015-07-25 DIAGNOSIS — Z01818 Encounter for other preprocedural examination: Secondary | ICD-10-CM

## 2015-07-25 NOTE — Patient Instructions (Signed)
Dr [place] recommends that you follow-up with her as needed. 

## 2015-07-26 ENCOUNTER — Encounter: Payer: Self-pay | Admitting: Cardiovascular Disease

## 2015-07-28 ENCOUNTER — Other Ambulatory Visit (INDEPENDENT_AMBULATORY_CARE_PROVIDER_SITE_OTHER): Payer: Medicare Other

## 2015-07-28 ENCOUNTER — Ambulatory Visit (INDEPENDENT_AMBULATORY_CARE_PROVIDER_SITE_OTHER): Payer: Medicare Other | Admitting: Internal Medicine

## 2015-07-28 ENCOUNTER — Encounter: Payer: Self-pay | Admitting: Internal Medicine

## 2015-07-28 ENCOUNTER — Ambulatory Visit (HOSPITAL_COMMUNITY)
Admission: RE | Admit: 2015-07-28 | Discharge: 2015-07-28 | Disposition: A | Discharge status: Home / Self Care | Payer: Medicare Other | Source: Ambulatory Visit | Attending: Orthopedic Surgery | Admitting: Orthopedic Surgery

## 2015-07-28 VITALS — BP 92/60 | HR 60 | Ht 66.75 in | Wt 129.6 lb

## 2015-07-28 DIAGNOSIS — M4185 Other forms of scoliosis, thoracolumbar region: Secondary | ICD-10-CM | POA: Diagnosis not present

## 2015-07-28 DIAGNOSIS — R0789 Other chest pain: Secondary | ICD-10-CM | POA: Insufficient documentation

## 2015-07-28 DIAGNOSIS — Z981 Arthrodesis status: Secondary | ICD-10-CM | POA: Diagnosis not present

## 2015-07-28 DIAGNOSIS — M2578 Osteophyte, vertebrae: Secondary | ICD-10-CM | POA: Diagnosis not present

## 2015-07-28 DIAGNOSIS — I700 Atherosclerosis of aorta: Secondary | ICD-10-CM | POA: Insufficient documentation

## 2015-07-28 DIAGNOSIS — M4014 Other secondary kyphosis, thoracic region: Secondary | ICD-10-CM

## 2015-07-28 DIAGNOSIS — M546 Pain in thoracic spine: Secondary | ICD-10-CM

## 2015-07-28 DIAGNOSIS — R0689 Other abnormalities of breathing: Secondary | ICD-10-CM

## 2015-07-28 DIAGNOSIS — R0600 Dyspnea, unspecified: Secondary | ICD-10-CM

## 2015-07-28 DIAGNOSIS — M4125 Other idiopathic scoliosis, thoracolumbar region: Secondary | ICD-10-CM | POA: Diagnosis not present

## 2015-07-28 DIAGNOSIS — R262 Difficulty in walking, not elsewhere classified: Secondary | ICD-10-CM

## 2015-07-28 LAB — CBC WITH DIFFERENTIAL/PLATELET
Basophils Absolute: 0 10*3/uL (ref 0.0–0.1)
Basophils Relative: 0.6 % (ref 0.0–3.0)
Eosinophils Absolute: 0.1 10*3/uL (ref 0.0–0.7)
Eosinophils Relative: 2 % (ref 0.0–5.0)
HCT: 37.6 % (ref 36.0–46.0)
Hemoglobin: 12.5 g/dL (ref 12.0–15.0)
Lymphocytes Relative: 37.9 % (ref 12.0–46.0)
Lymphs Abs: 1.7 10*3/uL (ref 0.7–4.0)
MCHC: 33.3 g/dL (ref 30.0–36.0)
MCV: 93.6 fl (ref 78.0–100.0)
Monocytes Absolute: 0.4 10*3/uL (ref 0.1–1.0)
Monocytes Relative: 9.4 % (ref 3.0–12.0)
Neutro Abs: 2.2 10*3/uL (ref 1.4–7.7)
Neutrophils Relative %: 50.1 % (ref 43.0–77.0)
Platelets: 147 10*3/uL — ABNORMAL LOW (ref 150.0–400.0)
RBC: 4.02 Mil/uL (ref 3.87–5.11)
RDW: 13 % (ref 11.5–15.5)
WBC: 4.4 10*3/uL (ref 4.0–10.5)

## 2015-07-28 LAB — BASIC METABOLIC PANEL
BUN: 23 mg/dL (ref 6–23)
CO2: 35 mEq/L — ABNORMAL HIGH (ref 19–32)
Calcium: 9.3 mg/dL (ref 8.4–10.5)
Chloride: 101 mEq/L (ref 96–112)
Creatinine, Ser: 0.86 mg/dL (ref 0.40–1.20)
GFR: 69.8 mL/min (ref >60.00)
Glucose, Bld: 96 mg/dL (ref 70–99)
Potassium: 3.9 mEq/L (ref 3.5–5.1)
Sodium: 139 mEq/L (ref 135–145)

## 2015-07-28 LAB — TSH: TSH: 2.57 u[IU]/mL (ref 0.35–4.50)

## 2015-07-28 NOTE — Patient Instructions (Signed)
Your lung volumes and flows are quite good and do not explain your level of breathing discomfort  Please see patient coordinator before you leave today  to schedule CTangiogram  Please remember to go to the lab department downstairs for your tests - we will call you with the results when they are available.

## 2015-07-28 NOTE — Progress Notes (Signed)
Subjective:    Patient ID: Sonya Cordova, female    DOB: Jul 07, 1946,    MRN: [id number]  HPI  30 yowf quit smoking 1989 no trouble at all  referred to pulmonary clinic 07/28/2015 by Dr Marykay Lex in [place] for preop pulmonary eval for spine surgery in setting of progressive sob x 2 years attributed to scoliosis/ kyphsosis   07/28/2015 1st [facility] office visit/ Jenilee Franey   Chief Complaint  Patient presents with  . Pulmonary Consult    Referred by Dr. Rowe Pavy. Pt needing pulmonary clearance foe back surgery. Pt c/o SOB for the past 2 yrs "feels like I'm in a bear hug all of the time".  symptoms sob  are persistent daily x 2y and  worse first thing in am with sense can't get a deep breath when gets out out of bed but doesn't disturb her sleep. Able to do HT but it's a struggle and uses HC parking with sensation of bear hugger above the bra line perfect circle around chest and antipates the 18th spine surgery she plans in [place] will correct this sensation   No obvious day to day or daytime variability or assoc chronic cough or cp or chest tightness, subjective wheeze or overt sinus or hb symptoms. No unusual exp hx or h/o childhood pna/ asthma or knowledge of premature birth.  Sleeping ok without nocturnal  or early am exacerbation  of respiratory  c/o's or need for noct saba. Also denies any obvious fluctuation of symptoms with weather or environmental changes or other aggravating or alleviating factors except as outlined above   Current Medications, Allergies, Complete Past Medical History, Past Surgical History, Family History, and Social History were reviewed in [employer] record.  ROS  The following are not active complaints unless bolded sore throat, dysphagia, dental problems, itching, sneezing,  nasal congestion or excess/ purulent secretions, ear ache,   fever, chills, sweats, unintended wt loss, classically pleuritic or exertional cp, hemoptysis,   orthopnea pnd or leg swelling, presyncope, palpitations, abdominal pain, anorexia, nausea, vomiting, diarrhea  or change in bowel or bladder habits, change in stools or urine, dysuria,hematuria,  rash, arthralgias, visual complaints, headache, numbness, weakness or ataxia or problems with walking or coordination,  change in mood/affect or memory.        Review of Systems  Constitutional: Positive for unexpected weight change. Negative for fever and chills.  HENT: Positive for congestion and dental problem. Negative for ear pain, nosebleeds, postnasal drip, rhinorrhea, sinus pressure, sneezing, sore throat, trouble swallowing and voice change.   Eyes: Negative for visual disturbance.  Respiratory: Positive for shortness of breath. Negative for cough and choking.   Cardiovascular: Positive for chest pain.  Gastrointestinal: Negative for vomiting, abdominal pain and diarrhea.  Genitourinary: Negative for difficulty urinating.  Musculoskeletal: Positive for arthralgias.  Skin: Negative for rash.  Neurological: Negative for tremors, syncope and headaches.  Hematological: Does not bruise/bleed easily.       Objective:   Physical Exam  amb thin wf nad  Wt Readings from Last 3 Encounters:  07/28/15 129 lb 9.6 oz (58.786 kg)  07/25/15 128 lb (58.06 kg)  01/16/15 135 lb 8 oz (61.462 kg)    Vital signs reviewed   HEENT: nl dentition, turbinates, and orophanx. Nl external ear canals without cough reflex   NECK :  without JVD/Nodes/TM/ nl carotid upstrokes bilaterally   LUNGS: no acc muscle use, clear to A and P bilaterally without cough on insp or exp  maneuvers   CV:  RRR  no s3 or murmur or increase in P2, no edema   ABD:  soft and nontender with nl excursion in the supine position. No bruits or organomegaly, bowel sounds nl  MS:  warm without deformities, calf tenderness, cyanosis or clubbing  SKIN: warm and dry without lesions    NEURO:  alert, approp, no deficits       Labs ordered/ reviewed:      Chemistry      Component Value Date/Time   NA 139 07/28/2015 1619   K 3.9 07/28/2015 1619   CL 101 07/28/2015 1619   CO2 35* 07/28/2015 1619   BUN 23 07/28/2015 1619   CREATININE 0.86 07/28/2015 1619      Component Value Date/Time   CALCIUM 9.3 07/28/2015 1619   ALKPHOS 49 01/16/2015 1125   AST 23 01/16/2015 1125   ALT 14 01/16/2015 1125   BILITOT 0.6 01/16/2015 1125        Lab Results  Component Value Date   WBC 4.4 07/28/2015   HGB 12.5 07/28/2015   HCT 37.6 07/28/2015   MCV 93.6 07/28/2015   PLT 147.0* 07/28/2015         Lab Results  Component Value Date   TSH 2.57 07/28/2015        Images ordered 07/28/2015  CTa Chest        Assessment & Plan:

## 2015-07-28 NOTE — Assessment & Plan Note (Addendum)
-   Spirometry 07/28/2015 min airflow obst/ nl lung volumes - 07/28/2015   Walked RA x one lap @ 185 stopped due to  Sob/ chest tight, desats to 86%   With normal lung volumes with heart understand how her kyphoscoliosis is both making her short of breath and causing desaturation. I recommended therefore proceeding directly to a CT angiogram of the chest to sort this out.  Discussed in detail all the  indications, usual  risks and alternatives  relative to the benefits with patient who agrees to proceed with  W/u as outlined.  I also cautioned that with nl lung volumes her hope of correcting all of her symptoms with surgery might not be entirely realistic but doubt we will find anything that will rule her out as a surgical candidate if Dr. Marykay Lex feels he can help her. The main concern would be postop atelectasis but given the fact that she has such good Residual lung flows status post remote smoking cessation I don't believe she'll have trouble with cough mechanics.  This having been said, she tells me that her toughest time is during the morning after she "first stirs until she gets a good deep breath" and might therefore benefit from   both preop and postop incentive spirometry as well as early and often mobilization post op.   Total time devoted to counseling  = 33m/60 m ov review case with pt/ discussion of options/alternatives/ giving and going over instructions (see avs)

## 2015-07-28 NOTE — Assessment & Plan Note (Signed)
HC03 35 07/28/2015   This remains unexplained based on exam and spirometry but is not per se a contraindication for surgery.  Most likely  this is a well compensated hypercarbia with pC02's mid 50's which will need to be noted for post op concerns with bipap prn if needed for increased work of breathing or pc02 above 60 as a bridge until she is fully mobile and less narcotics needed post op.

## 2015-07-29 ENCOUNTER — Telehealth: Payer: Self-pay | Admitting: Internal Medicine

## 2015-07-29 ENCOUNTER — Ambulatory Visit (INDEPENDENT_AMBULATORY_CARE_PROVIDER_SITE_OTHER)
Admission: RE | Admit: 2015-07-29 | Discharge: 2015-07-29 | Disposition: A | Discharge status: Home / Self Care | Payer: Medicare Other | Source: Ambulatory Visit | Attending: Internal Medicine | Admitting: Internal Medicine

## 2015-07-29 DIAGNOSIS — R0600 Dyspnea, unspecified: Secondary | ICD-10-CM

## 2015-07-29 DIAGNOSIS — M25562 Pain in left knee: Secondary | ICD-10-CM | POA: Diagnosis not present

## 2015-07-29 DIAGNOSIS — R0902 Hypoxemia: Secondary | ICD-10-CM | POA: Diagnosis not present

## 2015-07-29 MED ORDER — IOHEXOL 350 MG/ML SOLN
80.0000 mL | Freq: Once | INTRAVENOUS | Status: AC | PRN
  Administered 2015-07-29: 80 mL via INTRAVENOUS

## 2015-07-29 NOTE — Telephone Encounter (Signed)
CTA is in epic. Please advise MW thanks

## 2015-07-29 NOTE — Telephone Encounter (Signed)
Pt aware of results.  Nothing further needed.  

## 2015-07-29 NOTE — Telephone Encounter (Signed)
See result note.  

## 2015-07-30 DIAGNOSIS — M25562 Pain in left knee: Secondary | ICD-10-CM | POA: Diagnosis not present

## 2015-08-05 DIAGNOSIS — Z79891 Long term (current) use of opiate analgesic: Secondary | ICD-10-CM | POA: Diagnosis not present

## 2015-08-05 DIAGNOSIS — G4700 Insomnia, unspecified: Secondary | ICD-10-CM | POA: Diagnosis not present

## 2015-08-05 DIAGNOSIS — M25562 Pain in left knee: Secondary | ICD-10-CM | POA: Diagnosis not present

## 2015-08-05 DIAGNOSIS — G894 Chronic pain syndrome: Secondary | ICD-10-CM | POA: Diagnosis not present

## 2015-08-05 DIAGNOSIS — M961 Postlaminectomy syndrome, not elsewhere classified: Secondary | ICD-10-CM | POA: Diagnosis not present

## 2015-08-05 NOTE — Addendum Note (Signed)
Addended by: Fidel Levy on: 08/05/2015 05:05 PM   Modules accepted: Orders

## 2015-08-07 ENCOUNTER — Other Ambulatory Visit: Payer: Self-pay | Admitting: Surgical

## 2015-08-07 DIAGNOSIS — M25562 Pain in left knee: Secondary | ICD-10-CM | POA: Diagnosis not present

## 2015-08-07 MED ORDER — SYNTHROID 125 MCG PO TABS: 125.0000 ug | ORAL_TABLET | Freq: Every day | ORAL | Status: DC

## 2015-08-09 ENCOUNTER — Other Ambulatory Visit: Payer: Self-pay | Admitting: Orthopedic Surgery

## 2015-08-09 DIAGNOSIS — M4125 Other idiopathic scoliosis, thoracolumbar region: Secondary | ICD-10-CM

## 2015-08-09 DIAGNOSIS — M4014 Other secondary kyphosis, thoracic region: Secondary | ICD-10-CM

## 2015-08-09 DIAGNOSIS — M546 Pain in thoracic spine: Secondary | ICD-10-CM

## 2015-08-09 DIAGNOSIS — R262 Difficulty in walking, not elsewhere classified: Secondary | ICD-10-CM

## 2015-08-13 DIAGNOSIS — M25562 Pain in left knee: Secondary | ICD-10-CM | POA: Diagnosis not present

## 2015-08-15 DIAGNOSIS — M25562 Pain in left knee: Secondary | ICD-10-CM | POA: Diagnosis not present

## 2015-08-21 DIAGNOSIS — R262 Difficulty in walking, not elsewhere classified: Secondary | ICD-10-CM | POA: Diagnosis not present

## 2015-08-21 DIAGNOSIS — M546 Pain in thoracic spine: Secondary | ICD-10-CM | POA: Diagnosis not present

## 2015-08-21 DIAGNOSIS — M4125 Other idiopathic scoliosis, thoracolumbar region: Secondary | ICD-10-CM | POA: Diagnosis not present

## 2015-08-21 DIAGNOSIS — M4014 Other secondary kyphosis, thoracic region: Secondary | ICD-10-CM | POA: Diagnosis not present

## 2015-08-22 ENCOUNTER — Ambulatory Visit (INDEPENDENT_AMBULATORY_CARE_PROVIDER_SITE_OTHER): Payer: Medicare Other | Admitting: Internal Medicine

## 2015-08-22 ENCOUNTER — Encounter: Payer: Self-pay | Admitting: Internal Medicine

## 2015-08-22 VITALS — BP 102/60 | HR 57 | Temp 97.7°F | Wt 127.0 lb

## 2015-08-22 DIAGNOSIS — R32 Unspecified urinary incontinence: Secondary | ICD-10-CM

## 2015-08-22 DIAGNOSIS — J322 Chronic ethmoidal sinusitis: Secondary | ICD-10-CM | POA: Diagnosis not present

## 2015-08-22 DIAGNOSIS — M25562 Pain in left knee: Secondary | ICD-10-CM | POA: Diagnosis not present

## 2015-08-22 DIAGNOSIS — J0191 Acute recurrent sinusitis, unspecified: Secondary | ICD-10-CM | POA: Insufficient documentation

## 2015-08-22 DIAGNOSIS — N3642 Intrinsic sphincter deficiency (ISD): Secondary | ICD-10-CM

## 2015-08-22 MED ORDER — LEVOFLOXACIN 500 MG PO TABS: 500.0000 mg | ORAL_TABLET | Freq: Every day | ORAL | Status: DC

## 2015-08-22 MED ORDER — TETANUS-DIPHTH-ACELL PERTUSSIS 5-2.5-18.5 LF-MCG/0.5 IM SUSP: 0.5000 mL | Freq: Once | INTRAMUSCULAR | Status: DC

## 2015-08-22 MED ORDER — PREDNISONE 10 MG PO TABS: ORAL_TABLET | ORAL | Status: DC

## 2015-08-22 MED ORDER — ESTROGENS, CONJUGATED 0.625 MG/GM VA CREA: 1.0000 | TOPICAL_CREAM | Freq: Every day | VAGINAL | Status: DC

## 2015-08-22 NOTE — Progress Notes (Signed)
Subjective:  Patient ID: Sonya Cordova, female    DOB: 05-01-1946  Age: 69 y.o. MRN: [id number]  CC: The primary encounter diagnosis was Chronic ethmoidal sinusitis. A diagnosis of Urinary incontinence due to urethral sphincter incompetence was also pertinent to this visit.  HPI Sonya Cordova presents for sinus congestion for the past 2 months.  Discharge  Was green initially now clear,  .  Using decongestants daily OTC  (PE 5 mg every 4 hours ) which is aggravating her constipation.  Se feels pain and pressure in all sinuses , ears feel ear.  eyes itch,  No sneezing,  No cough   Using flonase bid.   No new plants or pets.   Changes filters regulalry,  No recent travel,  No use of pool   2) Back pain:  She is having back surgery jan 17th by Dr Rowe Pavy who plants to do  removal of rods and place one extensive rod up to T2    3) Has been having urgency and occasional  incontinence that has been more problematic lately. Waking her up at night.  Outpatient Prescriptions Prior to Visit  Medication Sig Dispense Refill  . estradiol (ESTRACE) 1 MG tablet Take 1 tablet (1 mg total) by mouth daily. 90 tablet 4  . Eszopiclone (ESZOPICLONE) 3 MG TABS Take 3 mg by mouth at bedtime. Take immediately before bedtime. Name Brand ONLY    . gabapentin (NEURONTIN) 100 MG capsule Take 300 mg by mouth 3 (three) times daily.    Marland Kitchen ibuprofen (ADVIL,MOTRIN) 600 MG tablet Take 600 mg by mouth as needed.    . methocarbamol (ROBAXIN) 500 MG tablet Take 500 mg by mouth Every 6 hours.     . mometasone (NASONEX) 50 MCG/ACT nasal spray Place 2 sprays into the nose daily. 17 g 12  . morphine (MSIR) 30 MG tablet Take 60 mg by mouth every 6 (six) hours as needed (takes 3xdaily). pain    . Multiple Vitamins-Minerals (MULTIVITAMIN WITH MINERALS) tablet Take 1 tablet by mouth daily.    . naloxegol oxalate (MOVANTIK) 25 MG TABS tablet Take 25 mg by mouth daily as needed.    . polyethylene glycol (MIRALAX / GLYCOLAX)  packet Take 17 g by mouth as needed. constipation    . potassium chloride (K-DUR,KLOR-CON) 10 MEQ tablet Take 10 mEq by mouth 2 (two) times daily as needed. With torsemide usage    . SYNTHROID 125 MCG tablet Take 1 tablet (125 mcg total) by mouth daily. 30 tablet 2  . tiZANidine (ZANAFLEX) 4 MG tablet Take 1 tablet (4 mg total) by mouth every 6 (six) hours as needed for muscle spasms. 90 tablet 2  . torsemide (DEMADEX) 5 MG tablet Take 5 mg by mouth daily as needed.     No facility-administered medications prior to visit.    Review of Systems;  Patient denies headache, fevers, malaise, unintentional weight loss, skin rash, eye pain, sinus congestion and sinus pain, sore throat, dysphagia,  hemoptysis , cough, dyspnea, wheezing, chest pain, palpitations, orthopnea, edema, abdominal pain, nausea, melena, diarrhea, constipation, flank pain, dysuria, hematuria, urinary  Frequency, nocturia, numbness, tingling, seizures,  Focal weakness, Loss of consciousness,  Tremor, insomnia, depression, anxiety, and suicidal ideation.      Objective:  BP 102/60 mmHg  Pulse 57  Temp(Src) 97.7 F (36.5 C) (Oral)  Wt 127 lb (57.607 kg)  SpO2 97%  BP Readings from Last 3 Encounters:  08/22/15 102/60  07/28/15 92/60  07/25/15 118/82  Wt Readings from Last 3 Encounters:  08/22/15 127 lb (57.607 kg)  07/28/15 129 lb 9.6 oz (58.786 kg)  07/25/15 128 lb (58.06 kg)    General appearance: alert, cooperative and appears stated age Ears: normal TM's and external ear canals both ears Throat: lips, mucosa, and tongue normal; teeth and gums normal Neck: no adenopathy, no carotid bruit, supple, symmetrical, trachea midline and thyroid not enlarged, symmetric, no tenderness/mass/nodules Back: symmetric, no curvature. ROM normal. No CVA tenderness. Lungs: clear to auscultation bilaterally Heart: regular rate and rhythm, S1, S2 normal, no murmur, click, rub or gallop Abdomen: soft, non-tender; bowel sounds  normal; no masses,  no organomegaly Pulses: 2+ and symmetric Skin: Skin color, texture, turgor normal. No rashes or lesions Lymph nodes: Cervical, supraclavicular, and axillary nodes normal.  No results found for: HGBA1C  Lab Results  Component Value Date   CREATININE 0.86 07/28/2015   CREATININE 0.76 01/16/2015   CREATININE 0.7 05/28/2014    Lab Results  Component Value Date   WBC 4.4 07/28/2015   HGB 12.5 07/28/2015   HCT 37.6 07/28/2015   PLT 147.0* 07/28/2015   GLUCOSE 96 07/28/2015   CHOL 225* 01/16/2015   TRIG 104.0 01/16/2015   HDL 79.90 01/16/2015   LDLDIRECT 134.7 04/19/2013   LDLCALC 124* 01/16/2015   ALT 14 01/16/2015   AST 23 01/16/2015   NA 139 07/28/2015   K 3.9 07/28/2015   CL 101 07/28/2015   CREATININE 0.86 07/28/2015   BUN 23 07/28/2015   CO2 35* 07/28/2015   TSH 2.57 07/28/2015   INR 0.85 05/16/2012   MICROALBUR 0.3 11/01/2012    Ct Angio Chest Pe W/cm &/or Wo Cm  07/29/2015  CLINICAL DATA:  Decreased O2 sats in office, abnormal pulmonary function tests. Evaluate for pulmonary embolus. EXAM: CT ANGIOGRAPHY CHEST WITH CONTRAST TECHNIQUE: Multidetector CT imaging of the chest was performed using the standard protocol during bolus administration of intravenous contrast. Multiplanar CT image reconstructions and MIPs were obtained to evaluate the vascular anatomy. CONTRAST:  8mL OMNIPAQUE IOHEXOL 350 MG/ML SOLN COMPARISON:  None. FINDINGS: Mediastinum/Nodes: Negative for pulmonary embolus. Right thyroidectomy. No pathologically enlarged mediastinal, hilar or axillary lymph nodes. Heart size normal. No pericardial effusion. Lungs/Pleura: Biapical pleural parenchymal scarring. Scattered subpleural scarring or atelectasis bilaterally. No pleural fluid. Airway is unremarkable. Upper abdomen: Left hepatic lobe margin appears irregular but this may be due in part to the upper abdominal wall and hemidiaphragm. Trace perihepatic ascites. Visualized portions of the  adrenal glands, kidneys, spleen, pancreas and stomach are grossly unremarkable. Musculoskeletal: Postoperative changes are seen in the spine. No worrisome lytic or sclerotic lesions. Right shoulder arthroplasty. Review of the MIP images confirms the above findings. IMPRESSION: 1. Negative for pulmonary embolus. No additional findings to explain the patient's given history. 2. Left hepatic lobe irregularity may be anatomical. Difficult to exclude cirrhosis. Please correlate clinically. Electronically Signed   By: Lorin Picket M.D.   On: 07/29/2015 09:07    Assessment & Plan:   Problem List Items Addressed This Visit    Sinusitis, chronic - Primary    Given chronicity of symptoms, development of facial pain and exam consistent with bacterial URI,  Will treat with empiric antibiotics, decongestants, and saline lavage.         Relevant Medications   levofloxacin (LEVAQUIN) 500 MG tablet   predniSONE (DELTASONE) 10 MG tablet   Urinary incontinence due to urethral sphincter incompetence    Trial of estrogen vaginal cream discussed.  A total of 25 minutes of face to face time was spent with patient more than half of which was spent in counselling about the above mentioned conditions  and coordination of care   I am having Ms. Worthey start on levofloxacin, predniSONE, conjugated estrogens, and Tdap. I am also having her maintain her morphine, gabapentin, polyethylene glycol, potassium chloride, methocarbamol, Eszopiclone, tiZANidine, mometasone, estradiol, ibuprofen, torsemide, multivitamin with minerals, naloxegol oxalate, SYNTHROID, and Calcium Polycarbophil (FIBER-CAPS PO).  Meds ordered this encounter  Medications  . Calcium Polycarbophil (FIBER-CAPS PO)    Sig: Take by mouth.  . levofloxacin (LEVAQUIN) 500 MG tablet    Sig: Take 1 tablet (500 mg total) by mouth daily.    Dispense:  7 tablet    Refill:  0  . predniSONE (DELTASONE) 10 MG tablet    Sig: 6 tablets on Day 1 , then  reduce by 1 tablet daily until gone    Dispense:  21 tablet    Refill:  0  . conjugated estrogens (PREMARIN) vaginal cream    Sig: Place 1 Applicatorful vaginally daily. For two weeks,  The reduce use to twice weekly    Dispense:  42.5 g    Refill:  12  . Tdap (BOOSTRIX) 5-2.5-18.5 LF-MCG/0.5 injection    Sig: Inject 0.5 mLs into the muscle once.    Dispense:  0.5 mL    Refill:  0    There are no discontinued medications.  Follow-up: No Follow-up on file.   Crecencio Mc, MD

## 2015-08-22 NOTE — Patient Instructions (Addendum)
I am treating you for sinusitis/otitis which is a complication from your viral infection due to  persistent sinus congestion.   I am prescribing an antibiotic (levaquin) and a prednisone taper  To manage the infection and the inflammation in your ear/sinuses.   I also advise use of the following OTC meds to help with your other symptoms.   flush your sinuses twice daily with Milta Deiters Meds Sinus Rinse rimply Saline (do over the sink because f you do it right you will spit out globs of mucus)   Please take a probiotic ( Align, Floraque or Culturelle) of the generic version of one of these  For a minimum of 3 weeks to prevent a serious antibiotic associated diarrhea  Called clostridium dificile colitis     Trial of estrogen vaginal cream to see if it helps the urinary incontinence

## 2015-08-24 DIAGNOSIS — N3642 Intrinsic sphincter deficiency (ISD): Secondary | ICD-10-CM | POA: Insufficient documentation

## 2015-08-24 DIAGNOSIS — R32 Unspecified urinary incontinence: Secondary | ICD-10-CM

## 2015-08-24 NOTE — Assessment & Plan Note (Signed)
Trial of estrogen vaginal cream discussed.

## 2015-08-24 NOTE — Assessment & Plan Note (Signed)
Given chronicity of symptoms, development of facial pain and exam consistent with bacterial URI,  Will treat with empiric antibiotics, decongestants, and saline lavage.   

## 2015-09-02 ENCOUNTER — Telehealth: Payer: Self-pay

## 2015-09-02 DIAGNOSIS — J322 Chronic ethmoidal sinusitis: Secondary | ICD-10-CM

## 2015-09-02 DIAGNOSIS — M25562 Pain in left knee: Secondary | ICD-10-CM | POA: Diagnosis not present

## 2015-09-02 NOTE — Addendum Note (Signed)
Addended by: Crecencio Mc on: 09/02/2015 09:39 PM   Modules accepted: Orders

## 2015-09-02 NOTE — Telephone Encounter (Signed)
Referral is in process as requested 

## 2015-09-02 NOTE — Telephone Encounter (Signed)
Pt called stating she is not feeling any better she still has some green mucus and some coughing. I explained to hr that the cough can go on for several weeks.

## 2015-09-02 NOTE — Telephone Encounter (Signed)
She was treated for a chronic sinus infection with abx and prednisone.  She will need to see ENT to have her sinuses scanned.  Does she have a preference.

## 2015-09-02 NOTE — Telephone Encounter (Signed)
Patient prefers an ENT in [place] someone preferably in with [facility].

## 2015-09-03 ENCOUNTER — Telehealth: Payer: Self-pay | Admitting: Internal Medicine

## 2015-09-03 ENCOUNTER — Encounter: Payer: Self-pay | Admitting: Internal Medicine

## 2015-09-03 DIAGNOSIS — Z01818 Encounter for other preprocedural examination: Secondary | ICD-10-CM

## 2015-09-03 NOTE — Telephone Encounter (Signed)
Dr Marykay Lex has requested preop clearance for her spine surgery,  Which will include chest x ray, ekg and labs.   The labs from 10/24 do not suffice,  Can she return for cmet, CMP.  And EKG, and I will order the CXR as well

## 2015-09-04 DIAGNOSIS — M25562 Pain in left knee: Secondary | ICD-10-CM | POA: Diagnosis not present

## 2015-09-04 NOTE — Telephone Encounter (Signed)
Patient appointment scheduled

## 2015-09-08 DIAGNOSIS — M25562 Pain in left knee: Secondary | ICD-10-CM | POA: Diagnosis not present

## 2015-09-09 DIAGNOSIS — M25562 Pain in left knee: Secondary | ICD-10-CM | POA: Diagnosis not present

## 2015-09-11 ENCOUNTER — Ambulatory Visit (HOSPITAL_COMMUNITY)
Admission: RE | Admit: 2015-09-11 | Discharge: 2015-09-11 | Disposition: A | Discharge status: Home / Self Care | Payer: Medicare Other | Source: Ambulatory Visit | Attending: Orthopedic Surgery | Admitting: Orthopedic Surgery

## 2015-09-11 DIAGNOSIS — R262 Difficulty in walking, not elsewhere classified: Secondary | ICD-10-CM

## 2015-09-11 DIAGNOSIS — I700 Atherosclerosis of aorta: Secondary | ICD-10-CM | POA: Insufficient documentation

## 2015-09-11 DIAGNOSIS — M4014 Other secondary kyphosis, thoracic region: Secondary | ICD-10-CM

## 2015-09-11 DIAGNOSIS — M546 Pain in thoracic spine: Secondary | ICD-10-CM

## 2015-09-11 DIAGNOSIS — M4125 Other idiopathic scoliosis, thoracolumbar region: Secondary | ICD-10-CM | POA: Diagnosis not present

## 2015-09-11 DIAGNOSIS — M419 Scoliosis, unspecified: Secondary | ICD-10-CM | POA: Diagnosis not present

## 2015-09-15 DIAGNOSIS — J0100 Acute maxillary sinusitis, unspecified: Secondary | ICD-10-CM | POA: Diagnosis not present

## 2015-09-17 ENCOUNTER — Telehealth: Payer: Self-pay | Admitting: Internal Medicine

## 2015-09-17 ENCOUNTER — Ambulatory Visit: Payer: Medicare Other | Admitting: Podiatry

## 2015-09-17 NOTE — Telephone Encounter (Signed)
Patient stated that she cannot take the generic synthroid it does not work for her does not control her body temperature. Patient stated that the generic has never worked that she tried first with Dr. Doreene Nest.

## 2015-09-17 NOTE — Telephone Encounter (Signed)
Ok, then we need to do a prior auth for her Synthroid

## 2015-09-24 ENCOUNTER — Encounter: Payer: Self-pay | Admitting: Internal Medicine

## 2015-09-24 ENCOUNTER — Ambulatory Visit (INDEPENDENT_AMBULATORY_CARE_PROVIDER_SITE_OTHER): Payer: Medicare Other | Admitting: Internal Medicine

## 2015-09-24 VITALS — BP 120/64 | HR 64 | Temp 97.8°F | Resp 12 | Ht 67.0 in | Wt 129.5 lb

## 2015-09-24 DIAGNOSIS — Z01818 Encounter for other preprocedural examination: Secondary | ICD-10-CM

## 2015-09-24 DIAGNOSIS — M8580 Other specified disorders of bone density and structure, unspecified site: Secondary | ICD-10-CM

## 2015-09-24 DIAGNOSIS — E559 Vitamin D deficiency, unspecified: Secondary | ICD-10-CM

## 2015-09-24 DIAGNOSIS — J329 Chronic sinusitis, unspecified: Secondary | ICD-10-CM

## 2015-09-24 DIAGNOSIS — F411 Generalized anxiety disorder: Secondary | ICD-10-CM | POA: Diagnosis not present

## 2015-09-24 DIAGNOSIS — E2839 Other primary ovarian failure: Secondary | ICD-10-CM | POA: Diagnosis not present

## 2015-09-24 MED ORDER — DIAZEPAM 5 MG PO TABS: 5.0000 mg | ORAL_TABLET | Freq: Two times a day (BID) | ORAL | Status: DC | PRN

## 2015-09-24 NOTE — Progress Notes (Signed)
Pre-visit discussion using our clinic review tool. No additional management support is needed unless otherwise documented below in the visit note.  

## 2015-09-24 NOTE — Patient Instructions (Signed)
Return in early January for nonfasting labs .  Start taking 1000 IUs of Vit D3,  And 600 mg of calcium supplement,  Get the rest (1200 mg total daily needed) through diet  Valium as needed for pre op anxiety.  Start with 1/2 tablet

## 2015-09-24 NOTE — Progress Notes (Signed)
Subjective:  Patient ID: Sonya Cordova, female    DOB: 05/21/1946  Age: 69 y.o. MRN: [id number]  CC: The primary encounter diagnosis was Anxiety state. Diagnoses of Osteopenia, Estrogen deficiency, Vitamin D deficiency, Preoperative clearance, and Chronic sinusitis, unspecified location were also pertinent to this visit.  HPI Sonya Cordova presents for surgical clearance for back surgery Jan 17th.  Has alrelady had pulmonary and cardiology clearance,  .   Recent episode of chronic sinusitis treated by me ;  treatment was extended by ENT for 3 more weeks when she present ed with severe headache and sinus infection,  Has 11 days left of antibiotics .still taking a probiotic . Symtpmso have improved.  She has no history of DM, CKD or CAD and has had no recent history of chest pain or palpitations..     Discussed medicaiton for anxiety resulting for apprehension about her upcoming surgery.      Outpatient Prescriptions Prior to Visit  Medication Sig Dispense Refill  . Calcium Polycarbophil (FIBER-CAPS PO) Take by mouth.    . estradiol (ESTRACE) 1 MG tablet Take 1 tablet (1 mg total) by mouth daily. 90 tablet 4  . Eszopiclone (ESZOPICLONE) 3 MG TABS Take 3 mg by mouth at bedtime. Take immediately before bedtime. Name Brand ONLY    . gabapentin (NEURONTIN) 100 MG capsule Take 300 mg by mouth 3 (three) times daily.    Marland Kitchen ibuprofen (ADVIL,MOTRIN) 600 MG tablet Take 600 mg by mouth as needed.    Marland Kitchen levofloxacin (LEVAQUIN) 500 MG tablet Take 1 tablet (500 mg total) by mouth daily. 7 tablet 0  . methocarbamol (ROBAXIN) 500 MG tablet Take 500 mg by mouth Every 6 hours.     Marland Kitchen morphine (MSIR) 30 MG tablet Take 60 mg by mouth every 6 (six) hours as needed (takes 3xdaily). pain    . Multiple Vitamins-Minerals (MULTIVITAMIN WITH MINERALS) tablet Take 1 tablet by mouth daily.    . naloxegol oxalate (MOVANTIK) 25 MG TABS tablet Take 25 mg by mouth daily as needed.    . polyethylene glycol (MIRALAX  / GLYCOLAX) packet Take 17 g by mouth as needed. constipation    . potassium chloride (K-DUR,KLOR-CON) 10 MEQ tablet Take 10 mEq by mouth 2 (two) times daily as needed. With torsemide usage    . predniSONE (DELTASONE) 10 MG tablet 6 tablets on Day 1 , then reduce by 1 tablet daily until gone 21 tablet 0  . SYNTHROID 125 MCG tablet Take 1 tablet (125 mcg total) by mouth daily. 30 tablet 2  . Tdap (BOOSTRIX) 5-2.5-18.5 LF-MCG/0.5 injection Inject 0.5 mLs into the muscle once. 0.5 mL 0  . tiZANidine (ZANAFLEX) 4 MG tablet Take 1 tablet (4 mg total) by mouth every 6 (six) hours as needed for muscle spasms. 90 tablet 2  . torsemide (DEMADEX) 5 MG tablet Take 5 mg by mouth daily as needed.    . conjugated estrogens (PREMARIN) vaginal cream Place 1 Applicatorful vaginally daily. For two weeks,  The reduce use to twice weekly 42.5 g 12  . mometasone (NASONEX) 50 MCG/ACT nasal spray Place 2 sprays into the nose daily. (Patient not taking: Reported on 09/24/2015) 17 g 12   No facility-administered medications prior to visit.    Review of Systems;  Patient denies headache, fevers, malaise, unintentional weight loss, skin rash, eye pain, sinus congestion and sinus pain, sore throat, dysphagia,  hemoptysis , cough, dyspnea, wheezing, chest pain, palpitations, orthopnea, edema, abdominal pain, nausea, melena, diarrhea, constipation,  flank pain, dysuria, hematuria, urinary  Frequency, nocturia, numbness, tingling, seizures,  Focal weakness, Loss of consciousness,  Tremor, insomnia, depression, anxiety, and suicidal ideation.      Objective:  BP 120/64 mmHg  Pulse 64  Temp(Src) 97.8 F (36.6 C) (Oral)  Resp 12  Ht 5\' 7"  (1.702 m)  Wt 129 lb 8 oz (58.741 kg)  BMI 20.28 kg/m2  SpO2 98%  BP Readings from Last 3 Encounters:  09/25/15 103/60  09/24/15 120/64  08/22/15 102/60    Wt Readings from Last 3 Encounters:  09/24/15 129 lb 8 oz (58.741 kg)  08/22/15 127 lb (57.607 kg)  07/28/15 129 lb 9.6 oz  (58.786 kg)    General appearance: alert, cooperative and appears stated age Ears: normal TM's and external ear canals both ears Throat: lips, mucosa, and tongue normal; teeth and gums normal Neck: no adenopathy, no carotid bruit, supple, symmetrical, trachea midline and thyroid not enlarged, symmetric, no tenderness/mass/nodules Back: symmetric, no curvature. ROM normal. No CVA tenderness. Lungs: clear to auscultation bilaterally Heart: regular rate and rhythm, S1, S2 normal, no murmur, click, rub or gallop Abdomen: soft, non-tender; bowel sounds normal; no masses,  no organomegaly Pulses: 2+ and symmetric Skin: Skin color, texture, turgor normal. No rashes or lesions Lymph nodes: Cervical, supraclavicular, and axillary nodes normal.  No results found for: HGBA1C  Lab Results  Component Value Date   CREATININE 0.86 07/28/2015   CREATININE 0.76 01/16/2015   CREATININE 0.7 05/28/2014    Lab Results  Component Value Date   WBC 4.4 07/28/2015   HGB 12.5 07/28/2015   HCT 37.6 07/28/2015   PLT 147.0* 07/28/2015   GLUCOSE 96 07/28/2015   CHOL 225* 01/16/2015   TRIG 104.0 01/16/2015   HDL 79.90 01/16/2015   LDLDIRECT 134.7 04/19/2013   LDLCALC 124* 01/16/2015   ALT 14 01/16/2015   AST 23 01/16/2015   NA 139 07/28/2015   K 3.9 07/28/2015   CL 101 07/28/2015   CREATININE 0.86 07/28/2015   BUN 23 07/28/2015   CO2 35* 07/28/2015   TSH 2.57 07/28/2015   INR 0.85 05/16/2012   MICROALBUR 0.3 11/01/2012    Ct Thoracic Spine Wo Contrast  09/11/2015  CLINICAL DATA:  69 year old female with scoliosis, chronic pain and spinal stimulator. Difficulty walking. Subsequent encounter. EXAM: CT THORACIC SPINE WITHOUT CONTRAST TECHNIQUE: Multidetector CT imaging of the thoracic spine was performed without intravenous contrast administration. Multiplanar CT image reconstructions were also generated. COMPARISON:  CT thoracic spine 07/28/2015. CT lumbar spine from today and 07/28/2015. FINDINGS:  Same numbering system as on the October comparison. Stable thoracic vertebral height and alignment since October. Thoracic spinal stimulator re- demonstrated situated in the posterior spinal canal with electrodes at the T7 and T8 level. Visualized electrical leads appear unchanged. Chronic right posterior rib fracture at T5. Thoracic transpedicular hardware from T10-T12 is stable, and intact without evidence of loosening. There is solid posterior element arthrodesis from the T10 to the T12 level. Chronic disc and endplate degeneration at T9-T10 appears stable. Other thoracic levels appear stable. Above the thoracic spinal stimulator there is no evidence of spinal stenosis. Partially visible lower cervical ACDF hardware to C7. Visualized lower cervical vertebral bodies demonstrate solid arthrodesis. Stable visualized thoracic and upper abdominal viscera. Mild descending thoracic aortic calcified atherosclerosis. Stable surgical clips at the right thoracic inlet related probably to right hemithyroidectomy. Chronic apical lung scarring. Dependent atelectasis. Chronic left lower lobe scarring. IMPRESSION: 1. Unchanged CT appearance of the thoracic spine since October. 2.  Posterior element arthrodesis from T10-T12. Stable transpedicular hardware at those levels. 3. Stable thoracic spinal cord stimulator at at the T7-T8 levels. 4. Previous cervical ACDF with solid lower cervical interbody arthrodesis. Electronically Signed   By: Genevie Ann M.D.   On: 09/11/2015 14:42   Ct Lumbar Spine Wo Contrast  09/11/2015  CLINICAL DATA:  69 year old female with scoliosis, chronic pain and spinal stimulator. Difficulty walking. Subsequent encounter. EXAM: CT LUMBAR SPINE WITHOUT CONTRAST TECHNIQUE: Multidetector CT imaging of the lumbar spine was performed without intravenous contrast administration. Multiplanar CT image reconstructions were also generated. COMPARISON:  Lumbar spine CT 07/28/2015. FINDINGS: Same numbering system as in  October. Stable lumbar and visualized lower thoracic vertebral height and alignment. Chronic postoperative changes from the thoracic spine to the S1 level with posterior element and/or interbody ankylosis from T12 to the sacrum. Transpedicular hardware at T11, T12, L2, L4, L5, and on the right at S1 appears stable and intact. No interval lucency about the screws to suggest loosening. Postoperative changes to both medial iliac bones. Stable SI joints with vacuum phenomena. Chronic S3 level sacral fracture suspected and appears healed and stable since October. No acute osseous abnormality identified. Stable lung bases negative aside from scarring. Aortoiliac calcified atherosclerosis noted. Stable visualized abdominal and pelvic viscera, including retained stool in the colon. Negative postoperative appearance of the posterior paraspinal soft tissues. IMPRESSION: 1. Unchanged CT appearance of the lumbosacral spine since October. 2. Posterior element and/or it interbody ankylosis from T12 to the sacrum. Multilevel transpedicular hardware appears stable and intact. 3. Chronic, healed S3 level sacral fracture. 4. Calcified aortic atherosclerosis. Electronically Signed   By: Genevie Ann M.D.   On: 09/11/2015 13:54    Assessment & Plan:   Problem List Items Addressed This Visit    Sinusitis, chronic    Currently under treatment by ENT with 11 days of antibiotis left to take and improved symptoms.       Relevant Medications   fluticasone (FLONASE) 50 MCG/ACT nasal spray   Preoperative clearance    Patient  is considered to be at low risk  For perioperative complications  Based on today's exam and history.  Baseline lytes,  hgb and ekg have already been done       Anxiety state - Primary   Relevant Medications   diazepam (VALIUM) 5 MG tablet   Osteopenia    Other Visit Diagnoses    Estrogen deficiency        Relevant Orders    DG Bone Density    Vitamin D deficiency        Relevant Orders    VITAMIN D  25 Hydroxy (Vit-D Deficiency, Fractures)       I have discontinued Ms. Waszak's mometasone. I am also having her start on diazepam. Additionally, I am having her maintain her morphine, gabapentin, polyethylene glycol, potassium chloride, methocarbamol, Eszopiclone, tiZANidine, estradiol, ibuprofen, torsemide, multivitamin with minerals, naloxegol oxalate, SYNTHROID, Calcium Polycarbophil (FIBER-CAPS PO), levofloxacin, predniSONE, conjugated estrogens, Tdap, and fluticasone.  Meds ordered this encounter  Medications  . fluticasone (FLONASE) 50 MCG/ACT nasal spray    Sig: Place 2 sprays into both nostrils daily.  . diazepam (VALIUM) 5 MG tablet    Sig: Take 1 tablet (5 mg total) by mouth every 12 (twelve) hours as needed for anxiety.    Dispense:  30 tablet    Refill:  0    Medications Discontinued During This Encounter  Medication Reason  . mometasone (NASONEX) 50 MCG/ACT nasal spray Change  in therapy    Follow-up: No Follow-up on file.   Crecencio Mc, MD ds

## 2015-09-25 ENCOUNTER — Encounter: Payer: Self-pay | Admitting: Podiatry

## 2015-09-25 ENCOUNTER — Ambulatory Visit (INDEPENDENT_AMBULATORY_CARE_PROVIDER_SITE_OTHER): Payer: Medicare Other | Admitting: Podiatry

## 2015-09-25 ENCOUNTER — Ambulatory Visit (INDEPENDENT_AMBULATORY_CARE_PROVIDER_SITE_OTHER): Payer: Medicare Other

## 2015-09-25 VITALS — BP 103/60 | HR 70 | Resp 16

## 2015-09-25 DIAGNOSIS — T8484XA Pain due to internal orthopedic prosthetic devices, implants and grafts, initial encounter: Secondary | ICD-10-CM | POA: Diagnosis not present

## 2015-09-25 DIAGNOSIS — M79672 Pain in left foot: Secondary | ICD-10-CM

## 2015-09-25 NOTE — Patient Instructions (Signed)
Pre-Operative Instructions  Congratulations, you have decided to take an important step to improving your quality of life.  You can be assured that the doctors of [facility] will be with you every step of the way.  1. Plan to be at the surgery center/hospital at least 1 (one) hour prior to your scheduled time unless otherwise directed by the surgical center/hospital staff.  You must have a responsible adult accompany you, remain during the surgery and drive you home.  Make sure you have directions to the surgical center/hospital and know how to get there on time. 2. For hospital based surgery you will need to obtain a history and physical form from your family physician within 1 month prior to the date of surgery- we will give you a form for you primary physician.  3. We make every effort to accommodate the date you request for surgery.  There are however, times where surgery dates or times have to be moved.  We will contact you as soon as possible if a change in schedule is required.   4. No Aspirin/Ibuprofen for one week before surgery.  If you are on aspirin, any non-steroidal anti-inflammatory medications (Mobic, Aleve, Ibuprofen) you should stop taking it 7 days prior to your surgery.  You make take Tylenol  For pain prior to surgery.  5. Medications- If you are taking daily heart and blood pressure medications, seizure, reflux, allergy, asthma, anxiety, pain or diabetes medications, make sure the surgery center/hospital is aware before the day of surgery so they may notify you which medications to take or avoid the day of surgery. 6. No food or drink after midnight the night before surgery unless directed otherwise by surgical center/hospital staff. 7. No alcoholic beverages 24 hours prior to surgery.  No smoking 24 hours prior to or 24 hours after surgery. 8. Wear loose pants or shorts- loose enough to fit over bandages, boots, and casts. 9. No slip on shoes, sneakers are best. 10. Bring  your boot with you to the surgery center/hospital.  Also bring crutches or a walker if your physician has prescribed it for you.  If you do not have this equipment, it will be provided for you after surgery. 11. If you have not been contracted by the surgery center/hospital by the day before your surgery, call to confirm the date and time of your surgery. 12. Leave-time from work may vary depending on the type of surgery you have.  Appropriate arrangements should be made prior to surgery with your employer. 13. Prescriptions will be provided immediately following surgery by your doctor.  Have these filled as soon as possible after surgery and take the medication as directed. 14. Remove nail polish on the operative foot. 15. Wash the night before surgery.  The night before surgery wash the foot and leg well with the antibacterial soap provided and water paying special attention to beneath the toenails and in between the toes.  Rinse thoroughly with water and dry well with a towel.  Perform this wash unless told not to do so by your physician.  Enclosed: 1 Ice pack (please put in freezer the night before surgery)   1 Hibiclens skin cleaner   Pre-op Instructions  If you have any questions regarding the instructions, do not hesitate to call our office.  [place]: [address] [place], [place] [address] [phone]  [place]: [address] [place], [place] [address] [phone]  [place]: [address]  [place], [place] [address] [phone]  Dr. Richard   Tuchman DPM, Dr. Norman Regal DPM Dr. Richard Sikora DPM, Dr. M. Todd Hyatt DPM, Dr. Kathryn Egerton DPM 

## 2015-09-25 NOTE — Telephone Encounter (Signed)
PA started

## 2015-09-27 DIAGNOSIS — Z01818 Encounter for other preprocedural examination: Secondary | ICD-10-CM | POA: Insufficient documentation

## 2015-09-27 NOTE — Assessment & Plan Note (Signed)
Patient  is considered to be at low risk  For perioperative complications  Based on today's exam and history.  Baseline lytes,  hgb and ekg have already been done

## 2015-09-27 NOTE — Assessment & Plan Note (Signed)
Currently under treatment by ENT with 11 days of antibiotis left to take and improved symptoms.

## 2015-09-29 NOTE — Progress Notes (Signed)
She presents today with a chief complaint of painful toes #2 #3 and #4 of the left foot. It has been approximately 2 years since I saw her last for a second and third metatarsal osteotomy as well as hammertoe repairs with screws #2 and #3 of the left foot. She states that the screws seem to be hurting my toes. I also have this fourth toe that rubs underneath the third toe because it stands straight up. I would like to consider having the screws removed because they're painful and see if there is anything we can do to the fourth toe.  Objective: Vital signs are stable she's alert and oriented 3 I have reviewed her past medical history medications allergy surgery social history and review of systems. Pulses are strongly palpable. Neurologic sensorium is intact persons once the monofilament. Muscle strength is intact. Deep tendon reflexes are intact. Orthopedic evaluation demonstrates rectus toes #2 and #3 of the left foot adductovarus rotated fourth toe left foot possibly associated with elevation of not toes #2 and #3 she does have considerable scar tissue that does not allow the second and third toe to lie down flat. She also has tenderness on palpation of the second third metatarsal necks where the screws are located. She also has tenderness on palpation of the toenail on toes #2 and #3 associated with the head of the screw in those toes. Radiographs confirm good position of the toes and the screws with good healing of the arthrodesis site. Cutaneous evaluation and stress no open wounds.  Assessment: Painful toes #2 and #3 as well as metatarsals #2 and #3 secondary to painful internal fixation. She also has contracted second metatarsal and third metatarsophalangeal joints. Also has a hammertoe deformity fourth digit left foot.  Plan: Discussed etiology pathology conservative versus surgical therapies. At this point we went over a consent form today line by line number by number giving her ample time to ask  questions she saw fit regarding removal of all internal fixation as well as possible correction of the fourth digit left foot with an arthroplasty. I answered all the questions regarding these procedures to the best of my ability in layman's terms. She understood this was amenable to inside of 3 pages of the consent form. I will follow-up with her in the near future.

## 2015-09-30 DIAGNOSIS — Z79891 Long term (current) use of opiate analgesic: Secondary | ICD-10-CM | POA: Diagnosis not present

## 2015-09-30 DIAGNOSIS — G4700 Insomnia, unspecified: Secondary | ICD-10-CM | POA: Diagnosis not present

## 2015-09-30 DIAGNOSIS — G894 Chronic pain syndrome: Secondary | ICD-10-CM | POA: Diagnosis not present

## 2015-09-30 DIAGNOSIS — M961 Postlaminectomy syndrome, not elsewhere classified: Secondary | ICD-10-CM | POA: Diagnosis not present

## 2015-10-03 ENCOUNTER — Telehealth: Payer: Self-pay | Admitting: *Deleted

## 2015-10-03 NOTE — Telephone Encounter (Signed)
Sent requested records to number provided. Thanks

## 2015-10-03 NOTE — Telephone Encounter (Signed)
Olivia Mackie from [facility] has requested patients pre-op spine surgery progress notes and labs. Please fax information to fax [phone].

## 2015-10-08 ENCOUNTER — Other Ambulatory Visit: Payer: Self-pay | Admitting: *Deleted

## 2015-10-08 MED ORDER — DIAZEPAM 5 MG PO TABS: 5.0000 mg | ORAL_TABLET | Freq: Two times a day (BID) | ORAL | Status: DC | PRN

## 2015-10-08 NOTE — Telephone Encounter (Signed)
Please advise refill, last filled on 09/24/15 #30 with no refills.  thanks

## 2015-10-08 NOTE — Telephone Encounter (Signed)
Ok to refill,  printed rx can fax

## 2015-10-08 NOTE — Telephone Encounter (Signed)
Patient left a voicemail requesting a Rx for valium. Please advise

## 2015-10-13 ENCOUNTER — Telehealth: Payer: Self-pay

## 2015-10-13 DIAGNOSIS — M26609 Unspecified temporomandibular joint disorder, unspecified side: Secondary | ICD-10-CM

## 2015-10-13 MED ORDER — TIZANIDINE HCL 4 MG PO TABS: 4.0000 mg | ORAL_TABLET | Freq: Four times a day (QID) | ORAL | Status: DC | PRN

## 2015-10-13 NOTE — Addendum Note (Signed)
Addended by: Crecencio Mc on: 10/13/2015 01:52 PM   Modules accepted: Orders

## 2015-10-13 NOTE — Telephone Encounter (Signed)
Patient had called the triage line.  She had requested a refill on her valium last week.  It was sent to the [facility] in [place].  She is snowed in so I have resent the prescription to [facility] in [place] per her request.  Patient was advised to stop all nacrotics for her upcoming surgery.  She has been told to decrease her morphine in half.  She is having a increase in arm and leg nerve discomfort.  "She referred to it as electricity pains in her extremities."  She doesn't want narcotics, just something like flexeril or tizanidine?  Is taking the methocarbonal 500mg  , but it is not helping.  Please advise?

## 2015-10-13 NOTE — Telephone Encounter (Signed)
I recommend a trial of gabapentin for the pain described,  But will also send in a muscle relaxer if the gabapentin dose not help. [facility] [place]

## 2015-10-13 NOTE — Telephone Encounter (Signed)
Correction,  increase the gabapentin she already has to 300 mg up to 3 times daily, and she has tizanidine I will refill instread of the methocarbamal

## 2015-10-13 NOTE — Telephone Encounter (Signed)
Left Patient a message with changes.  Advised to call the office if further concerns.

## 2015-10-14 ENCOUNTER — Other Ambulatory Visit: Payer: Medicare Other

## 2015-10-14 ENCOUNTER — Other Ambulatory Visit: Payer: Self-pay | Admitting: Orthopedic Surgery

## 2015-10-14 ENCOUNTER — Other Ambulatory Visit (INDEPENDENT_AMBULATORY_CARE_PROVIDER_SITE_OTHER): Payer: Medicare Other

## 2015-10-14 DIAGNOSIS — E559 Vitamin D deficiency, unspecified: Secondary | ICD-10-CM

## 2015-10-14 DIAGNOSIS — Z01818 Encounter for other preprocedural examination: Secondary | ICD-10-CM

## 2015-10-14 LAB — COMPREHENSIVE METABOLIC PANEL
ALT: 20 U/L (ref 0–35)
AST: 33 U/L (ref 0–37)
Albumin: 4.2 g/dL (ref 3.5–5.2)
Alkaline Phosphatase: 46 U/L (ref 39–117)
BUN: 16 mg/dL (ref 6–23)
CO2: 30 mEq/L (ref 19–32)
Calcium: 9.5 mg/dL (ref 8.4–10.5)
Chloride: 102 mEq/L (ref 96–112)
Creatinine, Ser: 0.83 mg/dL (ref 0.40–1.20)
GFR: 72.67 mL/min (ref >60.00)
Glucose, Bld: 81 mg/dL (ref 70–99)
Potassium: 3.4 mEq/L — ABNORMAL LOW (ref 3.5–5.1)
Sodium: 142 mEq/L (ref 135–145)
Total Bilirubin: 0.6 mg/dL (ref 0.2–1.2)
Total Protein: 6.5 g/dL (ref 6.0–8.3)

## 2015-10-14 LAB — CBC WITH DIFFERENTIAL/PLATELET
Basophils Absolute: 0 10*3/uL (ref 0.0–0.1)
Basophils Relative: 0.5 % (ref 0.0–3.0)
Eosinophils Absolute: 0.1 10*3/uL (ref 0.0–0.7)
Eosinophils Relative: 1.3 % (ref 0.0–5.0)
HCT: 41.2 % (ref 36.0–46.0)
Hemoglobin: 13.7 g/dL (ref 12.0–15.0)
Lymphocytes Relative: 37.6 % (ref 12.0–46.0)
Lymphs Abs: 1.8 10*3/uL (ref 0.7–4.0)
MCHC: 33.2 g/dL (ref 30.0–36.0)
MCV: 94.2 fl (ref 78.0–100.0)
Monocytes Absolute: 0.3 10*3/uL (ref 0.1–1.0)
Monocytes Relative: 6.3 % (ref 3.0–12.0)
Neutro Abs: 2.6 10*3/uL (ref 1.4–7.7)
Neutrophils Relative %: 54.3 % (ref 43.0–77.0)
Platelets: 167 10*3/uL (ref 150.0–400.0)
RBC: 4.37 Mil/uL (ref 3.87–5.11)
RDW: 13.3 % (ref 11.5–15.5)
WBC: 4.8 10*3/uL (ref 4.0–10.5)

## 2015-10-14 LAB — VITAMIN D 25 HYDROXY (VIT D DEFICIENCY, FRACTURES): VITD: 40.8 ng/mL (ref 30.00–100.00)

## 2015-10-15 ENCOUNTER — Telehealth: Payer: Self-pay

## 2015-10-15 ENCOUNTER — Other Ambulatory Visit (HOSPITAL_COMMUNITY): Payer: Self-pay | Admitting: Orthopedic Surgery

## 2015-10-15 ENCOUNTER — Encounter: Payer: Self-pay | Admitting: *Deleted

## 2015-10-15 LAB — HM DEXA SCAN

## 2015-10-15 NOTE — Telephone Encounter (Signed)
Ms Liebold is at low risk for surgery, no medications need to be held. Clearance along with Dr Blenda Mounts office note has been faxed to Dr Marykay Lex at [facility] for Scoliosis and Spine Surgery.

## 2015-10-16 DIAGNOSIS — M899 Disorder of bone, unspecified: Secondary | ICD-10-CM | POA: Diagnosis not present

## 2015-10-16 DIAGNOSIS — M4125 Other idiopathic scoliosis, thoracolumbar region: Secondary | ICD-10-CM | POA: Diagnosis not present

## 2015-10-16 DIAGNOSIS — M546 Pain in thoracic spine: Secondary | ICD-10-CM | POA: Diagnosis not present

## 2015-10-16 DIAGNOSIS — Z13820 Encounter for screening for osteoporosis: Secondary | ICD-10-CM | POA: Diagnosis not present

## 2015-10-16 DIAGNOSIS — M8580 Other specified disorders of bone density and structure, unspecified site: Secondary | ICD-10-CM | POA: Diagnosis not present

## 2015-10-16 DIAGNOSIS — M4014 Other secondary kyphosis, thoracic region: Secondary | ICD-10-CM | POA: Diagnosis not present

## 2015-10-16 DIAGNOSIS — R262 Difficulty in walking, not elsewhere classified: Secondary | ICD-10-CM | POA: Diagnosis not present

## 2015-10-17 DIAGNOSIS — J310 Chronic rhinitis: Secondary | ICD-10-CM | POA: Diagnosis not present

## 2015-10-19 ENCOUNTER — Telehealth: Payer: Self-pay | Admitting: Internal Medicine

## 2015-10-19 NOTE — Telephone Encounter (Signed)
Bone Density scores received, she has osteopenia,  Moderate.  Would repeat in 2 years and consider therapy then if there is a significant change. Continue calcium, vitamin d and weight bearing exercise on a regular basis.  

## 2015-10-21 NOTE — Telephone Encounter (Signed)
Patient notified of results and voiced understanding. Patient wanted to make MD aware her surgery has been postponed Until 11/11/15.

## 2015-10-29 ENCOUNTER — Telehealth: Payer: Self-pay | Admitting: Internal Medicine

## 2015-10-29 DIAGNOSIS — M7071 Other bursitis of hip, right hip: Secondary | ICD-10-CM | POA: Diagnosis not present

## 2015-10-29 NOTE — Telephone Encounter (Signed)
Yes , I will

## 2015-10-29 NOTE — Telephone Encounter (Signed)
Pt lvm stating that Dr. Derrel Nip told her that she would accept her neighbor as a np. Her name is Sonya Cordova. She did not leave any contact information but wanted [place] to know if calls then she needs to be scheduled with [place]. Please advise.

## 2015-11-04 ENCOUNTER — Other Ambulatory Visit: Payer: Self-pay | Admitting: Internal Medicine

## 2015-11-11 DIAGNOSIS — M4014 Other secondary kyphosis, thoracic region: Secondary | ICD-10-CM | POA: Diagnosis not present

## 2015-11-11 DIAGNOSIS — Z981 Arthrodesis status: Secondary | ICD-10-CM | POA: Diagnosis not present

## 2015-11-11 DIAGNOSIS — R279 Unspecified lack of coordination: Secondary | ICD-10-CM | POA: Diagnosis not present

## 2015-11-11 DIAGNOSIS — E890 Postprocedural hypothyroidism: Secondary | ICD-10-CM | POA: Diagnosis present

## 2015-11-11 DIAGNOSIS — R32 Unspecified urinary incontinence: Secondary | ICD-10-CM | POA: Diagnosis present

## 2015-11-11 DIAGNOSIS — R5381 Other malaise: Secondary | ICD-10-CM | POA: Diagnosis not present

## 2015-11-11 DIAGNOSIS — D62 Acute posthemorrhagic anemia: Secondary | ICD-10-CM | POA: Diagnosis not present

## 2015-11-11 DIAGNOSIS — T50995A Adverse effect of other drugs, medicaments and biological substances, initial encounter: Secondary | ICD-10-CM | POA: Diagnosis not present

## 2015-11-11 DIAGNOSIS — M4125 Other idiopathic scoliosis, thoracolumbar region: Secondary | ICD-10-CM | POA: Diagnosis not present

## 2015-11-11 DIAGNOSIS — G8928 Other chronic postprocedural pain: Secondary | ICD-10-CM | POA: Diagnosis not present

## 2015-11-11 DIAGNOSIS — R34 Anuria and oliguria: Secondary | ICD-10-CM | POA: Diagnosis not present

## 2015-11-11 DIAGNOSIS — Z87891 Personal history of nicotine dependence: Secondary | ICD-10-CM | POA: Diagnosis not present

## 2015-11-11 DIAGNOSIS — M6281 Muscle weakness (generalized): Secondary | ICD-10-CM | POA: Diagnosis not present

## 2015-11-11 DIAGNOSIS — G8918 Other acute postprocedural pain: Secondary | ICD-10-CM | POA: Diagnosis present

## 2015-11-11 DIAGNOSIS — M62838 Other muscle spasm: Secondary | ICD-10-CM | POA: Diagnosis not present

## 2015-11-11 DIAGNOSIS — K5900 Constipation, unspecified: Secondary | ICD-10-CM | POA: Diagnosis present

## 2015-11-11 DIAGNOSIS — Z9889 Other specified postprocedural states: Secondary | ICD-10-CM | POA: Diagnosis not present

## 2015-11-11 DIAGNOSIS — M4325 Fusion of spine, thoracolumbar region: Secondary | ICD-10-CM | POA: Diagnosis not present

## 2015-11-11 DIAGNOSIS — I952 Hypotension due to drugs: Secondary | ICD-10-CM | POA: Diagnosis not present

## 2015-11-11 DIAGNOSIS — R262 Difficulty in walking, not elsewhere classified: Secondary | ICD-10-CM | POA: Diagnosis present

## 2015-11-11 DIAGNOSIS — E861 Hypovolemia: Secondary | ICD-10-CM | POA: Diagnosis not present

## 2015-11-11 DIAGNOSIS — M4120 Other idiopathic scoliosis, site unspecified: Secondary | ICD-10-CM | POA: Diagnosis present

## 2015-11-17 DIAGNOSIS — G5691 Unspecified mononeuropathy of right upper limb: Secondary | ICD-10-CM | POA: Diagnosis not present

## 2015-11-17 DIAGNOSIS — M4014 Other secondary kyphosis, thoracic region: Secondary | ICD-10-CM | POA: Diagnosis not present

## 2015-11-17 DIAGNOSIS — M6281 Muscle weakness (generalized): Secondary | ICD-10-CM | POA: Diagnosis not present

## 2015-11-17 DIAGNOSIS — R5381 Other malaise: Secondary | ICD-10-CM | POA: Diagnosis not present

## 2015-11-17 DIAGNOSIS — M4125 Other idiopathic scoliosis, thoracolumbar region: Secondary | ICD-10-CM | POA: Diagnosis not present

## 2015-11-17 DIAGNOSIS — G8929 Other chronic pain: Secondary | ICD-10-CM | POA: Diagnosis not present

## 2015-11-17 DIAGNOSIS — G4709 Other insomnia: Secondary | ICD-10-CM | POA: Diagnosis not present

## 2015-11-17 DIAGNOSIS — G478 Other sleep disorders: Secondary | ICD-10-CM | POA: Diagnosis not present

## 2015-11-17 DIAGNOSIS — M4180 Other forms of scoliosis, site unspecified: Secondary | ICD-10-CM | POA: Diagnosis not present

## 2015-11-17 DIAGNOSIS — M5489 Other dorsalgia: Secondary | ICD-10-CM | POA: Diagnosis not present

## 2015-11-17 DIAGNOSIS — K5909 Other constipation: Secondary | ICD-10-CM | POA: Diagnosis not present

## 2015-11-17 DIAGNOSIS — R279 Unspecified lack of coordination: Secondary | ICD-10-CM | POA: Diagnosis not present

## 2015-11-17 DIAGNOSIS — I952 Hypotension due to drugs: Secondary | ICD-10-CM | POA: Diagnosis not present

## 2015-11-17 DIAGNOSIS — M62838 Other muscle spasm: Secondary | ICD-10-CM | POA: Diagnosis not present

## 2015-11-17 DIAGNOSIS — M4325 Fusion of spine, thoracolumbar region: Secondary | ICD-10-CM | POA: Diagnosis not present

## 2015-11-17 DIAGNOSIS — G8928 Other chronic postprocedural pain: Secondary | ICD-10-CM | POA: Diagnosis not present

## 2015-11-19 ENCOUNTER — Telehealth: Payer: Self-pay | Admitting: Internal Medicine

## 2015-11-19 DIAGNOSIS — M5489 Other dorsalgia: Secondary | ICD-10-CM | POA: Diagnosis not present

## 2015-11-19 DIAGNOSIS — G8929 Other chronic pain: Secondary | ICD-10-CM | POA: Diagnosis not present

## 2015-11-19 DIAGNOSIS — G478 Other sleep disorders: Secondary | ICD-10-CM | POA: Diagnosis not present

## 2015-11-19 NOTE — Telephone Encounter (Signed)
Okay I confirmed that number should be [phone].  Try that and let me know if it doesn't work.  thanks

## 2015-11-19 NOTE — Telephone Encounter (Signed)
I am having no luck reaching Sonya Cordova at the number given  Re Sonya Cordova  [phone].  If you are able to get through can you put her on hold and notify me?

## 2015-11-19 NOTE — Telephone Encounter (Signed)
Thanks.  I did make contact

## 2015-11-21 DIAGNOSIS — G4709 Other insomnia: Secondary | ICD-10-CM | POA: Diagnosis not present

## 2015-11-21 DIAGNOSIS — M4180 Other forms of scoliosis, site unspecified: Secondary | ICD-10-CM | POA: Diagnosis not present

## 2015-11-21 DIAGNOSIS — M62838 Other muscle spasm: Secondary | ICD-10-CM | POA: Diagnosis not present

## 2015-11-26 DIAGNOSIS — K5909 Other constipation: Secondary | ICD-10-CM | POA: Diagnosis not present

## 2015-11-26 DIAGNOSIS — M5489 Other dorsalgia: Secondary | ICD-10-CM | POA: Diagnosis not present

## 2015-11-26 DIAGNOSIS — G5691 Unspecified mononeuropathy of right upper limb: Secondary | ICD-10-CM | POA: Diagnosis not present

## 2015-11-26 DIAGNOSIS — G478 Other sleep disorders: Secondary | ICD-10-CM | POA: Diagnosis not present

## 2015-11-27 DIAGNOSIS — G8929 Other chronic pain: Secondary | ICD-10-CM | POA: Diagnosis not present

## 2015-11-27 DIAGNOSIS — M549 Dorsalgia, unspecified: Secondary | ICD-10-CM | POA: Diagnosis not present

## 2015-11-27 DIAGNOSIS — Z4789 Encounter for other orthopedic aftercare: Secondary | ICD-10-CM | POA: Diagnosis not present

## 2015-11-27 DIAGNOSIS — M5136 Other intervertebral disc degeneration, lumbar region: Secondary | ICD-10-CM | POA: Diagnosis not present

## 2015-11-27 DIAGNOSIS — M1990 Unspecified osteoarthritis, unspecified site: Secondary | ICD-10-CM | POA: Diagnosis not present

## 2015-11-27 DIAGNOSIS — E039 Hypothyroidism, unspecified: Secondary | ICD-10-CM | POA: Diagnosis not present

## 2015-11-27 DIAGNOSIS — M6281 Muscle weakness (generalized): Secondary | ICD-10-CM | POA: Diagnosis not present

## 2015-11-28 ENCOUNTER — Telehealth: Payer: Self-pay | Admitting: *Deleted

## 2015-11-28 NOTE — Telephone Encounter (Signed)
Caren Griffins from [facility] called and stated, "Patient called and stated she needed to reschedule her surgery from 12/05/2015.  Please give her a call."  I called patient to see if she wanted to reschedule her surgery.  "No, I don't want to reschedule at this time.  I just had major back surgery.  It had been put off for a while then it was finally scheduled.  It screwed up my plans."  I will let Dr. Milinda Pointer know.  We wish you well.

## 2015-12-01 NOTE — Telephone Encounter (Signed)
So am I correct in thinking that she does not want to reschedule her surgery?  No this Friday is not an option.

## 2015-12-02 ENCOUNTER — Telehealth: Payer: Self-pay | Admitting: *Deleted

## 2015-12-02 DIAGNOSIS — M5136 Other intervertebral disc degeneration, lumbar region: Secondary | ICD-10-CM | POA: Diagnosis not present

## 2015-12-02 DIAGNOSIS — M6281 Muscle weakness (generalized): Secondary | ICD-10-CM | POA: Diagnosis not present

## 2015-12-02 DIAGNOSIS — G4700 Insomnia, unspecified: Secondary | ICD-10-CM | POA: Diagnosis not present

## 2015-12-02 DIAGNOSIS — G894 Chronic pain syndrome: Secondary | ICD-10-CM | POA: Diagnosis not present

## 2015-12-02 DIAGNOSIS — M549 Dorsalgia, unspecified: Secondary | ICD-10-CM | POA: Diagnosis not present

## 2015-12-02 DIAGNOSIS — G8929 Other chronic pain: Secondary | ICD-10-CM | POA: Diagnosis not present

## 2015-12-02 DIAGNOSIS — M961 Postlaminectomy syndrome, not elsewhere classified: Secondary | ICD-10-CM | POA: Diagnosis not present

## 2015-12-02 DIAGNOSIS — E039 Hypothyroidism, unspecified: Secondary | ICD-10-CM | POA: Diagnosis not present

## 2015-12-02 DIAGNOSIS — Z4789 Encounter for other orthopedic aftercare: Secondary | ICD-10-CM | POA: Diagnosis not present

## 2015-12-02 DIAGNOSIS — Z79891 Long term (current) use of opiate analgesic: Secondary | ICD-10-CM | POA: Diagnosis not present

## 2015-12-02 NOTE — Telephone Encounter (Signed)
Sonya Cordova from [facility] has requested orders for home health Physical therapy 2x a week for 4 weeks.  A detail message can be left on the voice mail ,[phone]

## 2015-12-02 NOTE — Telephone Encounter (Signed)
Please advise, thanks.

## 2015-12-02 NOTE — Telephone Encounter (Signed)
Ok to give verbal authorization as below

## 2015-12-03 NOTE — Telephone Encounter (Signed)
Lora notified and verbal given for PT.

## 2015-12-05 DIAGNOSIS — M549 Dorsalgia, unspecified: Secondary | ICD-10-CM | POA: Diagnosis not present

## 2015-12-05 DIAGNOSIS — E039 Hypothyroidism, unspecified: Secondary | ICD-10-CM | POA: Diagnosis not present

## 2015-12-05 DIAGNOSIS — M6281 Muscle weakness (generalized): Secondary | ICD-10-CM | POA: Diagnosis not present

## 2015-12-05 DIAGNOSIS — M5136 Other intervertebral disc degeneration, lumbar region: Secondary | ICD-10-CM | POA: Diagnosis not present

## 2015-12-05 DIAGNOSIS — Z4789 Encounter for other orthopedic aftercare: Secondary | ICD-10-CM | POA: Diagnosis not present

## 2015-12-05 DIAGNOSIS — G8929 Other chronic pain: Secondary | ICD-10-CM | POA: Diagnosis not present

## 2015-12-08 DIAGNOSIS — E039 Hypothyroidism, unspecified: Secondary | ICD-10-CM | POA: Diagnosis not present

## 2015-12-08 DIAGNOSIS — M549 Dorsalgia, unspecified: Secondary | ICD-10-CM | POA: Diagnosis not present

## 2015-12-08 DIAGNOSIS — M5136 Other intervertebral disc degeneration, lumbar region: Secondary | ICD-10-CM | POA: Diagnosis not present

## 2015-12-08 DIAGNOSIS — Z4789 Encounter for other orthopedic aftercare: Secondary | ICD-10-CM | POA: Diagnosis not present

## 2015-12-08 DIAGNOSIS — M6281 Muscle weakness (generalized): Secondary | ICD-10-CM | POA: Diagnosis not present

## 2015-12-08 DIAGNOSIS — G8929 Other chronic pain: Secondary | ICD-10-CM | POA: Diagnosis not present

## 2015-12-09 DIAGNOSIS — M6281 Muscle weakness (generalized): Secondary | ICD-10-CM | POA: Diagnosis not present

## 2015-12-09 DIAGNOSIS — M5136 Other intervertebral disc degeneration, lumbar region: Secondary | ICD-10-CM | POA: Diagnosis not present

## 2015-12-09 DIAGNOSIS — M549 Dorsalgia, unspecified: Secondary | ICD-10-CM | POA: Diagnosis not present

## 2015-12-09 DIAGNOSIS — G8929 Other chronic pain: Secondary | ICD-10-CM | POA: Diagnosis not present

## 2015-12-09 DIAGNOSIS — Z4789 Encounter for other orthopedic aftercare: Secondary | ICD-10-CM | POA: Diagnosis not present

## 2015-12-09 DIAGNOSIS — E039 Hypothyroidism, unspecified: Secondary | ICD-10-CM | POA: Diagnosis not present

## 2015-12-10 DIAGNOSIS — G8929 Other chronic pain: Secondary | ICD-10-CM | POA: Diagnosis not present

## 2015-12-10 DIAGNOSIS — Z4789 Encounter for other orthopedic aftercare: Secondary | ICD-10-CM | POA: Diagnosis not present

## 2015-12-10 DIAGNOSIS — M5136 Other intervertebral disc degeneration, lumbar region: Secondary | ICD-10-CM | POA: Diagnosis not present

## 2015-12-10 DIAGNOSIS — M6281 Muscle weakness (generalized): Secondary | ICD-10-CM | POA: Diagnosis not present

## 2015-12-10 DIAGNOSIS — M549 Dorsalgia, unspecified: Secondary | ICD-10-CM | POA: Diagnosis not present

## 2015-12-10 DIAGNOSIS — E039 Hypothyroidism, unspecified: Secondary | ICD-10-CM | POA: Diagnosis not present

## 2015-12-11 ENCOUNTER — Encounter: Payer: Self-pay | Admitting: Podiatry

## 2015-12-12 DIAGNOSIS — M5136 Other intervertebral disc degeneration, lumbar region: Secondary | ICD-10-CM | POA: Diagnosis not present

## 2015-12-12 DIAGNOSIS — G8929 Other chronic pain: Secondary | ICD-10-CM | POA: Diagnosis not present

## 2015-12-12 DIAGNOSIS — E039 Hypothyroidism, unspecified: Secondary | ICD-10-CM | POA: Diagnosis not present

## 2015-12-12 DIAGNOSIS — M6281 Muscle weakness (generalized): Secondary | ICD-10-CM | POA: Diagnosis not present

## 2015-12-12 DIAGNOSIS — Z4789 Encounter for other orthopedic aftercare: Secondary | ICD-10-CM | POA: Diagnosis not present

## 2015-12-12 DIAGNOSIS — M549 Dorsalgia, unspecified: Secondary | ICD-10-CM | POA: Diagnosis not present

## 2015-12-16 DIAGNOSIS — G8929 Other chronic pain: Secondary | ICD-10-CM | POA: Diagnosis not present

## 2015-12-16 DIAGNOSIS — E039 Hypothyroidism, unspecified: Secondary | ICD-10-CM | POA: Diagnosis not present

## 2015-12-16 DIAGNOSIS — M6281 Muscle weakness (generalized): Secondary | ICD-10-CM | POA: Diagnosis not present

## 2015-12-16 DIAGNOSIS — Z4789 Encounter for other orthopedic aftercare: Secondary | ICD-10-CM | POA: Diagnosis not present

## 2015-12-16 DIAGNOSIS — M5136 Other intervertebral disc degeneration, lumbar region: Secondary | ICD-10-CM | POA: Diagnosis not present

## 2015-12-16 DIAGNOSIS — M549 Dorsalgia, unspecified: Secondary | ICD-10-CM | POA: Diagnosis not present

## 2015-12-17 DIAGNOSIS — M549 Dorsalgia, unspecified: Secondary | ICD-10-CM | POA: Diagnosis not present

## 2015-12-17 DIAGNOSIS — M6281 Muscle weakness (generalized): Secondary | ICD-10-CM | POA: Diagnosis not present

## 2015-12-17 DIAGNOSIS — Z4789 Encounter for other orthopedic aftercare: Secondary | ICD-10-CM | POA: Diagnosis not present

## 2015-12-17 DIAGNOSIS — M5136 Other intervertebral disc degeneration, lumbar region: Secondary | ICD-10-CM | POA: Diagnosis not present

## 2015-12-17 DIAGNOSIS — G8929 Other chronic pain: Secondary | ICD-10-CM | POA: Diagnosis not present

## 2015-12-17 DIAGNOSIS — E039 Hypothyroidism, unspecified: Secondary | ICD-10-CM | POA: Diagnosis not present

## 2015-12-18 ENCOUNTER — Encounter: Payer: Self-pay | Admitting: Podiatry

## 2015-12-19 DIAGNOSIS — M6281 Muscle weakness (generalized): Secondary | ICD-10-CM | POA: Diagnosis not present

## 2015-12-19 DIAGNOSIS — M5136 Other intervertebral disc degeneration, lumbar region: Secondary | ICD-10-CM | POA: Diagnosis not present

## 2015-12-19 DIAGNOSIS — Z4789 Encounter for other orthopedic aftercare: Secondary | ICD-10-CM | POA: Diagnosis not present

## 2015-12-19 DIAGNOSIS — G8929 Other chronic pain: Secondary | ICD-10-CM | POA: Diagnosis not present

## 2015-12-19 DIAGNOSIS — M549 Dorsalgia, unspecified: Secondary | ICD-10-CM | POA: Diagnosis not present

## 2015-12-19 DIAGNOSIS — E039 Hypothyroidism, unspecified: Secondary | ICD-10-CM | POA: Diagnosis not present

## 2015-12-23 DIAGNOSIS — E039 Hypothyroidism, unspecified: Secondary | ICD-10-CM | POA: Diagnosis not present

## 2015-12-23 DIAGNOSIS — Z4789 Encounter for other orthopedic aftercare: Secondary | ICD-10-CM | POA: Diagnosis not present

## 2015-12-23 DIAGNOSIS — M5136 Other intervertebral disc degeneration, lumbar region: Secondary | ICD-10-CM | POA: Diagnosis not present

## 2015-12-23 DIAGNOSIS — M549 Dorsalgia, unspecified: Secondary | ICD-10-CM | POA: Diagnosis not present

## 2015-12-23 DIAGNOSIS — G8929 Other chronic pain: Secondary | ICD-10-CM | POA: Diagnosis not present

## 2015-12-23 DIAGNOSIS — M6281 Muscle weakness (generalized): Secondary | ICD-10-CM | POA: Diagnosis not present

## 2015-12-24 DIAGNOSIS — E039 Hypothyroidism, unspecified: Secondary | ICD-10-CM | POA: Diagnosis not present

## 2015-12-24 DIAGNOSIS — Z4789 Encounter for other orthopedic aftercare: Secondary | ICD-10-CM | POA: Diagnosis not present

## 2015-12-24 DIAGNOSIS — G8929 Other chronic pain: Secondary | ICD-10-CM | POA: Diagnosis not present

## 2015-12-24 DIAGNOSIS — M5136 Other intervertebral disc degeneration, lumbar region: Secondary | ICD-10-CM | POA: Diagnosis not present

## 2015-12-24 DIAGNOSIS — M549 Dorsalgia, unspecified: Secondary | ICD-10-CM | POA: Diagnosis not present

## 2015-12-24 DIAGNOSIS — M6281 Muscle weakness (generalized): Secondary | ICD-10-CM | POA: Diagnosis not present

## 2015-12-25 DIAGNOSIS — M792 Neuralgia and neuritis, unspecified: Secondary | ICD-10-CM | POA: Diagnosis not present

## 2015-12-25 DIAGNOSIS — M542 Cervicalgia: Secondary | ICD-10-CM | POA: Diagnosis not present

## 2015-12-25 DIAGNOSIS — Z4782 Encounter for orthopedic aftercare following scoliosis surgery: Secondary | ICD-10-CM | POA: Diagnosis not present

## 2015-12-26 ENCOUNTER — Other Ambulatory Visit: Payer: Self-pay | Admitting: Orthopedic Surgery

## 2015-12-26 ENCOUNTER — Other Ambulatory Visit: Payer: Self-pay | Admitting: *Deleted

## 2015-12-26 DIAGNOSIS — M542 Cervicalgia: Secondary | ICD-10-CM

## 2015-12-26 DIAGNOSIS — M792 Neuralgia and neuritis, unspecified: Secondary | ICD-10-CM

## 2015-12-26 DIAGNOSIS — Z4782 Encounter for orthopedic aftercare following scoliosis surgery: Secondary | ICD-10-CM

## 2015-12-29 DIAGNOSIS — G8929 Other chronic pain: Secondary | ICD-10-CM | POA: Diagnosis not present

## 2015-12-29 DIAGNOSIS — M6281 Muscle weakness (generalized): Secondary | ICD-10-CM | POA: Diagnosis not present

## 2015-12-29 DIAGNOSIS — M5136 Other intervertebral disc degeneration, lumbar region: Secondary | ICD-10-CM | POA: Diagnosis not present

## 2015-12-29 DIAGNOSIS — Z4789 Encounter for other orthopedic aftercare: Secondary | ICD-10-CM | POA: Diagnosis not present

## 2015-12-29 DIAGNOSIS — M549 Dorsalgia, unspecified: Secondary | ICD-10-CM | POA: Diagnosis not present

## 2015-12-29 DIAGNOSIS — E039 Hypothyroidism, unspecified: Secondary | ICD-10-CM | POA: Diagnosis not present

## 2015-12-30 DIAGNOSIS — Z79891 Long term (current) use of opiate analgesic: Secondary | ICD-10-CM | POA: Diagnosis not present

## 2015-12-30 DIAGNOSIS — G894 Chronic pain syndrome: Secondary | ICD-10-CM | POA: Diagnosis not present

## 2015-12-30 DIAGNOSIS — M961 Postlaminectomy syndrome, not elsewhere classified: Secondary | ICD-10-CM | POA: Diagnosis not present

## 2015-12-30 DIAGNOSIS — G4700 Insomnia, unspecified: Secondary | ICD-10-CM | POA: Diagnosis not present

## 2015-12-31 DIAGNOSIS — M4712 Other spondylosis with myelopathy, cervical region: Secondary | ICD-10-CM | POA: Diagnosis not present

## 2016-01-02 DIAGNOSIS — G8929 Other chronic pain: Secondary | ICD-10-CM | POA: Diagnosis not present

## 2016-01-02 DIAGNOSIS — Z4789 Encounter for other orthopedic aftercare: Secondary | ICD-10-CM | POA: Diagnosis not present

## 2016-01-02 DIAGNOSIS — M6281 Muscle weakness (generalized): Secondary | ICD-10-CM | POA: Diagnosis not present

## 2016-01-02 DIAGNOSIS — M549 Dorsalgia, unspecified: Secondary | ICD-10-CM | POA: Diagnosis not present

## 2016-01-02 DIAGNOSIS — M5136 Other intervertebral disc degeneration, lumbar region: Secondary | ICD-10-CM | POA: Diagnosis not present

## 2016-01-02 DIAGNOSIS — E039 Hypothyroidism, unspecified: Secondary | ICD-10-CM | POA: Diagnosis not present

## 2016-01-05 ENCOUNTER — Telehealth: Payer: Self-pay

## 2016-01-05 ENCOUNTER — Ambulatory Visit
Admission: RE | Admit: 2016-01-05 | Discharge: 2016-01-05 | Disposition: A | Discharge status: Home / Self Care | Payer: Medicare Other | Source: Ambulatory Visit | Attending: *Deleted | Admitting: *Deleted

## 2016-01-05 DIAGNOSIS — M47812 Spondylosis without myelopathy or radiculopathy, cervical region: Secondary | ICD-10-CM | POA: Diagnosis not present

## 2016-01-05 DIAGNOSIS — M4712 Other spondylosis with myelopathy, cervical region: Secondary | ICD-10-CM | POA: Diagnosis not present

## 2016-01-05 DIAGNOSIS — M792 Neuralgia and neuritis, unspecified: Secondary | ICD-10-CM

## 2016-01-05 DIAGNOSIS — Z4782 Encounter for orthopedic aftercare following scoliosis surgery: Secondary | ICD-10-CM

## 2016-01-05 DIAGNOSIS — M542 Cervicalgia: Secondary | ICD-10-CM

## 2016-01-05 NOTE — Telephone Encounter (Signed)
FYI: Aca from Occupational Therapy called and wanted to let you know that they are discontinuing therapy for Sonya Cordova as a second referral for Outpatient therapy was received for her neck and the patient can only have one active home health case for OT.  If you have any questions you can called her at the provided number.  thanks

## 2016-01-08 DIAGNOSIS — M4712 Other spondylosis with myelopathy, cervical region: Secondary | ICD-10-CM | POA: Diagnosis not present

## 2016-01-12 DIAGNOSIS — M4712 Other spondylosis with myelopathy, cervical region: Secondary | ICD-10-CM | POA: Diagnosis not present

## 2016-01-14 DIAGNOSIS — M4712 Other spondylosis with myelopathy, cervical region: Secondary | ICD-10-CM | POA: Diagnosis not present

## 2016-01-19 ENCOUNTER — Encounter: Payer: Self-pay | Admitting: Family Medicine

## 2016-01-19 ENCOUNTER — Ambulatory Visit (INDEPENDENT_AMBULATORY_CARE_PROVIDER_SITE_OTHER): Payer: Medicare Other | Admitting: Family Medicine

## 2016-01-19 VITALS — BP 112/62 | HR 66 | Temp 98.0°F | Ht 67.0 in | Wt 130.5 lb

## 2016-01-19 DIAGNOSIS — J320 Chronic maxillary sinusitis: Secondary | ICD-10-CM

## 2016-01-19 MED ORDER — LEVOFLOXACIN 500 MG PO TABS: 500.0000 mg | ORAL_TABLET | Freq: Every day | ORAL | Status: DC

## 2016-01-19 MED ORDER — BECLOMETHASONE DIPROPIONATE 80 MCG/ACT NA AERS: INHALATION_SPRAY | NASAL | Status: DC

## 2016-01-19 NOTE — Progress Notes (Signed)
Subjective:  Patient ID: Sonya Cordova, female    DOB: 01-20-1946  Age: 70 y.o. MRN: [id number]  CC: ? Sinus infection  HPI:  70 year old female presents to clinic with concern that she's developing a sinus infection.  ? Sinus infection  2 day history of post nasal drip, ear pain, adenopathy, hoarseness, severe sinus pain and pressure.  She reports associated green nasal discharge.  No associated fevers or chills.  She reports her symptoms are worsening.  She states her symptoms are severe.  She's taken some over-the-counter medication with no improvement - zyrtec, flonase, mucinex.  No known exacerbating factors.  She is concerned about the development of a sinus infection especially since she has not coming trip to [place] later this week.  Social Hx   Social History   Social History  . Marital Status: Married    Spouse Name: N/A  . Number of Children: N/A  . Years of Education: N/A   Occupational History  . RN- Disabled    Social History Main Topics  . Smoking status: Former Smoker -- 1.00 packs/day for 20 years    Types: Cigarettes    Quit date: 10/05/1987  . Smokeless tobacco: Never Used  . Alcohol Use: No  . Drug Use: No  . Sexual Activity:    Partners: Male    Birth Control/ Protection: Post-menopausal   Other Topics Concern  . None   Social History Narrative   Review of Systems  Constitutional: Negative for fever.  HENT: Positive for sinus pressure, sore throat and voice change.   Respiratory: Positive for cough.    Objective:  BP 112/62 mmHg  Pulse 66  Temp(Src) 98 F (36.7 C) (Oral)  Ht 5\' 7"  (1.702 m)  Wt 130 lb 8 oz (59.194 kg)  BMI 20.43 kg/m2  SpO2 97%  BP/Weight 01/19/2016 09/25/2015 [id number]  Systolic BP [id number]  Diastolic BP 62 60 64  Wt. (Lbs) 130.5 - 129.5  BMI 20.43 - 20.28   Physical Exam  Constitutional: She is oriented to person, place, and time. She appears well-developed.  Appears sick/ill.  HENT:  Head:  Normocephalic and atraumatic.  Mouth/Throat: Oropharynx is clear and moist.  Severe maxillary sinus tenderness to palpation.  Eyes: Conjunctivae are normal. No scleral icterus.  Cardiovascular: Normal rate and regular rhythm.   Pulmonary/Chest: Effort normal. She has no wheezes. She has no rales.  Neurological: She is alert and oriented to person, place, and time.  Vitals reviewed.  Lab Results  Component Value Date   WBC 4.8 10/14/2015   HGB 13.7 10/14/2015   HCT 41.2 10/14/2015   PLT 167.0 10/14/2015   GLUCOSE 81 10/14/2015   CHOL 225* 01/16/2015   TRIG 104.0 01/16/2015   HDL 79.90 01/16/2015   LDLDIRECT 134.7 04/19/2013   LDLCALC 124* 01/16/2015   ALT 20 10/14/2015   AST 33 10/14/2015   NA 142 10/14/2015   K 3.4* 10/14/2015   CL 102 10/14/2015   CREATININE 0.83 10/14/2015   BUN 16 10/14/2015   CO2 30 10/14/2015   TSH 2.57 07/28/2015   INR 0.85 05/16/2012   MICROALBUR 0.3 11/01/2012   Assessment & Plan:   Problem List Items Addressed This Visit    Sinusitis, chronic - Primary    Established problem, worsening/acute exacerbation. Treating with Levaquin. Also will try alternative nasal steroid.      Relevant Medications   levofloxacin (LEVAQUIN) 500 MG tablet   Beclomethasone Dipropionate 80 MCG/ACT AERS  Meds ordered this encounter  Medications  . levofloxacin (LEVAQUIN) 500 MG tablet    Sig: Take 1 tablet (500 mg total) by mouth daily.    Dispense:  10 tablet    Refill:  0  . Beclomethasone Dipropionate 80 MCG/ACT AERS    Sig: One spray in each nostril twice daily.    Dispense:  8.7 g    Refill:  6    Follow-up: PRN  [place]

## 2016-01-19 NOTE — Patient Instructions (Signed)
Take the antibiotic as prescribed.  Follow up as needed.  Take care  Dr. Kastiel Simonian  

## 2016-01-19 NOTE — Progress Notes (Signed)
Pre visit review using our clinic review tool, if applicable. No additional management support is needed unless otherwise documented below in the visit note. 

## 2016-01-19 NOTE — Assessment & Plan Note (Signed)
Established problem, worsening/acute exacerbation. Treating with Levaquin. Also will try alternative nasal steroid.

## 2016-01-20 DIAGNOSIS — G894 Chronic pain syndrome: Secondary | ICD-10-CM | POA: Diagnosis not present

## 2016-01-20 DIAGNOSIS — M961 Postlaminectomy syndrome, not elsewhere classified: Secondary | ICD-10-CM | POA: Diagnosis not present

## 2016-01-20 DIAGNOSIS — G4700 Insomnia, unspecified: Secondary | ICD-10-CM | POA: Diagnosis not present

## 2016-01-20 DIAGNOSIS — Z79891 Long term (current) use of opiate analgesic: Secondary | ICD-10-CM | POA: Diagnosis not present

## 2016-02-02 DIAGNOSIS — M4712 Other spondylosis with myelopathy, cervical region: Secondary | ICD-10-CM | POA: Diagnosis not present

## 2016-02-04 DIAGNOSIS — M7062 Trochanteric bursitis, left hip: Secondary | ICD-10-CM | POA: Diagnosis not present

## 2016-02-04 DIAGNOSIS — M7061 Trochanteric bursitis, right hip: Secondary | ICD-10-CM | POA: Diagnosis not present

## 2016-02-05 DIAGNOSIS — M4712 Other spondylosis with myelopathy, cervical region: Secondary | ICD-10-CM | POA: Diagnosis not present

## 2016-02-06 DIAGNOSIS — M79604 Pain in right leg: Secondary | ICD-10-CM | POA: Diagnosis not present

## 2016-02-06 DIAGNOSIS — Z4782 Encounter for orthopedic aftercare following scoliosis surgery: Secondary | ICD-10-CM | POA: Diagnosis not present

## 2016-02-06 DIAGNOSIS — M545 Low back pain: Secondary | ICD-10-CM | POA: Diagnosis not present

## 2016-02-10 ENCOUNTER — Ambulatory Visit
Admission: RE | Admit: 2016-02-10 | Discharge: 2016-02-10 | Disposition: A | Discharge status: Home / Self Care | Payer: Medicare Other | Source: Ambulatory Visit | Attending: Anesthesiology | Admitting: Anesthesiology

## 2016-02-10 ENCOUNTER — Other Ambulatory Visit: Payer: Self-pay | Admitting: Anesthesiology

## 2016-02-10 DIAGNOSIS — M5441 Lumbago with sciatica, right side: Secondary | ICD-10-CM

## 2016-02-10 DIAGNOSIS — G894 Chronic pain syndrome: Secondary | ICD-10-CM | POA: Diagnosis not present

## 2016-02-10 DIAGNOSIS — M5442 Lumbago with sciatica, left side: Principal | ICD-10-CM

## 2016-02-10 DIAGNOSIS — M961 Postlaminectomy syndrome, not elsewhere classified: Secondary | ICD-10-CM | POA: Diagnosis not present

## 2016-02-10 DIAGNOSIS — M4326 Fusion of spine, lumbar region: Secondary | ICD-10-CM | POA: Diagnosis not present

## 2016-02-10 DIAGNOSIS — G4700 Insomnia, unspecified: Secondary | ICD-10-CM | POA: Diagnosis not present

## 2016-02-10 DIAGNOSIS — Z79891 Long term (current) use of opiate analgesic: Secondary | ICD-10-CM | POA: Diagnosis not present

## 2016-02-11 DIAGNOSIS — M4712 Other spondylosis with myelopathy, cervical region: Secondary | ICD-10-CM | POA: Diagnosis not present

## 2016-02-13 DIAGNOSIS — M4712 Other spondylosis with myelopathy, cervical region: Secondary | ICD-10-CM | POA: Diagnosis not present

## 2016-02-16 DIAGNOSIS — M4712 Other spondylosis with myelopathy, cervical region: Secondary | ICD-10-CM | POA: Diagnosis not present

## 2016-02-17 ENCOUNTER — Telehealth: Payer: Self-pay | Admitting: Internal Medicine

## 2016-02-17 NOTE — Telephone Encounter (Signed)
Spoke with the patient, PA completed on cover my meds, pending outcome.

## 2016-02-17 NOTE — Telephone Encounter (Signed)
Pt called about the Rx that Dr Lacinda Axon prescribed needs a prior [place]. Rx was for the nose it was a spray. Pt not sure of the name of the Rx. Call pt @ [phone]. Pt was seen on 04/17 for a possible sinus infection. Thank you!

## 2016-02-18 DIAGNOSIS — M4712 Other spondylosis with myelopathy, cervical region: Secondary | ICD-10-CM | POA: Diagnosis not present

## 2016-02-18 NOTE — Telephone Encounter (Signed)
Qnasal  Was denied, non-formulary on [employer]'s list.  Please advise?

## 2016-02-19 NOTE — Telephone Encounter (Signed)
She would like to try something else, thanks

## 2016-02-19 NOTE — Telephone Encounter (Signed)
We can try another if she likes.

## 2016-02-19 NOTE — Telephone Encounter (Signed)
Left VM for patient to return my call

## 2016-02-20 ENCOUNTER — Other Ambulatory Visit: Payer: Self-pay | Admitting: Family Medicine

## 2016-02-20 MED ORDER — AZELASTINE-FLUTICASONE 137-50 MCG/ACT NA SUSP: NASAL | Status: DC

## 2016-02-20 NOTE — Telephone Encounter (Signed)
Rx sent 

## 2016-02-23 ENCOUNTER — Other Ambulatory Visit: Payer: Self-pay | Admitting: Internal Medicine

## 2016-02-23 DIAGNOSIS — M4712 Other spondylosis with myelopathy, cervical region: Secondary | ICD-10-CM | POA: Diagnosis not present

## 2016-02-23 MED ORDER — MOMETASONE FUROATE 50 MCG/ACT NA SUSP: 2.0000 | Freq: Every day | NASAL | Status: DC

## 2016-02-23 NOTE — Progress Notes (Signed)
Cannot find an ointment in beclomethasone.

## 2016-02-24 DIAGNOSIS — M4712 Other spondylosis with myelopathy, cervical region: Secondary | ICD-10-CM | POA: Diagnosis not present

## 2016-02-26 DIAGNOSIS — M4712 Other spondylosis with myelopathy, cervical region: Secondary | ICD-10-CM | POA: Diagnosis not present

## 2016-03-02 ENCOUNTER — Telehealth: Payer: Self-pay | Admitting: Internal Medicine

## 2016-03-02 ENCOUNTER — Other Ambulatory Visit: Payer: Self-pay | Admitting: Family Medicine

## 2016-03-02 MED ORDER — IPRATROPIUM BROMIDE 0.06 % NA SOLN
2.0000 | Freq: Four times a day (QID) | NASAL | Status: DC
Start: 2016-03-02 — End: 2017-03-23

## 2016-03-02 NOTE — Telephone Encounter (Signed)
Please advise, she called the insurance and they stated that they will cover:  Flunisoliade, Ipraprodium-bromide or aselastine.  Can you prescribe any of these as the last two have not been covered. thanks

## 2016-03-02 NOTE — Telephone Encounter (Signed)
Pt called about her [employer] medication PA keeps denying due to it not being on their formulary. Pt did get other medication options to try. Pt has list of medications. Pt wants to keep trying she states she has had a sinus headache and pressure for about 3 months.   Pt can be reached after 1:30pm.@ [phone]. Thank you!

## 2016-03-02 NOTE — Telephone Encounter (Signed)
Rx for Ipratropium was sent.

## 2016-03-09 DIAGNOSIS — Z79891 Long term (current) use of opiate analgesic: Secondary | ICD-10-CM | POA: Diagnosis not present

## 2016-03-09 DIAGNOSIS — G4700 Insomnia, unspecified: Secondary | ICD-10-CM | POA: Diagnosis not present

## 2016-03-09 DIAGNOSIS — M4712 Other spondylosis with myelopathy, cervical region: Secondary | ICD-10-CM | POA: Diagnosis not present

## 2016-03-09 DIAGNOSIS — M961 Postlaminectomy syndrome, not elsewhere classified: Secondary | ICD-10-CM | POA: Diagnosis not present

## 2016-03-09 DIAGNOSIS — G894 Chronic pain syndrome: Secondary | ICD-10-CM | POA: Diagnosis not present

## 2016-03-15 ENCOUNTER — Telehealth: Payer: Self-pay | Admitting: Internal Medicine

## 2016-03-15 ENCOUNTER — Other Ambulatory Visit: Payer: Self-pay | Admitting: Internal Medicine

## 2016-03-15 MED ORDER — TORSEMIDE 5 MG PO TABS: 5.0000 mg | ORAL_TABLET | Freq: Every day | ORAL | Status: DC | PRN

## 2016-03-15 NOTE — Telephone Encounter (Signed)
Rx sent. Patient is aware.  

## 2016-03-15 NOTE — Telephone Encounter (Signed)
Pt is requesting a refill on torsemide (DEMADEX) 5 MG tablet. Pharmacy is [facility] on [address]. Please call pt's husband's cell phone and leave a message when called in [phone]. Pt's cell phone does not work.

## 2016-03-15 NOTE — Telephone Encounter (Signed)
Looks like pulmonology filled this the last time.  Okay to refill?

## 2016-03-15 NOTE — Telephone Encounter (Signed)
Ok to fill 

## 2016-03-16 DIAGNOSIS — M7061 Trochanteric bursitis, right hip: Secondary | ICD-10-CM | POA: Diagnosis not present

## 2016-03-16 DIAGNOSIS — Z96651 Presence of right artificial knee joint: Secondary | ICD-10-CM | POA: Diagnosis not present

## 2016-03-16 DIAGNOSIS — M25562 Pain in left knee: Secondary | ICD-10-CM | POA: Diagnosis not present

## 2016-03-18 DIAGNOSIS — M4712 Other spondylosis with myelopathy, cervical region: Secondary | ICD-10-CM | POA: Diagnosis not present

## 2016-03-23 ENCOUNTER — Telehealth: Payer: Self-pay | Admitting: Internal Medicine

## 2016-03-23 NOTE — Telephone Encounter (Signed)
Please advise 

## 2016-03-23 NOTE — Telephone Encounter (Signed)
Patient stated the MD prescribed for her while in rehab for back surgery (Mobic) wanted to know if you would fill?

## 2016-03-23 NOTE — Telephone Encounter (Signed)
Pt called needing a prescription for Meloxicam. Pt was given that Rx at the Rehab at [facility] when she had back surgery.  Call pt @ [phone]. Thank you!  Pharmacy is [facility], [place].

## 2016-03-24 MED ORDER — MELOXICAM 15 MG PO TABS: 15.0000 mg | ORAL_TABLET | Freq: Every day | ORAL | Status: DC

## 2016-03-24 NOTE — Telephone Encounter (Signed)
Left detailed message for patient.

## 2016-03-24 NOTE — Telephone Encounter (Signed)
Yes, [facility]

## 2016-04-05 ENCOUNTER — Telehealth: Payer: Self-pay | Admitting: Internal Medicine

## 2016-04-05 NOTE — Telephone Encounter (Signed)
Patient going to  Pain management.

## 2016-04-05 NOTE — Telephone Encounter (Signed)
If the melosicam is not working,  She will need to see pain management

## 2016-04-05 NOTE — Telephone Encounter (Signed)
Patient stated she is seeing pain management for the issues with her back, but since you have treated her with the meloxicam would you rather her see for the meloxicam not working or pain management?t

## 2016-04-07 DIAGNOSIS — Z79891 Long term (current) use of opiate analgesic: Secondary | ICD-10-CM | POA: Diagnosis not present

## 2016-04-07 DIAGNOSIS — G4700 Insomnia, unspecified: Secondary | ICD-10-CM | POA: Diagnosis not present

## 2016-04-07 DIAGNOSIS — G894 Chronic pain syndrome: Secondary | ICD-10-CM | POA: Diagnosis not present

## 2016-04-07 DIAGNOSIS — M961 Postlaminectomy syndrome, not elsewhere classified: Secondary | ICD-10-CM | POA: Diagnosis not present

## 2016-04-08 DIAGNOSIS — M4712 Other spondylosis with myelopathy, cervical region: Secondary | ICD-10-CM | POA: Diagnosis not present

## 2016-04-23 ENCOUNTER — Other Ambulatory Visit: Payer: Self-pay | Admitting: Physician Assistant

## 2016-04-23 ENCOUNTER — Encounter (INDEPENDENT_AMBULATORY_CARE_PROVIDER_SITE_OTHER): Payer: Self-pay

## 2016-04-23 ENCOUNTER — Ambulatory Visit
Admission: RE | Admit: 2016-04-23 | Discharge: 2016-04-23 | Disposition: A | Discharge status: Home / Self Care | Payer: Medicare Other | Source: Ambulatory Visit | Attending: Physician Assistant | Admitting: Physician Assistant

## 2016-04-23 DIAGNOSIS — Z4782 Encounter for orthopedic aftercare following scoliosis surgery: Secondary | ICD-10-CM

## 2016-04-23 DIAGNOSIS — M47816 Spondylosis without myelopathy or radiculopathy, lumbar region: Secondary | ICD-10-CM | POA: Diagnosis not present

## 2016-04-29 ENCOUNTER — Other Ambulatory Visit: Payer: Self-pay | Admitting: Internal Medicine

## 2016-04-29 ENCOUNTER — Telehealth: Payer: Self-pay | Admitting: Internal Medicine

## 2016-04-29 NOTE — Telephone Encounter (Signed)
LOV for chronic problems was 09/24/2015. Renaldo Fiddler, CMA

## 2016-04-29 NOTE — Telephone Encounter (Signed)
Pt is requesting a refill on her estradiol (ESTRACE) 1 MG tablet. Pharmacy is [facility] on [address].

## 2016-04-29 NOTE — Telephone Encounter (Signed)
Sent to pharmacy in another encounter, thanks

## 2016-05-03 DIAGNOSIS — Z85828 Personal history of other malignant neoplasm of skin: Secondary | ICD-10-CM | POA: Diagnosis not present

## 2016-05-03 DIAGNOSIS — L719 Rosacea, unspecified: Secondary | ICD-10-CM | POA: Diagnosis not present

## 2016-05-03 DIAGNOSIS — D225 Melanocytic nevi of trunk: Secondary | ICD-10-CM | POA: Diagnosis not present

## 2016-05-03 DIAGNOSIS — L821 Other seborrheic keratosis: Secondary | ICD-10-CM | POA: Diagnosis not present

## 2016-05-03 DIAGNOSIS — D239 Other benign neoplasm of skin, unspecified: Secondary | ICD-10-CM | POA: Diagnosis not present

## 2016-05-05 DIAGNOSIS — G894 Chronic pain syndrome: Secondary | ICD-10-CM | POA: Diagnosis not present

## 2016-05-05 DIAGNOSIS — G4700 Insomnia, unspecified: Secondary | ICD-10-CM | POA: Diagnosis not present

## 2016-05-05 DIAGNOSIS — M961 Postlaminectomy syndrome, not elsewhere classified: Secondary | ICD-10-CM | POA: Diagnosis not present

## 2016-05-05 DIAGNOSIS — Z79891 Long term (current) use of opiate analgesic: Secondary | ICD-10-CM | POA: Diagnosis not present

## 2016-05-26 ENCOUNTER — Telehealth: Payer: Self-pay | Admitting: *Deleted

## 2016-05-26 NOTE — Telephone Encounter (Signed)
"  I need to reschedule my surgery.  I had to cancel back in the Spring.  Please give me a call."

## 2016-05-26 NOTE — Telephone Encounter (Signed)
"  I'm a patient of Dr. Milinda Pointer.  I had Hammer Toe surgery a while back.  I have postponed the surgery to have the screws taken out of my toes.  My toes are very painful, they're standing straight up and rubbing my shoes.  They're very painful.  I had major back surgery and was not able to do it in the Spring.  So, I need a surgery date sometime after the 24, which is a Sunday in September to get that taken care of."

## 2016-05-28 ENCOUNTER — Other Ambulatory Visit: Payer: Self-pay

## 2016-06-02 DIAGNOSIS — M1288 Other specific arthropathies, not elsewhere classified, other specified site: Secondary | ICD-10-CM | POA: Diagnosis not present

## 2016-06-02 DIAGNOSIS — M542 Cervicalgia: Secondary | ICD-10-CM | POA: Diagnosis not present

## 2016-06-02 NOTE — Telephone Encounter (Signed)
I called and left patient a message to call and schedule an appointment with Dr. Milinda Pointer for a consultation.  Consent form was signed on September 25, 2015, needs to be updated."

## 2016-06-08 DIAGNOSIS — M961 Postlaminectomy syndrome, not elsewhere classified: Secondary | ICD-10-CM | POA: Diagnosis not present

## 2016-06-08 DIAGNOSIS — G894 Chronic pain syndrome: Secondary | ICD-10-CM | POA: Diagnosis not present

## 2016-06-08 DIAGNOSIS — G4700 Insomnia, unspecified: Secondary | ICD-10-CM | POA: Diagnosis not present

## 2016-06-08 DIAGNOSIS — Z79891 Long term (current) use of opiate analgesic: Secondary | ICD-10-CM | POA: Diagnosis not present

## 2016-06-10 DIAGNOSIS — M7061 Trochanteric bursitis, right hip: Secondary | ICD-10-CM | POA: Diagnosis not present

## 2016-06-10 DIAGNOSIS — M7062 Trochanteric bursitis, left hip: Secondary | ICD-10-CM | POA: Diagnosis not present

## 2016-06-10 DIAGNOSIS — Z96652 Presence of left artificial knee joint: Secondary | ICD-10-CM | POA: Diagnosis not present

## 2016-06-10 DIAGNOSIS — M160 Bilateral primary osteoarthritis of hip: Secondary | ICD-10-CM | POA: Diagnosis not present

## 2016-06-10 DIAGNOSIS — M25562 Pain in left knee: Secondary | ICD-10-CM | POA: Diagnosis not present

## 2016-06-11 ENCOUNTER — Other Ambulatory Visit: Payer: Self-pay | Admitting: Orthopedic Surgery

## 2016-06-11 DIAGNOSIS — M25552 Pain in left hip: Secondary | ICD-10-CM

## 2016-06-14 ENCOUNTER — Other Ambulatory Visit: Payer: Self-pay | Admitting: Orthopedic Surgery

## 2016-06-14 DIAGNOSIS — M7061 Trochanteric bursitis, right hip: Secondary | ICD-10-CM

## 2016-06-15 ENCOUNTER — Ambulatory Visit
Admission: RE | Admit: 2016-06-15 | Discharge: 2016-06-15 | Disposition: A | Discharge status: Home / Self Care | Payer: Medicare Other | Source: Ambulatory Visit | Attending: Orthopedic Surgery | Admitting: Orthopedic Surgery

## 2016-06-15 DIAGNOSIS — M1611 Unilateral primary osteoarthritis, right hip: Secondary | ICD-10-CM | POA: Diagnosis not present

## 2016-06-15 DIAGNOSIS — M7061 Trochanteric bursitis, right hip: Secondary | ICD-10-CM

## 2016-06-29 DIAGNOSIS — Z23 Encounter for immunization: Secondary | ICD-10-CM | POA: Diagnosis not present

## 2016-07-05 ENCOUNTER — Telehealth: Payer: Self-pay | Admitting: Internal Medicine

## 2016-07-05 ENCOUNTER — Encounter: Payer: Self-pay | Admitting: Internal Medicine

## 2016-07-05 ENCOUNTER — Ambulatory Visit (INDEPENDENT_AMBULATORY_CARE_PROVIDER_SITE_OTHER): Payer: Medicare Other | Admitting: Internal Medicine

## 2016-07-05 DIAGNOSIS — J3089 Other allergic rhinitis: Secondary | ICD-10-CM

## 2016-07-05 DIAGNOSIS — N3642 Intrinsic sphincter deficiency (ISD): Secondary | ICD-10-CM | POA: Diagnosis not present

## 2016-07-05 DIAGNOSIS — R32 Unspecified urinary incontinence: Secondary | ICD-10-CM

## 2016-07-05 DIAGNOSIS — J0101 Acute recurrent maxillary sinusitis: Secondary | ICD-10-CM

## 2016-07-05 MED ORDER — MONTELUKAST SODIUM 10 MG PO TABS: 10.0000 mg | ORAL_TABLET | Freq: Every day | ORAL | 3 refills | Status: DC

## 2016-07-05 MED ORDER — LEVOFLOXACIN 500 MG PO TABS: 500.0000 mg | ORAL_TABLET | Freq: Every day | ORAL | 0 refills | Status: DC

## 2016-07-05 MED ORDER — PREDNISONE 10 MG PO TABS: 10.0000 mg | ORAL_TABLET | Freq: Every day | ORAL | 0 refills | Status: DC

## 2016-07-05 NOTE — Progress Notes (Signed)
Pre-visit discussion using our clinic review tool. No additional management support is needed unless otherwise documented below in the visit note.  

## 2016-07-05 NOTE — Telephone Encounter (Signed)
Pt called about having a Sinus infection/green mucus/teeth hurt/face pain. Pt states she has had a sinus infection about 6 times this year. Pt does not want to see any other provider. Please advise?  Call pt @ [phone]. Thank you!

## 2016-07-05 NOTE — Patient Instructions (Signed)
I am treating you for sinusitis/otitis which is a complication from your persistent sinus congestion.   I am prescribing an antibiotic (levaquin) and a prednisone taper  To manage the infection and the inflammation in your ear/sinuses.   I also advise use of the following OTC meds to help with your other symptoms.   Take generic OTC benadryl 25 mg every 8 hours for the drainage,  Sudafed PE  10 to 30 mg every 8 hours for the congestion, you may substitute Afrin nasal spray for the nighttime dose of sudafed PE  If needed to prevent insomnia.  flush your sinuses twice daily with Neilmed's Sinus Rinse (do over the sink because if you do it right you will spit out globs of mucus)  Adding daily Singulair to your antihistamine for persistent allergies

## 2016-07-05 NOTE — Progress Notes (Signed)
Subjective:  Patient ID: Sonya Cordova, female    DOB: 05/04/1946  Age: 70 y.o. MRN: [id number]  CC: Diagnoses of Acute recurrent maxillary sinusitis, Urinary incontinence due to urethral sphincter incompetence, and Chronic nonseasonal allergic rhinitis due to pollen were pertinent to this visit.  HPI Sonya Cordova presents for recurrent sinusitis,  She was treated for maxillary sinusitis  in April with levaquin and steroid nasal spray and her symptoms resolved . However she reports that her symptoms returned about 5 days ago after several weeks of congestion,  And rhinitis secondary to presumed allergic rhinitis.  She reports headache,  Maxillary Facial painand purulent rhinirrohea.  She uses an antihistamine and atrovent nasal spray regularly  No post nasal drip or cough.  She is concerned about untreated infection interfering with plans by Dr Jonelle Sports to place a spinal stimulator in her thoracic spine in 2 weeks   Outpatient Medications Prior to Visit  Medication Sig Dispense Refill  . estradiol (ESTRACE) 1 MG tablet TAKE ONE TABLET BY MOUTH ONCE DAILY 90 tablet 0  . Eszopiclone (ESZOPICLONE) 3 MG TABS Take 3 mg by mouth at bedtime. Take immediately before bedtime. Name Brand ONLY    . gabapentin (NEURONTIN) 100 MG capsule Take 300 mg by mouth 3 (three) times daily.    Marland Kitchen ibuprofen (ADVIL,MOTRIN) 600 MG tablet Take 600 mg by mouth as needed.    Marland Kitchen ipratropium (ATROVENT) 0.06 % nasal spray Place 2 sprays into both nostrils 4 (four) times daily. 15 mL 12  . methocarbamol (ROBAXIN) 500 MG tablet Take 500 mg by mouth Every 6 hours.     Marland Kitchen morphine (MSIR) 30 MG tablet Take 60 mg by mouth every 6 (six) hours as needed (takes 3xdaily). pain    . Multiple Vitamins-Minerals (MULTIVITAMIN WITH MINERALS) tablet Take 1 tablet by mouth daily.    . naloxegol oxalate (MOVANTIK) 25 MG TABS tablet Take 25 mg by mouth daily as needed.    . polyethylene glycol (MIRALAX / GLYCOLAX) packet Take 17 g by mouth  as needed. constipation    . potassium chloride (K-DUR,KLOR-CON) 10 MEQ tablet Take 10 mEq by mouth 2 (two) times daily as needed. With torsemide usage    . SYNTHROID 125 MCG tablet TAKE ONE TABLET BY MOUTH ONCE DAILY 30 tablet 11  . tiZANidine (ZANAFLEX) 4 MG tablet Take 1 tablet (4 mg total) by mouth every 6 (six) hours as needed for muscle spasms. 90 tablet 2  . torsemide (DEMADEX) 5 MG tablet Take 1 tablet (5 mg total) by mouth daily as needed. 90 tablet 0  . levofloxacin (LEVAQUIN) 500 MG tablet Take 1 tablet (500 mg total) by mouth daily. (Patient not taking: Reported on 07/05/2016) 10 tablet 0  . meloxicam (MOBIC) 15 MG tablet Take 1 tablet (15 mg total) by mouth daily. (Patient not taking: Reported on 07/05/2016) 90 tablet 1  . predniSONE (DELTASONE) 10 MG tablet 6 tablets on Day 1 , then reduce by 1 tablet daily until gone (Patient not taking: Reported on 07/05/2016) 21 tablet 0  . Tdap (BOOSTRIX) 5-2.5-18.5 LF-MCG/0.5 injection Inject 0.5 mLs into the muscle once. (Patient not taking: Reported on 07/05/2016) 0.5 mL 0  . Azelastine-Fluticasone 137-50 MCG/ACT SUSP 1 spray per nostril twice daily. 23 g 0  . Beclomethasone Dipropionate 80 MCG/ACT AERS One spray in each nostril twice daily. 8.7 g 6  . Calcium Polycarbophil (FIBER-CAPS PO) Take by mouth.    . conjugated estrogens (PREMARIN) vaginal cream Place 1 Applicatorful  vaginally daily. For two weeks,  The reduce use to twice weekly 42.5 g 12  . diazepam (VALIUM) 5 MG tablet Take 1 tablet (5 mg total) by mouth every 12 (twelve) hours as needed for anxiety. 30 tablet 5  . fluticasone (FLONASE) 50 MCG/ACT nasal spray Place 2 sprays into both nostrils daily.    Marland Kitchen levofloxacin (LEVAQUIN) 500 MG tablet Take 1 tablet (500 mg total) by mouth daily. 7 tablet 0  . mometasone (NASONEX) 50 MCG/ACT nasal spray Place 2 sprays into the nose daily. 17 g 12   No facility-administered medications prior to visit.     Review of Systems;  Patient denies ,  malaise, unintentional weight loss, skin rash, eye pain, sinus congestion and sinus pain, sore throat, dysphagia,  hemoptysis , cough, dyspnea, wheezing, chest pain, palpitations, orthopnea, edema, abdominal pain, nausea, melena, diarrhea, constipation, flank pain, dysuria, hematuria, urinary  Frequency, nocturia, numbness, tingling, seizures,  Focal weakness, Loss of consciousness,  Tremor, insomnia, depression, anxiety, and suicidal ideation.      Objective:  BP 126/66   Pulse 74   Temp 97.9 F (36.6 C) (Oral)   Resp 10   Ht 5' 6.75" (1.695 m)   Wt 136 lb (61.7 kg)   SpO2 95%   BMI 21.46 kg/m   BP Readings from Last 3 Encounters:  07/05/16 126/66  01/19/16 112/62  09/25/15 103/60    Wt Readings from Last 3 Encounters:  07/05/16 136 lb (61.7 kg)  01/19/16 130 lb 8 oz (59.2 kg)  09/24/15 129 lb 8 oz (58.7 kg)    General appearance: alert, cooperative and appears stated age Ears: normal TM's and external ear canals both ears Throat: lips, mucosa, and tongue normal; teeth and gums normal Neck: no adenopathy, no carotid bruit, supple, symmetrical, trachea midline and thyroid not enlarged, symmetric, no tenderness/mass/nodules Back: symmetric, no curvature. ROM normal. No CVA tenderness. Lungs: clear to auscultation bilaterally Heart: regular rate and rhythm, S1, S2 normal, no murmur, click, rub or gallop Abdomen: soft, non-tender; bowel sounds normal; no masses,  no organomegaly Pulses: 2+ and symmetric Skin: Skin color, texture, turgor normal. No rashes or lesions Lymph nodes: Cervical, supraclavicular, and axillary nodes normal.  No results found for: HGBA1C  Lab Results  Component Value Date   CREATININE 0.83 10/14/2015   CREATININE 0.86 07/28/2015   CREATININE 0.76 01/16/2015    Lab Results  Component Value Date   WBC 4.8 10/14/2015   HGB 13.7 10/14/2015   HCT 41.2 10/14/2015   PLT 167.0 10/14/2015   GLUCOSE 81 10/14/2015   CHOL 225 (H) 01/16/2015   TRIG  104.0 01/16/2015   HDL 79.90 01/16/2015   LDLDIRECT 134.7 04/19/2013   LDLCALC 124 (H) 01/16/2015   ALT 20 10/14/2015   AST 33 10/14/2015   NA 142 10/14/2015   K 3.4 (L) 10/14/2015   CL 102 10/14/2015   CREATININE 0.83 10/14/2015   BUN 16 10/14/2015   CO2 30 10/14/2015   TSH 2.57 07/28/2015   INR 0.85 05/16/2012   MICROALBUR 0.3 11/01/2012    Mr Hip Right Wo Contrast  Result Date: 06/15/2016 CLINICAL DATA:  Right hip trochanteric bursitis and arthritis with hip pain. EXAM: MR OF THE RIGHT HIP WITHOUT CONTRAST TECHNIQUE: Multiplanar, multisequence MR imaging was performed. No intravenous contrast was administered. COMPARISON:  Multiple exams, including 11/17/2005 FINDINGS: Despite efforts by the technologist and patient, motion artifact is present on today's exam and could not be eliminated. This reduces exam sensitivity and specificity. Metal  artifact related to posterolateral rod and pedicle screw fixation in the lumbar spine. Bones: No findings of avascular necrosis of the hips. No significant marrow edema identified in the visualized portions of the pelvis and right hip. Articular cartilage and labrum Articular cartilage: Moderate right and mild left degenerative chondral thinning in the hip joint. Labrum: High suspicion for tear of the anterior superior labrum on images 10 through 13 of series 7, although no paralabral cyst is visible. Joint or bursal effusion Joint effusion:  Absent Bursae: There is only trace edema tracking along along the right subcutaneous trochanteric bursa, image 22/5. Muscles and tendons Muscles and tendons:  Unremarkable Other findings Miscellaneous: 3.5 by 2.4 by 4.2 cm (volume = 18 cm^3) septated cystic lesion in the right adnexa, presumably ovarian. Uterus not seen. IMPRESSION: 1. 18 cc septated cystic lesion in the right adnexa, presumably in the right ovary, pelvic sonography recommended for further workup to exclude ovarian malignancy. Transabdominal sonography  may have difficulty visualizing this lesion due to the large amount of bowel, and transvaginal sonography will likely be helpful. 2. Small amount of right sub cutaneous trochanteric bursitis. 3. Probably torn anterior superior acetabular labrum on the right. 4. Moderate right and mild left degenerative chondral thinning in the hips. These results will be called to the ordering clinician or representative by the Radiologist Assistant, and communication documented in the PACS or zVision Dashboard. Electronically Signed   By: Van Clines M.D.   On: 06/15/2016 10:42    Assessment & Plan:   Problem List Items Addressed This Visit    Acute recurrent sinusitis    By history ,  Due to persistent symptoms of allergic rhinitis.  Levaquin x 10 days and prednisone taper prescribed today .  Continue daily probiotic      Relevant Medications   levofloxacin (LEVAQUIN) 500 MG tablet   predniSONE (DELTASONE) 10 MG tablet   Urinary incontinence due to urethral sphincter incompetence   Allergic rhinitis    pressumed by year round symptoms,  Continue daily antihistamine,  Adding Singulair,        Other Visit Diagnoses   None.   A total of 25 minutes was spent with patient more than half of which was spent in counseling patient on the above mentioned issues and , her upcoming surgery,    I have discontinued Ms. Levick's Calcium Polycarbophil (FIBER-CAPS PO), conjugated estrogens, fluticasone, diazepam, Beclomethasone Dipropionate, Azelastine-Fluticasone, and mometasone. I am also having her start on levofloxacin and predniSONE. Additionally, I am having her maintain her morphine, gabapentin, polyethylene glycol, potassium chloride, methocarbamol, Eszopiclone, ibuprofen, multivitamin with minerals, naloxegol oxalate, predniSONE, Tdap, tiZANidine, SYNTHROID, levofloxacin, ipratropium, torsemide, meloxicam, estradiol, and montelukast.  Meds ordered this encounter  Medications  . DISCONTD: montelukast  (SINGULAIR) 10 MG tablet    Sig: Take 1 tablet (10 mg total) by mouth at bedtime.    Dispense:  30 tablet    Refill:  3  . levofloxacin (LEVAQUIN) 500 MG tablet    Sig: Take 1 tablet (500 mg total) by mouth daily.    Dispense:  10 tablet    Refill:  0  . predniSONE (DELTASONE) 10 MG tablet    Sig: Take 1 tablet (10 mg total) by mouth daily with breakfast. 6 tablets all at once on Day 1,  Then taper by 1 tablet daily until gone    Dispense:  21 tablet    Refill:  0  . montelukast (SINGULAIR) 10 MG tablet    Sig: Take 1  tablet (10 mg total) by mouth at bedtime.    Dispense:  30 tablet    Refill:  3    Medications Discontinued During This Encounter  Medication Reason  . Azelastine-Fluticasone 137-50 MCG/ACT SUSP Error  . Beclomethasone Dipropionate 80 MCG/ACT AERS Error  . Calcium Polycarbophil (FIBER-CAPS PO) Error  . conjugated estrogens (PREMARIN) vaginal cream Error  . diazepam (VALIUM) 5 MG tablet Error  . fluticasone (FLONASE) 50 MCG/ACT nasal spray Error  . levofloxacin (LEVAQUIN) 500 MG tablet Completed Course  . mometasone (NASONEX) 50 MCG/ACT nasal spray Error  . montelukast (SINGULAIR) 10 MG tablet Reorder    Follow-up: No Follow-up on file.   Crecencio Mc, MD

## 2016-07-05 NOTE — Telephone Encounter (Signed)
Can I use Tuesday at 4.30?

## 2016-07-05 NOTE — Telephone Encounter (Signed)
Please schedule for 6.30 today,

## 2016-07-06 DIAGNOSIS — J309 Allergic rhinitis, unspecified: Secondary | ICD-10-CM | POA: Insufficient documentation

## 2016-07-06 NOTE — Assessment & Plan Note (Deleted)
She is scheduled to have her bladder stimulator replaced in 2 weeks.

## 2016-07-06 NOTE — Assessment & Plan Note (Signed)
By history ,  Due to persistent symptoms of allergic rhinitis.  Levaquin x 10 days and prednisone taper prescribed today .  Continue daily probiotic

## 2016-07-06 NOTE — Assessment & Plan Note (Signed)
pressumed by year round symptoms,  Continue daily antihistamine,  Adding Singulair,

## 2016-07-07 DIAGNOSIS — G894 Chronic pain syndrome: Secondary | ICD-10-CM | POA: Diagnosis not present

## 2016-07-07 DIAGNOSIS — M961 Postlaminectomy syndrome, not elsewhere classified: Secondary | ICD-10-CM | POA: Diagnosis not present

## 2016-07-07 DIAGNOSIS — G4700 Insomnia, unspecified: Secondary | ICD-10-CM | POA: Diagnosis not present

## 2016-07-07 DIAGNOSIS — Z79891 Long term (current) use of opiate analgesic: Secondary | ICD-10-CM | POA: Diagnosis not present

## 2016-07-13 DIAGNOSIS — M419 Scoliosis, unspecified: Secondary | ICD-10-CM | POA: Diagnosis not present

## 2016-07-13 DIAGNOSIS — F329 Major depressive disorder, single episode, unspecified: Secondary | ICD-10-CM | POA: Diagnosis not present

## 2016-07-13 DIAGNOSIS — Z79899 Other long term (current) drug therapy: Secondary | ICD-10-CM | POA: Diagnosis not present

## 2016-07-13 DIAGNOSIS — Z7952 Long term (current) use of systemic steroids: Secondary | ICD-10-CM | POA: Diagnosis not present

## 2016-07-13 DIAGNOSIS — M4325 Fusion of spine, thoracolumbar region: Secondary | ICD-10-CM | POA: Diagnosis not present

## 2016-07-13 DIAGNOSIS — Z791 Long term (current) use of non-steroidal anti-inflammatories (NSAID): Secondary | ICD-10-CM | POA: Diagnosis not present

## 2016-07-13 DIAGNOSIS — M5137 Other intervertebral disc degeneration, lumbosacral region: Secondary | ICD-10-CM | POA: Diagnosis not present

## 2016-07-13 DIAGNOSIS — J449 Chronic obstructive pulmonary disease, unspecified: Secondary | ICD-10-CM | POA: Diagnosis not present

## 2016-07-13 DIAGNOSIS — Z01818 Encounter for other preprocedural examination: Secondary | ICD-10-CM | POA: Diagnosis not present

## 2016-07-19 DIAGNOSIS — Z981 Arthrodesis status: Secondary | ICD-10-CM | POA: Diagnosis not present

## 2016-07-19 DIAGNOSIS — M961 Postlaminectomy syndrome, not elsewhere classified: Secondary | ICD-10-CM | POA: Diagnosis not present

## 2016-07-19 DIAGNOSIS — Z885 Allergy status to narcotic agent status: Secondary | ICD-10-CM | POA: Diagnosis not present

## 2016-07-19 DIAGNOSIS — G8929 Other chronic pain: Secondary | ICD-10-CM | POA: Diagnosis not present

## 2016-07-19 DIAGNOSIS — Z881 Allergy status to other antibiotic agents status: Secondary | ICD-10-CM | POA: Diagnosis not present

## 2016-07-19 DIAGNOSIS — Z79899 Other long term (current) drug therapy: Secondary | ICD-10-CM | POA: Diagnosis not present

## 2016-07-19 DIAGNOSIS — M5431 Sciatica, right side: Secondary | ICD-10-CM | POA: Diagnosis not present

## 2016-07-19 DIAGNOSIS — Z880 Allergy status to penicillin: Secondary | ICD-10-CM | POA: Diagnosis not present

## 2016-07-19 DIAGNOSIS — F329 Major depressive disorder, single episode, unspecified: Secondary | ICD-10-CM | POA: Diagnosis not present

## 2016-07-19 DIAGNOSIS — Z7952 Long term (current) use of systemic steroids: Secondary | ICD-10-CM | POA: Diagnosis not present

## 2016-07-19 DIAGNOSIS — M4325 Fusion of spine, thoracolumbar region: Secondary | ICD-10-CM | POA: Diagnosis not present

## 2016-07-19 DIAGNOSIS — M5137 Other intervertebral disc degeneration, lumbosacral region: Secondary | ICD-10-CM | POA: Diagnosis not present

## 2016-07-20 DIAGNOSIS — F329 Major depressive disorder, single episode, unspecified: Secondary | ICD-10-CM | POA: Diagnosis not present

## 2016-07-20 DIAGNOSIS — M5431 Sciatica, right side: Secondary | ICD-10-CM | POA: Diagnosis not present

## 2016-07-20 DIAGNOSIS — G8929 Other chronic pain: Secondary | ICD-10-CM | POA: Diagnosis not present

## 2016-07-20 DIAGNOSIS — M961 Postlaminectomy syndrome, not elsewhere classified: Secondary | ICD-10-CM | POA: Diagnosis not present

## 2016-07-20 DIAGNOSIS — M5137 Other intervertebral disc degeneration, lumbosacral region: Secondary | ICD-10-CM | POA: Diagnosis not present

## 2016-07-20 DIAGNOSIS — M4325 Fusion of spine, thoracolumbar region: Secondary | ICD-10-CM | POA: Diagnosis not present

## 2016-07-21 DIAGNOSIS — M961 Postlaminectomy syndrome, not elsewhere classified: Secondary | ICD-10-CM | POA: Diagnosis not present

## 2016-07-21 DIAGNOSIS — M5431 Sciatica, right side: Secondary | ICD-10-CM | POA: Diagnosis not present

## 2016-07-21 DIAGNOSIS — M4325 Fusion of spine, thoracolumbar region: Secondary | ICD-10-CM | POA: Diagnosis not present

## 2016-07-21 DIAGNOSIS — F329 Major depressive disorder, single episode, unspecified: Secondary | ICD-10-CM | POA: Diagnosis not present

## 2016-07-21 DIAGNOSIS — M5137 Other intervertebral disc degeneration, lumbosacral region: Secondary | ICD-10-CM | POA: Diagnosis not present

## 2016-07-21 DIAGNOSIS — G8929 Other chronic pain: Secondary | ICD-10-CM | POA: Diagnosis not present

## 2016-07-26 ENCOUNTER — Other Ambulatory Visit: Payer: Self-pay

## 2016-07-26 MED ORDER — ESTRADIOL 1 MG PO TABS: 1.0000 mg | ORAL_TABLET | Freq: Every day | ORAL | 1 refills | Status: DC

## 2016-07-27 ENCOUNTER — Other Ambulatory Visit: Payer: Self-pay | Admitting: *Deleted

## 2016-07-27 MED ORDER — ESTRADIOL 1 MG PO TABS: 1.0000 mg | ORAL_TABLET | Freq: Every day | ORAL | 1 refills | Status: DC

## 2016-08-04 DIAGNOSIS — K5900 Constipation, unspecified: Secondary | ICD-10-CM | POA: Diagnosis not present

## 2016-08-04 DIAGNOSIS — M4155 Other secondary scoliosis, thoracolumbar region: Secondary | ICD-10-CM | POA: Diagnosis not present

## 2016-08-04 DIAGNOSIS — G894 Chronic pain syndrome: Secondary | ICD-10-CM | POA: Diagnosis not present

## 2016-08-04 DIAGNOSIS — M62838 Other muscle spasm: Secondary | ICD-10-CM | POA: Diagnosis not present

## 2016-08-04 DIAGNOSIS — Z79891 Long term (current) use of opiate analgesic: Secondary | ICD-10-CM | POA: Diagnosis not present

## 2016-08-04 DIAGNOSIS — G4700 Insomnia, unspecified: Secondary | ICD-10-CM | POA: Diagnosis not present

## 2016-08-04 DIAGNOSIS — M961 Postlaminectomy syndrome, not elsewhere classified: Secondary | ICD-10-CM | POA: Diagnosis not present

## 2016-08-30 ENCOUNTER — Telehealth: Payer: Self-pay

## 2016-08-30 DIAGNOSIS — E559 Vitamin D deficiency, unspecified: Secondary | ICD-10-CM

## 2016-08-30 DIAGNOSIS — Z79899 Other long term (current) drug therapy: Secondary | ICD-10-CM

## 2016-08-30 DIAGNOSIS — E7800 Pure hypercholesterolemia, unspecified: Secondary | ICD-10-CM

## 2016-08-30 DIAGNOSIS — E034 Atrophy of thyroid (acquired): Secondary | ICD-10-CM

## 2016-08-30 NOTE — Telephone Encounter (Signed)
Pt coming for fasting labs 08/31/16. Please place future orders. Thank you.

## 2016-08-31 ENCOUNTER — Other Ambulatory Visit (INDEPENDENT_AMBULATORY_CARE_PROVIDER_SITE_OTHER): Payer: Medicare Other

## 2016-08-31 DIAGNOSIS — E034 Atrophy of thyroid (acquired): Secondary | ICD-10-CM | POA: Diagnosis not present

## 2016-08-31 DIAGNOSIS — Z79899 Other long term (current) drug therapy: Secondary | ICD-10-CM

## 2016-08-31 DIAGNOSIS — E559 Vitamin D deficiency, unspecified: Secondary | ICD-10-CM

## 2016-08-31 DIAGNOSIS — E7800 Pure hypercholesterolemia, unspecified: Secondary | ICD-10-CM | POA: Diagnosis not present

## 2016-08-31 LAB — COMPREHENSIVE METABOLIC PANEL
ALT: 12 U/L (ref 0–35)
AST: 19 U/L (ref 0–37)
Albumin: 4 g/dL (ref 3.5–5.2)
Alkaline Phosphatase: 48 U/L (ref 39–117)
BUN: 22 mg/dL (ref 6–23)
CO2: 34 mEq/L — ABNORMAL HIGH (ref 19–32)
Calcium: 9 mg/dL (ref 8.4–10.5)
Chloride: 101 mEq/L (ref 96–112)
Creatinine, Ser: 0.75 mg/dL (ref 0.40–1.20)
GFR: 81.48 mL/min (ref >60.00)
Glucose, Bld: 78 mg/dL (ref 70–99)
Potassium: 3.6 mEq/L (ref 3.5–5.1)
Sodium: 140 mEq/L (ref 135–145)
Total Bilirubin: 0.6 mg/dL (ref 0.2–1.2)
Total Protein: 6.2 g/dL (ref 6.0–8.3)

## 2016-08-31 LAB — LIPID PANEL
Cholesterol: 189 mg/dL (ref 0–200)
HDL: 73.7 mg/dL (ref >39.00)
LDL Cholesterol: 102 mg/dL — ABNORMAL HIGH (ref 0–99)
NonHDL: 115.09
Total CHOL/HDL Ratio: 3
Triglycerides: 65 mg/dL (ref 0.0–149.0)
VLDL: 13 mg/dL (ref 0.0–40.0)

## 2016-08-31 LAB — TSH: TSH: 0.22 u[IU]/mL — ABNORMAL LOW (ref 0.35–4.50)

## 2016-08-31 LAB — VITAMIN D 25 HYDROXY (VIT D DEFICIENCY, FRACTURES): VITD: 39.69 ng/mL (ref 30.00–100.00)

## 2016-09-01 ENCOUNTER — Encounter: Payer: Self-pay | Admitting: Internal Medicine

## 2016-09-01 ENCOUNTER — Telehealth: Payer: Self-pay | Admitting: General Surgery

## 2016-09-01 ENCOUNTER — Ambulatory Visit (INDEPENDENT_AMBULATORY_CARE_PROVIDER_SITE_OTHER): Payer: Medicare Other | Admitting: Internal Medicine

## 2016-09-01 VITALS — BP 108/62 | HR 60 | Temp 97.9°F | Resp 12 | Ht 67.0 in | Wt 137.5 lb

## 2016-09-01 DIAGNOSIS — Z1231 Encounter for screening mammogram for malignant neoplasm of breast: Secondary | ICD-10-CM

## 2016-09-01 DIAGNOSIS — Z86010 Personal history of colonic polyps: Secondary | ICD-10-CM | POA: Diagnosis not present

## 2016-09-01 DIAGNOSIS — F5105 Insomnia due to other mental disorder: Secondary | ICD-10-CM

## 2016-09-01 DIAGNOSIS — F419 Anxiety disorder, unspecified: Secondary | ICD-10-CM

## 2016-09-01 DIAGNOSIS — E890 Postprocedural hypothyroidism: Secondary | ICD-10-CM

## 2016-09-01 DIAGNOSIS — Z0000 Encounter for general adult medical examination without abnormal findings: Secondary | ICD-10-CM | POA: Diagnosis not present

## 2016-09-01 DIAGNOSIS — M130 Polyarthritis, unspecified: Secondary | ICD-10-CM | POA: Diagnosis not present

## 2016-09-01 DIAGNOSIS — Z1239 Encounter for other screening for malignant neoplasm of breast: Secondary | ICD-10-CM

## 2016-09-01 LAB — CBC WITH DIFFERENTIAL/PLATELET
Basophils Absolute: 0 10*3/uL (ref 0.0–0.1)
Basophils Relative: 0.4 % (ref 0.0–3.0)
Eosinophils Absolute: 0.1 10*3/uL (ref 0.0–0.7)
Eosinophils Relative: 1 % (ref 0.0–5.0)
HCT: 36.8 % (ref 36.0–46.0)
Hemoglobin: 12.2 g/dL (ref 12.0–15.0)
Lymphocytes Relative: 21 % (ref 12.0–46.0)
Lymphs Abs: 1.1 10*3/uL (ref 0.7–4.0)
MCHC: 33.1 g/dL (ref 30.0–36.0)
MCV: 93.9 fl (ref 78.0–100.0)
Monocytes Absolute: 0.2 10*3/uL (ref 0.1–1.0)
Monocytes Relative: 4.7 % (ref 3.0–12.0)
Neutro Abs: 3.7 10*3/uL (ref 1.4–7.7)
Neutrophils Relative %: 72.9 % (ref 43.0–77.0)
Platelets: 156 10*3/uL (ref 150.0–400.0)
RBC: 3.93 Mil/uL (ref 3.87–5.11)
RDW: 13.2 % (ref 11.5–15.5)
WBC: 5.1 10*3/uL (ref 4.0–10.5)

## 2016-09-01 MED ORDER — DIAZEPAM 5 MG PO TABS: 5.0000 mg | ORAL_TABLET | Freq: Every evening | ORAL | 1 refills | Status: DC | PRN

## 2016-09-01 MED ORDER — DICLOFENAC SODIUM ER 100 MG PO TB24: 100.0000 mg | ORAL_TABLET | Freq: Every day | ORAL | 1 refills | Status: DC

## 2016-09-01 NOTE — Progress Notes (Signed)
Pre-visit discussion using our clinic review tool. No additional management support is needed unless otherwise documented below in the visit note.  

## 2016-09-01 NOTE — Assessment & Plan Note (Addendum)
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  TSH is currently suppressed.Reducing dose to 112 mcg of  synthroid name brand only. Recheck tsh I 6 weeks

## 2016-09-01 NOTE — Telephone Encounter (Signed)
09-01-16 SPOKE WITH PT & SHE IS GOING TO TALK WITH HER SPOUSE & CALL TO SCHEDULE AN APPT WITH DR BYRNETT REF FROM DR TULLO FOR COLONOSCOPY WITH HX OF COLON POLYPS./MTH

## 2016-09-01 NOTE — Patient Instructions (Addendum)
Trial of diclofenac ER 100 mg once daily instead of motrin  For stomach protection   nexium 20 or Prilosec 20 (eneric is fine)  Add 2000 mg tylenol daily in divided doses  If no improvement.  Consider cymbalta  Your thyroid dose need reducing,  Let me know what dose you are taking   Mammogram has been ordered  Health Maintenance, Female Introduction Adopting a healthy lifestyle and getting preventive care can go a long way to promote health and wellness. Talk with your health care provider about what schedule of regular examinations is right for you. This is a good chance for you to check in with your provider about disease prevention and staying healthy. In between checkups, there are plenty of things you can do on your own. Experts have done a lot of research about which lifestyle changes and preventive measures are most likely to keep you healthy. Ask your health care provider for more information. Weight and diet Eat a healthy diet  Be sure to include plenty of vegetables, fruits, low-fat dairy products, and lean protein.  Do not eat a lot of foods high in solid fats, added sugars, or salt.  Get regular exercise. This is one of the most important things you can do for your health.  Most adults should exercise for at least 150 minutes each week. The exercise should increase your heart rate and make you sweat (moderate-intensity exercise).  Most adults should also do strengthening exercises at least twice a week. This is in addition to the moderate-intensity exercise. Maintain a healthy weight  Body mass index (BMI) is a measurement that can be used to identify possible weight problems. It estimates body fat based on height and weight. Your health care provider can help determine your BMI and help you achieve or maintain a healthy weight.  For females 68 years of age and older:  A BMI below 18.5 is considered underweight.  A BMI of 18.5 to 24.9 is normal.  A BMI of 25 to 29.9  is considered overweight.  A BMI of 30 and above is considered obese. Watch levels of cholesterol and blood lipids  You should start having your blood tested for lipids and cholesterol at 68 years of age, then have this test every 5 years.  You may need to have your cholesterol levels checked more often if:  Your lipid or cholesterol levels are high.  You are older than 68 years of age.  You are at high risk for heart disease. Cancer screening Lung Cancer  Lung cancer screening is recommended for adults 11-84 years old who are at high risk for lung cancer because of a history of smoking.  A yearly low-dose CT scan of the lungs is recommended for people who:  Currently smoke.  Have quit within the past 15 years.  Have at least a 30-pack-year history of smoking. A pack year is smoking an average of one pack of cigarettes a day for 1 year.  Yearly screening should continue until it has been 15 years since you quit.  Yearly screening should stop if you develop a health problem that would prevent you from having lung cancer treatment. Breast Cancer  Practice breast self-awareness. This means understanding how your breasts normally appear and feel.  It also means doing regular breast self-exams. Let your health care provider know about any changes, no matter how small.  If you are in your 20s or 30s, you should have a clinical breast exam (CBE) by a  health care provider every 1-3 years as part of a regular health exam.  If you are 72 or older, have a CBE every year. Also consider having a breast X-ray (mammogram) every year.  If you have a family history of breast cancer, talk to your health care provider about genetic screening.  If you are at high risk for breast cancer, talk to your health care provider about having an MRI and a mammogram every year.  Breast cancer gene (BRCA) assessment is recommended for women who have family members with BRCA-related cancers. BRCA-related  cancers include:  Breast.  Ovarian.  Tubal.  Peritoneal cancers.  Results of the assessment will determine the need for genetic counseling and BRCA1 and BRCA2 testing. Cervical Cancer  Your health care provider may recommend that you be screened regularly for cancer of the pelvic organs (ovaries, uterus, and vagina). This screening involves a pelvic examination, including checking for microscopic changes to the surface of your cervix (Pap test). You may be encouraged to have this screening done every 3 years, beginning at age 67.  For women ages 63-65, health care providers may recommend pelvic exams and Pap testing every 3 years, or they may recommend the Pap and pelvic exam, combined with testing for human papilloma virus (HPV), every 5 years. Some types of HPV increase your risk of cervical cancer. Testing for HPV may also be done on women of any age with unclear Pap test results.  Other health care providers may not recommend any screening for nonpregnant women who are considered low risk for pelvic cancer and who do not have symptoms. Ask your health care provider if a screening pelvic exam is right for you.  If you have had past treatment for cervical cancer or a condition that could lead to cancer, you need Pap tests and screening for cancer for at least 20 years after your treatment. If Pap tests have been discontinued, your risk factors (such as having a new sexual partner) need to be reassessed to determine if screening should resume. Some women have medical problems that increase the chance of getting cervical cancer. In these cases, your health care provider may recommend more frequent screening and Pap tests. Colorectal Cancer  This type of cancer can be detected and often prevented.  Routine colorectal cancer screening usually begins at 68 years of age and continues through 68 years of age.  Your health care provider may recommend screening at an earlier age if you have risk  factors for colon cancer.  Your health care provider may also recommend using home test kits to check for hidden blood in the stool.  A small camera at the end of a tube can be used to examine your colon directly (sigmoidoscopy or colonoscopy). This is done to check for the earliest forms of colorectal cancer.  Routine screening usually begins at age 39.  Direct examination of the colon should be repeated every 5-10 years through 68 years of age. However, you may need to be screened more often if early forms of precancerous polyps or small growths are found. Skin Cancer  Check your skin from head to toe regularly.  Tell your health care provider about any new moles or changes in moles, especially if there is a change in a mole's shape or color.  Also tell your health care provider if you have a mole that is larger than the size of a pencil eraser.  Always use sunscreen. Apply sunscreen liberally and repeatedly throughout the day.  Protect yourself by wearing long sleeves, pants, a wide-brimmed hat, and sunglasses whenever you are outside. Heart disease, diabetes, and high blood pressure  High blood pressure causes heart disease and increases the risk of stroke. High blood pressure is more likely to develop in:  People who have blood pressure in the high end of the normal range (130-139/85-89 mm Hg).  People who are overweight or obese.  People who are African American.  If you are 66-41 years of age, have your blood pressure checked every 3-5 years. If you are 45 years of age or older, have your blood pressure checked every year. You should have your blood pressure measured twice-once when you are at a hospital or clinic, and once when you are not at a hospital or clinic. Record the average of the two measurements. To check your blood pressure when you are not at a hospital or clinic, you can use:  An automated blood pressure machine at a pharmacy.  A home blood pressure  monitor.  If you are between 65 years and 88 years old, ask your health care provider if you should take aspirin to prevent strokes.  Have regular diabetes screenings. This involves taking a blood sample to check your fasting blood sugar level.  If you are at a normal weight and have a low risk for diabetes, have this test once every three years after 68 years of age.  If you are overweight and have a high risk for diabetes, consider being tested at a younger age or more often. Preventing infection Hepatitis B  If you have a higher risk for hepatitis B, you should be screened for this virus. You are considered at high risk for hepatitis B if:  You were born in a country where hepatitis B is common. Ask your health care provider which countries are considered high risk.  Your parents were born in a high-risk country, and you have not been immunized against hepatitis B (hepatitis B vaccine).  You have HIV or AIDS.  You use needles to inject street drugs.  You live with someone who has hepatitis B.  You have had sex with someone who has hepatitis B.  You get hemodialysis treatment.  You take certain medicines for conditions, including cancer, organ transplantation, and autoimmune conditions. Hepatitis C  Blood testing is recommended for:  Everyone born from [birth year] through 1963.  Anyone with known risk factors for hepatitis C. Sexually transmitted infections (STIs)  You should be screened for sexually transmitted infections (STIs) including gonorrhea and chlamydia if:  You are sexually active and are younger than 68 years of age.  You are older than 68 years of age and your health care provider tells you that you are at risk for this type of infection.  Your sexual activity has changed since you were last screened and you are at an increased risk for chlamydia or gonorrhea. Ask your health care provider if you are at risk.  If you do not have HIV, but are at risk, it may be  recommended that you take a prescription medicine daily to prevent HIV infection. This is called pre-exposure prophylaxis (PrEP). You are considered at risk if:  You are sexually active and do not regularly use condoms or know the HIV status of your partner(s).  You take drugs by injection.  You are sexually active with a partner who has HIV. Talk with your health care provider about whether you are at high risk of being infected with HIV.  If you choose to begin PrEP, you should first be tested for HIV. You should then be tested every 3 months for as long as you are taking PrEP. Pregnancy  If you are premenopausal and you may become pregnant, ask your health care provider about preconception counseling.  If you may become pregnant, take 400 to 800 micrograms (mcg) of folic acid every day.  If you want to prevent pregnancy, talk to your health care provider about birth control (contraception). Osteoporosis and menopause  Osteoporosis is a disease in which the bones lose minerals and strength with aging. This can result in serious bone fractures. Your risk for osteoporosis can be identified using a bone density scan.  If you are 16 years of age or older, or if you are at risk for osteoporosis and fractures, ask your health care provider if you should be screened.  Ask your health care provider whether you should take a calcium or vitamin D supplement to lower your risk for osteoporosis.  Menopause may have certain physical symptoms and risks.  Hormone replacement therapy may reduce some of these symptoms and risks. Talk to your health care provider about whether hormone replacement therapy is right for you. Follow these instructions at home:  Schedule regular health, dental, and eye exams.  Stay current with your immunizations.  Do not use any tobacco products including cigarettes, chewing tobacco, or electronic cigarettes.  If you are pregnant, do not drink alcohol.  If you are  breastfeeding, limit how much and how often you drink alcohol.  Limit alcohol intake to no more than 1 drink per day for nonpregnant women. One drink equals 12 ounces of beer, 5 ounces of wine, or 1 ounces of hard liquor.  Do not use street drugs.  Do not share needles.  Ask your health care provider for help if you need support or information about quitting drugs.  Tell your health care provider if you often feel depressed.  Tell your health care provider if you have ever been abused or do not feel safe at home. This information is not intended to replace advice given to you by your health care provider. Make sure you discuss any questions you have with your health care provider. Document Released: 04/05/2011 Document Revised: 02/26/2016 Document Reviewed: 06/24/2015  2017 [employer]

## 2016-09-01 NOTE — Progress Notes (Signed)
Patient ID: Sonya Cordova, female    DOB: 1946/05/20  Age: 70 y.o. MRN: [id number]  The patient is here for annual  wellness examination and management of other chronic and acute problems.   Needs mammogram and 5 yr colonoscopy for history of polyps    The risk factors are reflected in the social history.  The roster of all physicians providing medical care to patient - is listed in the Snapshot section of the chart.  Activities of daily living:  The patient is 100% independent in all ADLs: dressing, toileting, feeding as well as independent mobility  Home safety : The patient has smoke detectors in the home. They wear seatbelts.  There are no firearms at home. There is no violence in the home.   There is no risks for hepatitis, STDs or HIV. There is no   history of blood transfusion. They have no travel history to infectious disease endemic areas of the world.  The patient has seen their dentist in the last six month. They have seen their eye doctor in the last year.   They have deferred audiologic testing in the last year.  They do not  have excessive sun exposure. Discussed the need for sun protection: hats, long sleeves and use of sunscreen if there is significant sun exposure.   Diet: the importance of a healthy diet is discussed. They do have a healthy diet.  The benefits of regular aerobic exercise were discussed. She walks 4 times per week ,  20 minutes.   Depression screen: there are no signs or vegative symptoms of depression- irritability, change in appetite, anhedonia, sadness/tearfullness.  Cognitive assessment: the patient manages all their financial and personal affairs and is actively engaged. They could relate day,date,year and events; recalled 2/3 objects at 3 minutes; performed clock-face test normally.  The following portions of the patient's history were reviewed and updated as appropriate: allergies, current medications, past family history, past medical history,   past surgical history, past social history  and problem list.  Visual acuity was not assessed per patient preference since she has regular follow up with her ophthalmologist. Hearing and body mass index were assessed and reviewed.   During the course of the visit the patient was educated and counseled about appropriate screening and preventive services including : fall prevention , diabetes screening, nutrition counseling, colorectal cancer screening, and recommended immunizations.    CC: The primary encounter diagnosis was History of colon polyps. Diagnoses of Postoperative hypothyroidism, Screening for breast cancer, Polyarthritis, and Insomnia secondary to anxiety were also pertinent to this visit.  Chronic OA Pain multiple joints Chronic insomnia. Patient    Wakes up 2 to 3 times per night . Bedtime hygiene reviewed,  Patient has been using an electronic book to read before bed.  Does not drink caffeinated beverages after 3 PM.  Only voids  bladder once per night.  No snoring partner. Does not drink alcohol to excess.  Not exercising excessively in the evening. Patient does have a history of anxiety but does not lie awake worrying about issues that cannot be resolved. Does not take stimulants. .  History Simeon has a past medical history of Anemia; Arthritis; Cancer ([place]); Complication of anesthesia; Connective tissue disease ([place]); Contact lens/glasses fitting; Degenerative disc disease (1995); Edema of both legs; Family history of anesthesia complication; Headache(784.0); History of blood transfusion; Hypothyroidism; Lymphedema; Neuropathy ([place]); Osteopenia (02/2012); and PONV (postoperative nausea and vomiting).   She has a past surgical history that includes Back  surgery; Vaginal hysterectomy (1986); Thyroidectomy (1986); abdominal plasty; Appendectomy; Tonsillectomy and adenoidectomy; Eye surgery; Breast enhancement surgery (1978); Tubal ligation; left elbow; left wrist; knee replace; Total  knee arthroplasty; Total shoulder replacement; Spinal cord stimulator implant (2011); hemorrhoid; Knee arthroscopy; Total knee arthroplasty (05/22/2012); Ulnar nerve transposition (Left, 06/13/2013); and Hammer toe surgery.   Her family history includes Heart disease in her father, mother, and sister; Hyperlipidemia in her mother; Hypertension in her brother, father, mother, sister, and sister; Osteoarthritis in her brother, father, and sister; Pancreatic cancer in her mother; Rheum arthritis in her mother; Skin cancer in her father, mother, and sister; Stroke in her sister; Thyroid disease in her sister.She reports that she quit smoking about 28 years ago. Her smoking use included Cigarettes. She has a 20.00 pack-year smoking history. She has never used smokeless tobacco. She reports that she does not drink alcohol or use drugs.  Outpatient Medications Prior to Visit  Medication Sig Dispense Refill  . estradiol (ESTRACE) 1 MG tablet Take 1 tablet (1 mg total) by mouth daily. 90 tablet 1  . Eszopiclone (ESZOPICLONE) 3 MG TABS Take 3 mg by mouth at bedtime. Take immediately before bedtime. Name Brand ONLY    . gabapentin (NEURONTIN) 100 MG capsule Take 300 mg by mouth 3 (three) times daily.    Marland Kitchen ibuprofen (ADVIL,MOTRIN) 600 MG tablet Take 600 mg by mouth as needed.    Marland Kitchen ipratropium (ATROVENT) 0.06 % nasal spray Place 2 sprays into both nostrils 4 (four) times daily. 15 mL 12  . methocarbamol (ROBAXIN) 500 MG tablet Take 1,000 mg by mouth Every 6 hours.     . montelukast (SINGULAIR) 10 MG tablet Take 1 tablet (10 mg total) by mouth at bedtime. 30 tablet 3  . morphine (MSIR) 30 MG tablet Take 60 mg by mouth every 6 (six) hours as needed (takes 3xdaily). pain    . Multiple Vitamins-Minerals (MULTIVITAMIN WITH MINERALS) tablet Take 1 tablet by mouth daily.    . naloxegol oxalate (MOVANTIK) 25 MG TABS tablet Take 25 mg by mouth daily as needed.    . polyethylene glycol (MIRALAX / GLYCOLAX) packet Take 17 g  by mouth as needed. constipation    . potassium chloride (K-DUR,KLOR-CON) 10 MEQ tablet Take 10 mEq by mouth 2 (two) times daily as needed. With torsemide usage    . SYNTHROID 125 MCG tablet TAKE ONE TABLET BY MOUTH ONCE DAILY 30 tablet 11  . tiZANidine (ZANAFLEX) 4 MG tablet Take 1 tablet (4 mg total) by mouth every 6 (six) hours as needed for muscle spasms. 90 tablet 2  . torsemide (DEMADEX) 5 MG tablet Take 1 tablet (5 mg total) by mouth daily as needed. 90 tablet 0  . levofloxacin (LEVAQUIN) 500 MG tablet Take 1 tablet (500 mg total) by mouth daily. (Patient not taking: Reported on 09/01/2016) 10 tablet 0  . meloxicam (MOBIC) 15 MG tablet Take 1 tablet (15 mg total) by mouth daily. (Patient not taking: Reported on 09/01/2016) 90 tablet 1  . predniSONE (DELTASONE) 10 MG tablet 6 tablets on Day 1 , then reduce by 1 tablet daily until gone (Patient not taking: Reported on 09/01/2016) 21 tablet 0  . predniSONE (DELTASONE) 10 MG tablet Take 1 tablet (10 mg total) by mouth daily with breakfast. 6 tablets all at once on Day 1,  Then taper by 1 tablet daily until gone (Patient not taking: Reported on 09/01/2016) 21 tablet 0  . Tdap (BOOSTRIX) 5-2.5-18.5 LF-MCG/0.5 injection Inject 0.5 mLs into the  muscle once. (Patient not taking: Reported on 09/01/2016) 0.5 mL 0  . levofloxacin (LEVAQUIN) 500 MG tablet Take 1 tablet (500 mg total) by mouth daily. (Patient not taking: Reported on 09/01/2016) 10 tablet 0   No facility-administered medications prior to visit.     Review of Systems   Patient denies headache, fevers, malaise, unintentional weight loss, skin rash, eye pain, sinus congestion and sinus pain, sore throat, dysphagia,  hemoptysis , cough, dyspnea, wheezing, chest pain, palpitations, orthopnea, edema, abdominal pain, nausea, melena, diarrhea, constipation, flank pain, dysuria, hematuria, urinary  Frequency, nocturia, numbness, tingling, seizures,  Focal weakness, Loss of consciousness,  Tremor,,  depression, anxiety, and suicidal ideation.      Objective:  BP 108/62   Pulse 60   Temp 97.9 F (36.6 C) (Oral)   Resp 12   Ht 5\' 7"  (1.702 m)   Wt 137 lb 8 oz (62.4 kg)   SpO2 97%   BMI 21.54 kg/m   Physical Exam   General appearance: alert, cooperative and appears stated age Head: Normocephalic, without obvious abnormality, atraumatic Eyes: conjunctivae/corneas clear. PERRL, EOM's intact. Fundi benign. Ears: normal TM's and external ear canals both ears Nose: Nares normal. Septum midline. Mucosa normal. No drainage or sinus tenderness. Throat: lips, mucosa, and tongue normal; teeth and gums normal Neck: no adenopathy, no carotid bruit, no JVD, supple, symmetrical, trachea midline and thyroid not enlarged, symmetric, no tenderness/mass/nodules Lungs: clear to auscultation bilaterally Breasts: normal appearance, no masses or tenderness Heart: regular rate and rhythm, S1, S2 normal, no murmur, click, rub or gallop Abdomen: soft, non-tender; bowel sounds normal; no masses,  no organomegaly Extremities: extremities normal, atraumatic, no cyanosis or edema Pulses: 2+ and symmetric Skin: Skin color, texture, turgor normal. No rashes or lesions Neurologic: Alert and oriented X 3, normal strength and tone. Normal symmetric reflexes. Normal coordination and gait.      Assessment & Plan:   Problem List Items Addressed This Visit    Hypothyroidism    Iatrogenic secondary to cold nodule in the 1980's.  TSH is currently suppressed.Reducing dose to 112 mcg of  synthroid name brand only. Recheck tsh I 6 weeks      Insomnia secondary to anxiety    Chronic, Discussed natural remedies for insomnia ncluding herbal tea and melatonin, which she has already tried. .  Reviewd principles of good sleep hygiene, which she is already following  Trial of valium.       Relevant Medications   diazepam (VALIUM) 5 MG tablet   Polyarthritis    Trial of diclofenac.  If not tolerated,  cymbalta  trial discussed        Other Visit Diagnoses    History of colon polyps    -  Primary   Relevant Orders   Ambulatory referral to General Surgery   Screening for breast cancer       Relevant Orders   MM Digital Screening      I am having Ms. Boen start on Diclofenac Sodium CR and diazepam. I am also having her maintain her morphine, gabapentin, polyethylene glycol, potassium chloride, methocarbamol, Eszopiclone, ibuprofen, multivitamin with minerals, naloxegol oxalate, predniSONE, Tdap, tiZANidine, SYNTHROID, ipratropium, torsemide, meloxicam, levofloxacin, predniSONE, montelukast, estradiol, and Red Yeast Rice.  Meds ordered this encounter  Medications  . Red Yeast Rice 600 MG CAPS    Sig: Take 600 mg by mouth 2 (two) times daily.  . Diclofenac Sodium CR 100 MG 24 hr tablet    Sig: Take 1 tablet (100  mg total) by mouth daily.    Dispense:  30 tablet    Refill:  1  . diazepam (VALIUM) 5 MG tablet    Sig: Take 1 tablet (5 mg total) by mouth at bedtime as needed for anxiety.    Dispense:  30 tablet    Refill:  1    There are no discontinued medications.  Follow-up: No Follow-up on file.   Crecencio Mc, MD

## 2016-09-02 ENCOUNTER — Telehealth: Payer: Self-pay | Admitting: Internal Medicine

## 2016-09-02 NOTE — Telephone Encounter (Signed)
Pt requested a call, she also had questions about valium   Pt contact [phone]

## 2016-09-02 NOTE — Telephone Encounter (Signed)
Patient called to advise PCP synthroid dose is 125 Mcg and that patient must have Brand name only pharmacy has been updated,

## 2016-09-02 NOTE — Telephone Encounter (Signed)
CORRECT  

## 2016-09-02 NOTE — Telephone Encounter (Signed)
Called patient and let her know yes Valium not trazodone for sleep.

## 2016-09-04 ENCOUNTER — Other Ambulatory Visit: Payer: Self-pay | Admitting: Internal Medicine

## 2016-09-04 DIAGNOSIS — G8929 Other chronic pain: Secondary | ICD-10-CM | POA: Insufficient documentation

## 2016-09-04 DIAGNOSIS — F419 Anxiety disorder, unspecified: Secondary | ICD-10-CM

## 2016-09-04 DIAGNOSIS — F5105 Insomnia due to other mental disorder: Secondary | ICD-10-CM

## 2016-09-04 DIAGNOSIS — G4701 Insomnia due to medical condition: Secondary | ICD-10-CM | POA: Insufficient documentation

## 2016-09-04 MED ORDER — LEVOTHYROXINE SODIUM 112 MCG PO TABS
112.0000 ug | ORAL_TABLET | Freq: Every day | ORAL | 0 refills | Status: DC
Start: 2016-09-04 — End: 2016-12-13

## 2016-09-04 NOTE — Assessment & Plan Note (Signed)
Trial of diclofenac.  If not tolerated,  cymbalta trial discussed

## 2016-09-04 NOTE — Assessment & Plan Note (Signed)

## 2016-09-04 NOTE — Assessment & Plan Note (Addendum)
Chronic, Discussed natural remedies for insomnia ncluding herbal tea and melatonin, which she has already tried. .  Reviewd principles of good sleep hygiene, which she is already following  Trial of valium.

## 2016-09-06 ENCOUNTER — Encounter: Payer: Self-pay | Admitting: Internal Medicine

## 2016-09-06 ENCOUNTER — Other Ambulatory Visit: Payer: Self-pay | Admitting: Internal Medicine

## 2016-09-06 ENCOUNTER — Telehealth: Payer: Self-pay | Admitting: Internal Medicine

## 2016-09-06 DIAGNOSIS — Z1231 Encounter for screening mammogram for malignant neoplasm of breast: Secondary | ICD-10-CM

## 2016-09-06 DIAGNOSIS — Z1211 Encounter for screening for malignant neoplasm of colon: Secondary | ICD-10-CM

## 2016-09-06 NOTE — Telephone Encounter (Signed)
Patient requesting referral to [facility]. Last colonoscopy 2008 per patient .

## 2016-09-06 NOTE — Telephone Encounter (Signed)
Left patient detailed message as to referral and medication refill.

## 2016-09-06 NOTE — Telephone Encounter (Signed)
done

## 2016-09-07 ENCOUNTER — Emergency Department
Admission: EM | Admit: 2016-09-07 | Discharge: 2016-09-07 | Disposition: A | Discharge status: Home / Self Care | Payer: Medicare Other | Attending: Emergency Medicine | Admitting: Emergency Medicine

## 2016-09-07 ENCOUNTER — Encounter: Payer: Self-pay | Admitting: Emergency Medicine

## 2016-09-07 ENCOUNTER — Emergency Department: Payer: Medicare Other

## 2016-09-07 DIAGNOSIS — Z23 Encounter for immunization: Secondary | ICD-10-CM | POA: Diagnosis not present

## 2016-09-07 DIAGNOSIS — S0101XA Laceration without foreign body of scalp, initial encounter: Secondary | ICD-10-CM | POA: Insufficient documentation

## 2016-09-07 DIAGNOSIS — E039 Hypothyroidism, unspecified: Secondary | ICD-10-CM | POA: Diagnosis not present

## 2016-09-07 DIAGNOSIS — Y929 Unspecified place or not applicable: Secondary | ICD-10-CM | POA: Diagnosis not present

## 2016-09-07 DIAGNOSIS — S0990XA Unspecified injury of head, initial encounter: Secondary | ICD-10-CM

## 2016-09-07 DIAGNOSIS — W1809XA Striking against other object with subsequent fall, initial encounter: Secondary | ICD-10-CM | POA: Diagnosis not present

## 2016-09-07 DIAGNOSIS — Z791 Long term (current) use of non-steroidal anti-inflammatories (NSAID): Secondary | ICD-10-CM | POA: Insufficient documentation

## 2016-09-07 DIAGNOSIS — Z79899 Other long term (current) drug therapy: Secondary | ICD-10-CM | POA: Diagnosis not present

## 2016-09-07 DIAGNOSIS — Y999 Unspecified external cause status: Secondary | ICD-10-CM | POA: Insufficient documentation

## 2016-09-07 DIAGNOSIS — Z87891 Personal history of nicotine dependence: Secondary | ICD-10-CM | POA: Diagnosis not present

## 2016-09-07 DIAGNOSIS — Y939 Activity, unspecified: Secondary | ICD-10-CM | POA: Insufficient documentation

## 2016-09-07 MED ORDER — TETANUS-DIPHTH-ACELL PERTUSSIS 5-2.5-18.5 LF-MCG/0.5 IM SUSP
0.5000 mL | Freq: Once | INTRAMUSCULAR | Status: AC
  Administered 2016-09-07: 0.5 mL via INTRAMUSCULAR

## 2016-09-07 MED ORDER — TETANUS-DIPHTH-ACELL PERTUSSIS 5-2.5-18.5 LF-MCG/0.5 IM SUSP
INTRAMUSCULAR | Status: AC
  Administered 2016-09-07: 0.5 mL via INTRAMUSCULAR
  Filled 2016-09-07: qty 0.5

## 2016-09-07 NOTE — ED Provider Notes (Signed)
[facility] Emergency Department Provider Note  Time seen: 7:26 PM  I have reviewed the triage vital signs and the nursing notes.   HISTORY  Chief Complaint Fall and Head Injury    HPI Sonya Cordova is a 70 y.o. female presents to the emergency department after a fall. According to the patient she fell backwards hitting the head on an open pantry door. Denies LOC. Patient states bleeding from the back of the head which is why she came to the emergency department. Denies headache. Denies back pain states mild neck stiffness but denies pain.  Past Medical History:  Diagnosis Date  . Anemia   . Arthritis   . Cancer (HCC)    Basal cell  . Complication of anesthesia    "states remembers surgeon talking- was light"  . Connective tissue disease ([place])   . Contact lens/glasses fitting    wears contacts or glasses  . Degenerative disc disease 1995  . Edema of both legs   . Family history of anesthesia complication   . Headache(784.0)    migraines  . History of blood transfusion   . Hypothyroidism   . Lymphedema   . Neuropathy ([place])   . Osteopenia 02/2012   t score -1.1  . PONV (postoperative nausea and vomiting)     Patient Active Problem List   Diagnosis Date Noted  . Insomnia secondary to anxiety 09/04/2016  . Allergic rhinitis 07/06/2016  . 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 Medicare annual wellness visit, subsequent 01/19/2015  . TMJ (temporomandibular joint syndrome) 09/23/2013  . Polyarthritis 09/20/2013  . Hypothyroidism 09/20/2013  . Edema of both legs 11/01/2012  . OA (osteoarthritis) of knee 05/22/2012  . Degeneration of lumbar or lumbosacral intervertebral disc   . Arthritis   . Connective tissue disease ([place])   . Osteopenia     Past Surgical History:  Procedure Laterality Date  . abdominal plasty    . APPENDECTOMY    . BACK SURGERY     neck,, cervical, fusion 1995-2010 ( 17 surgeries)   . BREAST ENHANCEMENT SURGERY  1978  . EYE SURGERY     as a child  . HAMMER TOE SURGERY    . hemorrhoid    . KNEE ARTHROSCOPY    . knee replace    . left elbow    . left wrist    . SPINAL CORD STIMULATOR IMPLANT  2011  . THYROIDECTOMY  1986  . TONSILLECTOMY AND ADENOIDECTOMY    . TOTAL KNEE ARTHROPLASTY     2 times right  . TOTAL KNEE ARTHROPLASTY  05/22/2012   Procedure: TOTAL KNEE ARTHROPLASTY;  Surgeon: Gearlean Alf, MD;  Location: [facility];  Service: Orthopedics;  Laterality: Left;  . TOTAL SHOULDER REPLACEMENT     2 times right shoulder  . TUBAL LIGATION    . ULNAR NERVE TRANSPOSITION Left 06/13/2013   Procedure: DECOMPRESSION, POSSIBLE TRANSPOSITION LEFT ULNAR NERVE;  Surgeon: Wynonia Sours, MD;  Location: [facility];  Service: Orthopedics;  Laterality: Left;  Marland Kitchen VAGINAL HYSTERECTOMY  1986    Prior to Admission medications   Medication Sig Start Date End Date Taking? Authorizing Provider  diazepam (VALIUM) 5 MG tablet Take 1 tablet (5 mg total) by mouth at bedtime as needed for anxiety. 09/01/16   Crecencio Mc, MD  Diclofenac Sodium CR 100 MG 24 hr tablet Take 1 tablet (100 mg total) by mouth daily. 09/01/16   Crecencio Mc,  MD  estradiol (ESTRACE) 1 MG tablet Take 1 tablet (1 mg total) by mouth daily. 07/27/16   Crecencio Mc, MD  Eszopiclone (ESZOPICLONE) 3 MG TABS Take 3 mg by mouth at bedtime. Take immediately before bedtime. Name Dickenson Provider, MD  gabapentin (NEURONTIN) 100 MG capsule Take 300 mg by mouth 3 (three) times daily.    Historical Provider, MD  ibuprofen (ADVIL,MOTRIN) 600 MG tablet Take 600 mg by mouth as needed. 06/25/15   Historical Provider, MD  ipratropium (ATROVENT) 0.06 % nasal spray Place 2 sprays into both nostrils 4 (four) times daily. 03/02/16   Coral Spikes, DO  levofloxacin (LEVAQUIN) 500 MG tablet Take 1 tablet (500 mg total) by mouth daily. Patient not taking: Reported on 09/01/2016 07/05/16   Crecencio Mc, MD   levothyroxine (SYNTHROID) 112 MCG tablet Take 1 tablet (112 mcg total) by mouth daily before breakfast. 09/04/16   Crecencio Mc, MD  meloxicam (MOBIC) 15 MG tablet Take 1 tablet (15 mg total) by mouth daily. Patient not taking: Reported on 09/01/2016 03/24/16   Crecencio Mc, MD  methocarbamol (ROBAXIN) 500 MG tablet Take 1,000 mg by mouth Every 6 hours.  09/01/12   Historical Provider, MD  montelukast (SINGULAIR) 10 MG tablet Take 1 tablet (10 mg total) by mouth at bedtime. 07/05/16   Crecencio Mc, MD  morphine (MSIR) 30 MG tablet Take 60 mg by mouth every 6 (six) hours as needed (takes 3xdaily). pain    Historical Provider, MD  Multiple Vitamins-Minerals (MULTIVITAMIN WITH MINERALS) tablet Take 1 tablet by mouth daily.    Historical Provider, MD  naloxegol oxalate (MOVANTIK) 25 MG TABS tablet Take 25 mg by mouth daily as needed.    Historical Provider, MD  polyethylene glycol (MIRALAX / GLYCOLAX) packet Take 17 g by mouth as needed. constipation    Historical Provider, MD  potassium chloride (K-DUR,KLOR-CON) 10 MEQ tablet Take 10 mEq by mouth 2 (two) times daily as needed. With torsemide usage    Historical Provider, MD  predniSONE (DELTASONE) 10 MG tablet 6 tablets on Day 1 , then reduce by 1 tablet daily until gone Patient not taking: Reported on 09/01/2016 08/22/15   Crecencio Mc, MD  predniSONE (DELTASONE) 10 MG tablet Take 1 tablet (10 mg total) by mouth daily with breakfast. 6 tablets all at once on Day 1,  Then taper by 1 tablet daily until gone Patient not taking: Reported on 09/01/2016 07/05/16   Crecencio Mc, MD  Red Yeast Rice 600 MG CAPS Take 600 mg by mouth 2 (two) times daily.    Historical Provider, MD  SYNTHROID 125 MCG tablet TAKE ONE TABLET BY MOUTH ONCE DAILY 11/04/15   Crecencio Mc, MD  Tdap Durwin Reges) 5-2.5-18.5 LF-MCG/0.5 injection Inject 0.5 mLs into the muscle once. Patient not taking: Reported on 09/01/2016 08/22/15   Crecencio Mc, MD  tiZANidine (ZANAFLEX) 4 MG  tablet Take 1 tablet (4 mg total) by mouth every 6 (six) hours as needed for muscle spasms. 10/13/15   Crecencio Mc, MD  torsemide (DEMADEX) 5 MG tablet Take 1 tablet (5 mg total) by mouth daily as needed. 03/15/16   Crecencio Mc, MD    Allergies  Allergen Reactions  . Cleocin [Clindamycin Hcl] Anaphylaxis  . Amoxicillin-Pot Clavulanate Hives    augumentin  . Codeine Nausea And Vomiting    Family History  Problem Relation Age of Onset  . Hypertension Sister   .  Stroke Sister   . Heart disease Sister   . Thyroid disease Sister   . Hypertension Mother   . Heart disease Mother   . Pancreatic cancer Mother   . Skin cancer Mother   . Rheum arthritis Mother   . Hyperlipidemia Mother   . Hypertension Father   . Heart disease Father   . Skin cancer Father   . Osteoarthritis Father   . Hypertension Brother   . Osteoarthritis Brother   . Skin cancer Sister   . Osteoarthritis Sister   . Hypertension Sister     Social History Social History  Substance Use Topics  . Smoking status: Former Smoker    Packs/day: 1.00    Years: 20.00    Types: Cigarettes    Quit date: 10/05/1987  . Smokeless tobacco: Never Used  . Alcohol use No    Review of Systems Constitutional: Negative for fever.Negative for loss of consciousness. Cardiovascular: Negative for chest pain. Respiratory: Negative for shortness of breath. Gastrointestinal: Negative for abdominal pain Neurological: Negative for headache 10-point ROS otherwise negative.  ____________________________________________   PHYSICAL EXAM:  VITAL SIGNS: ED Triage Vitals [09/07/16 1834]  Enc Vitals Group     BP (!) 129/55     Pulse Rate 71     Resp 18     Temp 97.4 F (36.3 C)     Temp Source Oral     SpO2 100 %     Weight 130 lb (59 kg)     Height 5\' 7"  (1.702 m)     Head Circumference      Peak Flow      Pain Score 6     Pain Loc      Pain Edu?      Excl. in [place]?     Constitutional: Alert and oriented. Well  appearing and in no distress. Eyes: Normal exam ENT   Head: Approximate 2 cm laceration to the back of the head.   Mouth/Throat: Mucous membranes are moist. Cardiovascular: Normal rate, regular rhythm. No murmur Respiratory: Normal respiratory effort without tachypnea nor retractions. Breath sounds are clear  Gastrointestinal: Soft and nontender. No distention Musculoskeletal: Nontender with normal range of motion in all extremities. Neurologic:  Normal speech and language. No gross focal neurologic deficits Skin:  Skin is warm, dry and intact.  Psychiatric: Mood and affect are normal.     RADIOLOGY  CT head negative for acute abnormality  ____________________________________________   INITIAL IMPRESSION / ASSESSMENT AND PLAN / ED COURSE  Pertinent labs & imaging results that were available during my care of the patient were reviewed by me and considered in my medical decision making (see chart for details).  Patient fell backwards hitting her head on an open door. Denies LOC. States bleeding to the back of the head which brought her to the emergency department. Currently the patient appears well, no distress, denies headache but states mild neck stiffness, no tenderness to palpation of the cervical spine. CT scan of the head is negative. Patient will require laceration repair with staples.  Laceration with staples completed with no Occasions. We will update the patient's DTaP in the emergency department. Patient will be discharged with PCP follow-up. Patient has pain medication at home per patient.  ____________________________________________   FINAL CLINICAL IMPRESSION(S) / ED DIAGNOSES  Scalp laceration Head injury    Harvest Dark, MD 09/07/16 1949

## 2016-09-07 NOTE — Discharge Instructions (Signed)
You have been seen in the emergency department following a fall. If suffered a laceration to the back of the head which has been repaired with 5 staples. Please follow-up your primary care doctor in 7-10 days for reevaluation and staple removal. Return to the emergency department for any personally concerning symptoms.

## 2016-09-07 NOTE — ED Triage Notes (Signed)
Patient presents to the ED with bleeding to the back of her head after falling backwards at home.  Patient states, "I've had back surgery and I leaned to far one way and fell over."  Patient states, "I don't feel well."  Patient states she couldn't get up after she fell.  Patient's husband found patient after she fell.  Patient denies losing consciousness.

## 2016-09-07 NOTE — ED Notes (Signed)
Discharge instructions reviewed with patient. Patient verbalized understanding. Patient ambulated to lobby without difficulty.   

## 2016-09-08 ENCOUNTER — Telehealth: Payer: Self-pay | Admitting: Internal Medicine

## 2016-09-08 ENCOUNTER — Other Ambulatory Visit: Payer: Self-pay | Admitting: Internal Medicine

## 2016-09-08 DIAGNOSIS — Z87891 Personal history of nicotine dependence: Secondary | ICD-10-CM

## 2016-09-08 NOTE — Telephone Encounter (Signed)
Pt lvm stating that she was prescribed levothyroxine   But she needs the brand name for Synthroid . She states that she can not take generic. Please cb pt regarding this. Pt cb [phone]

## 2016-09-09 NOTE — Telephone Encounter (Signed)
Notified pharmacy patient is name brand only due to generic does not work for patient. Pharmacy to call patient and advised patient of medication being refilled at name brand only.

## 2016-09-10 ENCOUNTER — Inpatient Hospital Stay: Admission: RE | Admit: 2016-09-10 | Payer: Medicare Other | Source: Ambulatory Visit

## 2016-09-13 NOTE — Telephone Encounter (Signed)
Received referral for initial lung cancer screening scan. Contacted patient and obtained smoking history,(former, quit 1989) Informed patient that with quit date > 15 years she does not meet criteria for lung cancer screening Ct scan. Reinforced the positive aspect of her cessation and that she should still notify her PCP for any s/s of lung cancer.

## 2016-09-14 ENCOUNTER — Ambulatory Visit (INDEPENDENT_AMBULATORY_CARE_PROVIDER_SITE_OTHER): Payer: Medicare Other | Admitting: Internal Medicine

## 2016-09-14 ENCOUNTER — Encounter: Payer: Self-pay | Admitting: Internal Medicine

## 2016-09-14 VITALS — BP 116/60 | HR 71 | Temp 97.5°F | Resp 12 | Ht 67.0 in

## 2016-09-14 DIAGNOSIS — R930 Abnormal findings on diagnostic imaging of skull and head, not elsewhere classified: Secondary | ICD-10-CM

## 2016-09-14 DIAGNOSIS — S0101XA Laceration without foreign body of scalp, initial encounter: Secondary | ICD-10-CM | POA: Diagnosis not present

## 2016-09-14 DIAGNOSIS — R2689 Other abnormalities of gait and mobility: Secondary | ICD-10-CM | POA: Diagnosis not present

## 2016-09-14 NOTE — Progress Notes (Signed)
Pre-visit discussion using our clinic review tool. No additional management support is needed unless otherwise documented below in the visit note.  

## 2016-09-14 NOTE — Progress Notes (Signed)
Subjective:  Patient ID: Sonya Cordova, female    DOB: 09/23/1946  Age: 70 y.o. MRN: [id number]  CC: The primary encounter diagnosis was Loss of balance. Diagnoses of Laceration without foreign body of scalp, initial encounter and Abnormal CT scan, head were also pertinent to this visit.  Sonya Cordova presents for removal of 5 staples that were placed on Dec 5 by ER physician during evaluation for a scalp laceration that occurred at home after falling backward and striking the top of her head on an open pantry door. [place] states that she was standing at the kitchen sink and suddenly lost her balance and fell backward. She hit her had against an open pantry door and fell to the floor.  She reports considerable bleeding from the scalp.  She was taken to the ER for evaluation and treatment . CT of the head was done to rule out acute changes and was reviewed with patient today.    Has been having "terrible" headaches since the fall.  Deneis vision changes,  Neck stiffness and nausea.   She notes a loss of balance since her last spine surgery due to her central /core weakness.   PT referral discussed for balance and core strength.     Outpatient Medications Prior to Visit  Medication Sig Dispense Refill  . diazepam (VALIUM) 5 MG tablet Take 1 tablet (5 mg total) by mouth at bedtime as needed for anxiety. 30 tablet 1  . Diclofenac Sodium CR 100 MG 24 hr tablet Take 1 tablet (100 mg total) by mouth daily. 30 tablet 1  . estradiol (ESTRACE) 1 MG tablet Take 1 tablet (1 mg total) by mouth daily. 90 tablet 1  . Eszopiclone (ESZOPICLONE) 3 MG TABS Take 3 mg by mouth at bedtime. Take immediately before bedtime. Name Brand ONLY    . gabapentin (NEURONTIN) 100 MG capsule Take 300 mg by mouth 3 (three) times daily.    Marland Kitchen ibuprofen (ADVIL,MOTRIN) 600 MG tablet Take 600 mg by mouth as needed.    Marland Kitchen ipratropium (ATROVENT) 0.06 % nasal spray Place 2 sprays into both nostrils 4 (four) times  daily. 15 mL 12  . levothyroxine (SYNTHROID) 112 MCG tablet Take 1 tablet (112 mcg total) by mouth daily before breakfast. 90 tablet 0  . methocarbamol (ROBAXIN) 500 MG tablet Take 1,000 mg by mouth Every 6 hours.     . montelukast (SINGULAIR) 10 MG tablet Take 1 tablet (10 mg total) by mouth at bedtime. 30 tablet 3  . morphine (MSIR) 30 MG tablet Take 60 mg by mouth every 6 (six) hours as needed (takes 3xdaily). pain    . Multiple Vitamins-Minerals (MULTIVITAMIN WITH MINERALS) tablet Take 1 tablet by mouth daily.    . naloxegol oxalate (MOVANTIK) 25 MG TABS tablet Take 25 mg by mouth daily as needed.    . polyethylene glycol (MIRALAX / GLYCOLAX) packet Take 17 g by mouth as needed. constipation    . potassium chloride (K-DUR,KLOR-CON) 10 MEQ tablet Take 10 mEq by mouth 2 (two) times daily as needed. With torsemide usage    . Red Yeast Rice 600 MG CAPS Take 600 mg by mouth 2 (two) times daily.    Marland Kitchen SYNTHROID 125 MCG tablet TAKE ONE TABLET BY MOUTH ONCE DAILY 30 tablet 11  . tiZANidine (ZANAFLEX) 4 MG tablet Take 1 tablet (4 mg total) by mouth every 6 (six) hours as needed for muscle spasms. 90 tablet 2  . torsemide (DEMADEX) 5 MG  tablet Take 1 tablet (5 mg total) by mouth daily as needed. 90 tablet 0  . levofloxacin (LEVAQUIN) 500 MG tablet Take 1 tablet (500 mg total) by mouth daily. (Patient not taking: Reported on 09/14/2016) 10 tablet 0  . meloxicam (MOBIC) 15 MG tablet Take 1 tablet (15 mg total) by mouth daily. (Patient not taking: Reported on 09/14/2016) 90 tablet 1  . predniSONE (DELTASONE) 10 MG tablet 6 tablets on Day 1 , then reduce by 1 tablet daily until gone (Patient not taking: Reported on 09/14/2016) 21 tablet 0  . predniSONE (DELTASONE) 10 MG tablet Take 1 tablet (10 mg total) by mouth daily with breakfast. 6 tablets all at once on Day 1,  Then taper by 1 tablet daily until gone (Patient not taking: Reported on 09/14/2016) 21 tablet 0  . Tdap (BOOSTRIX) 5-2.5-18.5 LF-MCG/0.5  injection Inject 0.5 mLs into the muscle once. (Patient not taking: Reported on 09/14/2016) 0.5 mL 0   No facility-administered medications prior to visit.     Review of Systems;  Patient denies, fevers, malaise, unintentional weight loss, skin rash, eye pain, sinus congestion and sinus pain, sore throat, dysphagia,  hemoptysis , cough, dyspnea, wheezing, chest pain, palpitations, orthopnea, edema, abdominal pain, nausea, melena, diarrhea, constipation, flank pain, dysuria, hematuria, urinary  Frequency, nocturia, numbness, tingling, seizures,  Focal weakness, Loss of consciousness,  Tremor, insomnia, depression, anxiety, and suicidal ideation.      Objective:  BP 116/60   Pulse 71   Temp 97.5 F (36.4 C) (Oral)   Resp 12   Ht 5\' 7"  (1.702 m)   SpO2 98%   BP Readings from Last 3 Encounters:  09/14/16 116/60  09/07/16 (!) 111/58  09/01/16 108/62    Wt Readings from Last 3 Encounters:  09/07/16 130 lb (59 kg)  09/01/16 137 lb 8 oz (62.4 kg)  07/05/16 136 lb (61.7 kg)    General appearance: alert, cooperative and appears stated age Ears: normal TM's and external ear canals both ears  Neck: no adenopathy, no carotid bruit, supple, symmetrical, trachea midline and thyroid not enlarged, symmetric, no tenderness/mass/nodules Back: symmetric, no curvature. ROM normal. No CVA tenderness. Neuro: CNs 2-12 intact. DTRs 2+/4 in biceps, brachioradialis, patellars and achilles. Muscle strength 5/5 in upper and lower exremities. Fine resting tremor bilaterally both hands cerebellar function normal. Romberg negative.  No pronator drift.   Gait normal.    No results found for: HGBA1C  Lab Results  Component Value Date   CREATININE 0.75 08/31/2016   CREATININE 0.83 10/14/2015   CREATININE 0.86 07/28/2015    Lab Results  Component Value Date   WBC 5.1 08/31/2016   HGB 12.2 08/31/2016   HCT 36.8 08/31/2016   PLT 156.0 08/31/2016   GLUCOSE 78 08/31/2016   CHOL 189 08/31/2016   TRIG  65.0 08/31/2016   HDL 73.70 08/31/2016   LDLDIRECT 134.7 04/19/2013   LDLCALC 102 (H) 08/31/2016   ALT 12 08/31/2016   AST 19 08/31/2016   NA 140 08/31/2016   K 3.6 08/31/2016   CL 101 08/31/2016   CREATININE 0.75 08/31/2016   BUN 22 08/31/2016   CO2 34 (H) 08/31/2016   TSH 0.22 (L) 08/31/2016   INR 0.85 05/16/2012   MICROALBUR 0.3 11/01/2012    Ct Head Wo Contrast  Result Date: 09/07/2016 CLINICAL DATA:  Patient fell backwards and struck head at home. EXAM: CT HEAD WITHOUT CONTRAST TECHNIQUE: Contiguous axial images were obtained from the base of the skull through the vertex without  intravenous contrast. COMPARISON:  None. FINDINGS: Brain: There is no evidence for acute hemorrhage, hydrocephalus, mass lesion, or abnormal extra-axial fluid collection. No definite CT evidence for acute infarction. Patchy low attenuation in the deep hemispheric and periventricular white matter is nonspecific, but likely reflects chronic microvascular ischemic demyelination. Vascular: Atherosclerotic calcification is visualized in the carotid arteries. No dense MCA sign. Major dural sinuses are unremarkable. Skull: No evidence for fracture. No worrisome lytic or sclerotic lesion. Sinuses/Orbits: The visualized paranasal sinuses and mastoid air cells are clear. Visualized portions of the globes and intraorbital fat are unremarkable. Other: Soft tissue laceration identified left parieto-occipital scalp IMPRESSION: 1. No acute intracranial abnormality. 2. Mild chronic small vessel white matter ischemic disease. Electronically Signed   By: Misty Stanley M.D.   On: 09/07/2016 19:16    Assessment & Plan:   Problem List Items Addressed This Visit    Abnormal CT scan, head    Chronic microvascular ischemic demyelinating changes were noted along with atherosclerotic calcification in the carotid arteries.  She has no history of hypertension or hyperlipidemia.       Laceration without foreign body of scalp, initial  encounter    scalp examined and no signs of complications including infection and hematoma.  5 staples were removed without complication       Loss of balance - Primary    Secondary to loss of core strength and recent spine surgery  PT referral made.       Relevant Orders   Ambulatory referral to Physical Therapy      I am having Sonya Cordova maintain her morphine, gabapentin, polyethylene glycol, potassium chloride, methocarbamol, Eszopiclone, ibuprofen, multivitamin with minerals, naloxegol oxalate, predniSONE, Tdap, tiZANidine, SYNTHROID, ipratropium, torsemide, meloxicam, levofloxacin, predniSONE, montelukast, estradiol, Red Yeast Rice, Diclofenac Sodium CR, diazepam, and levothyroxine.  No orders of the defined types were placed in this encounter.   There are no discontinued medications.  Follow-up: No Follow-up on file.   Crecencio Mc, MD

## 2016-09-15 DIAGNOSIS — M7542 Impingement syndrome of left shoulder: Secondary | ICD-10-CM | POA: Diagnosis not present

## 2016-09-15 DIAGNOSIS — Z96611 Presence of right artificial shoulder joint: Secondary | ICD-10-CM | POA: Diagnosis not present

## 2016-09-16 DIAGNOSIS — I6523 Occlusion and stenosis of bilateral carotid arteries: Secondary | ICD-10-CM | POA: Insufficient documentation

## 2016-09-16 DIAGNOSIS — R2689 Other abnormalities of gait and mobility: Secondary | ICD-10-CM | POA: Insufficient documentation

## 2016-09-16 DIAGNOSIS — S0101XA Laceration without foreign body of scalp, initial encounter: Secondary | ICD-10-CM | POA: Insufficient documentation

## 2016-09-16 NOTE — Assessment & Plan Note (Signed)
scalp examined and no signs of complications including infection and hematoma.  5 staples were removed without complication

## 2016-09-16 NOTE — Assessment & Plan Note (Signed)
Chronic microvascular ischemic demyelinating changes were noted along with atherosclerotic calcification in the carotid arteries.  She has no history of hypertension or hyperlipidemia.

## 2016-09-16 NOTE — Assessment & Plan Note (Signed)
Secondary to loss of core strength and recent spine surgery  PT referral made.

## 2016-09-20 ENCOUNTER — Ambulatory Visit
Admission: RE | Admit: 2016-09-20 | Discharge: 2016-09-20 | Disposition: A | Discharge status: Home / Self Care | Payer: Medicare Other | Source: Ambulatory Visit | Attending: Internal Medicine | Admitting: Internal Medicine

## 2016-09-20 DIAGNOSIS — Z13820 Encounter for screening for osteoporosis: Secondary | ICD-10-CM | POA: Diagnosis not present

## 2016-09-20 DIAGNOSIS — Z780 Asymptomatic menopausal state: Secondary | ICD-10-CM | POA: Diagnosis not present

## 2016-09-20 DIAGNOSIS — E2839 Other primary ovarian failure: Secondary | ICD-10-CM

## 2016-09-29 DIAGNOSIS — Z79891 Long term (current) use of opiate analgesic: Secondary | ICD-10-CM | POA: Diagnosis not present

## 2016-09-29 DIAGNOSIS — M961 Postlaminectomy syndrome, not elsewhere classified: Secondary | ICD-10-CM | POA: Diagnosis not present

## 2016-09-29 DIAGNOSIS — G4700 Insomnia, unspecified: Secondary | ICD-10-CM | POA: Diagnosis not present

## 2016-09-29 DIAGNOSIS — G894 Chronic pain syndrome: Secondary | ICD-10-CM | POA: Diagnosis not present

## 2016-10-08 ENCOUNTER — Encounter: Payer: Self-pay | Admitting: Internal Medicine

## 2016-10-11 ENCOUNTER — Ambulatory Visit: Payer: Medicare Other | Attending: Internal Medicine | Admitting: Physical Therapy

## 2016-10-11 ENCOUNTER — Ambulatory Visit: Payer: Medicare Other

## 2016-10-11 DIAGNOSIS — R42 Dizziness and giddiness: Secondary | ICD-10-CM | POA: Diagnosis not present

## 2016-10-11 DIAGNOSIS — R262 Difficulty in walking, not elsewhere classified: Secondary | ICD-10-CM | POA: Insufficient documentation

## 2016-10-11 DIAGNOSIS — Z9181 History of falling: Secondary | ICD-10-CM

## 2016-10-11 DIAGNOSIS — M6281 Muscle weakness (generalized): Secondary | ICD-10-CM | POA: Insufficient documentation

## 2016-10-11 NOTE — Therapy (Signed)
[facility] [address] [place], [address] Phone: [phone]   Fax:  [phone]  Physical Therapy Evaluation  Patient Details  Name: Sonya Cordova MRN: [id number] Date of Birth: 01/11/1946 Referring Provider: Derrel Nip  Encounter Date: 10/11/2016      PT End of Session - 10/11/16 1027    Visit Number 1   Number of Visits 13   Date for PT Re-Evaluation 2016-12-18   Authorization Type g codes   Authorization - Visit Number 1   PT Start Time 0818   PT Stop Time 0926   PT Time Calculation (min) 68 min   Equipment Utilized During Treatment Gait belt   Activity Tolerance Patient tolerated treatment well   Behavior During Therapy [facility] for tasks assessed/performed      Past Medical History:  Diagnosis Date  . Anemia   . Arthritis   . Cancer (HCC)    Basal cell  . Complication of anesthesia    "states remembers surgeon talking- was light"  . Connective tissue disease ([place])   . Contact lens/glasses fitting    wears contacts or glasses  . Degenerative disc disease 1995  . Edema of both legs   . Family history of anesthesia complication   . Headache(784.0)    migraines  . History of blood transfusion   . Hypothyroidism   . Lymphedema   . Neuropathy ([place])   . Osteopenia 02/2012   t score -1.1  . PONV (postoperative nausea and vomiting)     Past Surgical History:  Procedure Laterality Date  . abdominal plasty    . APPENDECTOMY    . BACK SURGERY     neck,, cervical, fusion 1995-2010 ( 17 surgeries)  . BREAST ENHANCEMENT SURGERY  1978  . EYE SURGERY     as a child  . HAMMER TOE SURGERY    . hemorrhoid    . KNEE ARTHROSCOPY    . knee replace    . left elbow    . left wrist    . SPINAL CORD STIMULATOR IMPLANT  2011  . THYROIDECTOMY  1986  . TONSILLECTOMY AND ADENOIDECTOMY    . TOTAL KNEE ARTHROPLASTY     2 times right  . TOTAL KNEE ARTHROPLASTY  05/22/2012   Procedure: TOTAL KNEE  ARTHROPLASTY;  Surgeon: Gearlean Alf, MD;  Location: [facility];  Service: Orthopedics;  Laterality: Left;  . TOTAL SHOULDER REPLACEMENT     2 times right shoulder  . TUBAL LIGATION    . ULNAR NERVE TRANSPOSITION Left 06/13/2013   Procedure: DECOMPRESSION, POSSIBLE TRANSPOSITION LEFT ULNAR NERVE;  Surgeon: Wynonia Sours, MD;  Location: [facility];  Service: Orthopedics;  Laterality: Left;  Marland Kitchen VAGINAL HYSTERECTOMY  1986    There were no vitals filed for this visit.       Subjective Assessment - 10/11/16 0824    Subjective Pt reports 20 yrs history of DD scoliosis and 22 back surgeries. Placed rod in 2008 for scoliosis, then developed kyphosis. Pt reports having had a difficult history of surgery. Pt medication was switchd to Oxycotin , pt was assumed to be drug seeking and was given no medications, and relied on Tylenol for releif. Pt believes she would have passed if not for her son to intervene with her medical care. Pt has had a rough previous year, pt felt like there was "a bowling ball in her head" and her balance was affected. She feels like a "  little weeble" "feet get ahead of my body" and pt does not feel like she has good balance, but felt as though she was doing well the past few months. Pt reports falling straight back and hitting the frame of the pantry, pt does not remember any part of fall. Pts husband picked her up off the floor and blood had coagulated. Pt went to ED and had 5 staples placed. Pt reports this is her first fall. Pt reports 3 years ago falling out of car due to missng the door hatch.  Pt  reports original rod surgery "straightened her" but did not change her back posture. Pt can't sit in a pew and "doesn't know where to place her head".    Pertinent History Recent fall 5 wks ago, pt reports she is not "overly fearful" of falling.   Limitations Sitting;Walking;Standing   How long can you sit comfortably? difficulty sitting in a pew   Diagnostic tests CT  following fall - dx with no concussion   Patient Stated Goals improved balance, to be able to sit.            [facility] PT Assessment - 10/11/16 0001      Assessment   Medical Diagnosis Loss of balance   Referring Provider [place]o   Onset Date/Surgical Date 11/05/15   Prior Therapy none for this     Precautions   Precautions Fall   Precaution Comments fall 5 wks ago     Restrictions   Weight Bearing Restrictions No     Balance Screen   Has the patient fallen in the past 6 months Yes   How many times? 1   Has the patient had a decrease in activity level because of a fear of falling?  Yes     [place] Private residence   Living Arrangements Spouse/significant other   Type of [place] to enter   Entrance Stairs-Number of Steps [place] One level     Prior Function   Level of Independence Independent   Vocation Retired   Leisure Takes care of horses, requiring considerable walking on variable surfaces.     Observation/Other Assessments   Observations standing posture rotated to R, FHP, moderate muscle guarding of trunk.     ROM / Strength   AROM / PROM / Strength Strength     Strength   Overall Strength Comments hip abduction R 4/5 with pain L 4/5, hip flexion L 2/5 R 4/5, knee extension L 5/5 R 5/5, knee flexion L 5/5 R 5/5, ankle DF L 5/5 R 5/5,  ankle PF R and L 4/5, hand grip strength L 47lbs R 59lbs     Ambulation/Gait   Gait Comments Pt relies considerably more on R leg, increased stance time on R, pt lacks coordination between UE and LE. R trunk rotation throught gait cycle. Noted drag of L foot. Min assist, several instances of sway, use of gait belt for pt safety and needed spotting      Standardized Balance Assessment   10 Meter Walk 13.5 seconds     Berg Balance Test   Sit to Stand Able to stand using hands after several tries   Standing Unsupported Able to stand 2  minutes with supervision   Sitting with Back Unsupported but Feet Supported on Floor or Stool Able to sit safely and securely 2 minutes   Stand to Sit  Sits independently, has uncontrolled descent   Transfers Able to transfer safely, definite need of hands   Standing Unsupported with Eyes Closed Unable to keep eyes closed 3 seconds but stays steady   Standing Ubsupported with Feet Together Needs help to attain position but able to stand for 30 seconds with feet together   From Standing, Reach Forward with Outstretched Arm Can reach forward >5 cm safely (2")   From Standing Position, Pick up Object from Floor Unable to pick up shoe, but reaches 2-5 cm (1-2") from shoe and balances independently   From Standing Position, Turn to Look Behind Over each Shoulder Turn sideways only but maintains balance   Turn 360 Degrees Needs close supervision or verbal cueing   Standing Unsupported, Alternately Place Feet on Step/Stool Able to complete 4 steps without aid or supervision   Standing Unsupported, One Foot in [employer] balance while stepping or standing   Standing on One Leg Tries to lift leg/unable to hold 3 seconds but remains standing independently   Total Score 25     Timed Up and Go Test   TUG Comments 17 seconds                           PT Education - 10/11/16 1026    Education provided Yes   Education Details Course of PT   Person(s) Educated Patient   Methods Explanation   Comprehension Verbalized understanding             PT Long Term Goals - 10/11/16 1041      PT LONG TERM GOAL #1   Title Pts Berg balance score will increase to a 50 in 6 weeks so she can safely ambulate in the community.   Baseline 25   Time 6   Period Weeks   Status New     PT LONG TERM GOAL #2   Title Pts LEFS score will increase to 50 in 6 weeks so she can perform ADLs safely and without fear of falling.   Baseline 25   Time 6   Period Weeks   Status New     PT LONG TERM  GOAL #3   Title Pt will be able to safely return to community exercise classes in 6 weeks to work on overall health and conditioning.   Baseline Pt is not participating in exercises due to fear of falling/reinjury.   Time 6   Period Weeks   Status New               Plan - 10/11/16 1028    Clinical Impression Statement Pt is a pleasant 71 y/o female with recent fall, history of poor balance and multiple orthopedic and spine surgeries. Pt displayed gross LE weakness most notably L hip flexion which was a 2/5, pt was unstable with ambulation and required a min assist during gait assessment. Pt is currently not using a cane and is very apprehensive to the idea. Pt could greatly benefit from skilled therapy to strengthen LE to prevent falls and complete ADLs safely.   Rehab Potential Fair   Clinical Impairments Affecting Rehab Potential medical comorbidities/age, active (with horses), community support.   PT Frequency 2x / week   PT Duration 6 weeks   PT Treatment/Interventions ADLs/Self Care Home Management;Aquatic Therapy;Gait training;Stair training;Therapeutic exercise;Therapeutic activities;Balance training;Patient/family education;Manual techniques   PT Next Visit Plan develop HEP   Recommended Other Services potentially look at pelvic floor PT in  the future.   Consulted and Agree with Plan of Care Patient      Patient will benefit from skilled therapeutic intervention in order to improve the following deficits and impairments:  Dizziness, Postural dysfunction, Improper body mechanics, Abnormal gait, Decreased balance, Decreased activity tolerance, Decreased endurance, Decreased range of motion, Decreased strength, Hypomobility, Impaired flexibility, Difficulty walking  Visit Diagnosis: History of falling - Plan: PT plan of care cert/re-cert  Muscle weakness (generalized) - Plan: PT plan of care cert/re-cert  Difficulty in walking, not elsewhere classified - Plan: PT plan of care  cert/re-cert  Dizziness and giddiness - Plan: PT plan of care cert/re-cert      G-Codes - [phone]    Functional Assessment Tool Used LEFS, BERG, TUG, clinical judgment   Functional Limitation Mobility: Walking and moving around   Mobility: Walking and Moving Around Current Status [id number]) At least 60 percent but less than 80 percent impaired, limited or restricted   Mobility: Walking and Moving Around Goal Status [phone]) At least 20 percent but less than 40 percent impaired, limited or restricted       Problem List Patient Active Problem List   Diagnosis Date Noted  . Laceration without foreign body of scalp, initial encounter 09/16/2016  . Loss of balance 09/16/2016  . Abnormal CT scan, head 09/16/2016  . Insomnia secondary to anxiety 09/04/2016  . Allergic rhinitis 07/06/2016  . 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 Medicare annual wellness visit, subsequent 01/19/2015  . TMJ (temporomandibular joint syndrome) 09/23/2013  . Polyarthritis 09/20/2013  . Hypothyroidism 09/20/2013  . Edema of both legs 11/01/2012  . OA (osteoarthritis) of knee 05/22/2012  . Degeneration of lumbar or lumbosacral intervertebral disc   . Arthritis   . Connective tissue disease ([place])   . Osteopenia     Mohab Ashby PT DPT 10/11/2016, 5:52 PM  [facility] [address] [place], [address] Phone: [phone]   Fax:  [phone]  Name: Sonya Cordova MRN: [id number] Date of Birth: [birth year]

## 2016-10-13 ENCOUNTER — Ambulatory Visit
Admission: RE | Admit: 2016-10-13 | Discharge: 2016-10-13 | Disposition: A | Discharge status: Home / Self Care | Payer: Medicare Other | Source: Ambulatory Visit | Attending: Internal Medicine | Admitting: Internal Medicine

## 2016-10-13 ENCOUNTER — Telehealth: Payer: Self-pay

## 2016-10-13 DIAGNOSIS — Z1231 Encounter for screening mammogram for malignant neoplasm of breast: Secondary | ICD-10-CM | POA: Diagnosis not present

## 2016-10-13 MED ORDER — LEVOFLOXACIN 500 MG PO TABS: 500.0000 mg | ORAL_TABLET | Freq: Every day | ORAL | 0 refills | Status: DC

## 2016-10-13 MED ORDER — PREDNISONE 10 MG PO TABS: ORAL_TABLET | ORAL | 0 refills | Status: DC

## 2016-10-13 NOTE — Telephone Encounter (Signed)
Patient notified of medications called in stated she happy and very satisfied now that correct Symptoms were relayed to PCP.

## 2016-10-13 NOTE — Telephone Encounter (Signed)
Patient staeed her frontal sinuses hurt pressure in behind her eyes. Has thick Green drainage, is using all meds recommended by PCP at last visit.Patient stated with pain medications benadryl will sedate her to much. Patient had sent C?O not able to get appointment when you need one but after talking with patient she calmed down when I stated I would send message to PCP.

## 2016-10-13 NOTE — Telephone Encounter (Signed)
Patient is complaining of sinus problems and would like to get a rx sent to pharmacy. Starting yesterday 10/12/16, Last visit was 09/14/16 please advise

## 2016-10-13 NOTE — Telephone Encounter (Signed)
Called patient to ask in detail the signs and symptoms present regarding sinus problems previously noted.  No answer.  Left a message to return a call to the office and ask for myself or a clinical team member to assist.

## 2016-10-13 NOTE — Telephone Encounter (Signed)
Patient states she does not want to do OTC she would like to come in for an OV but is unsure if she can get in soon. Please advise

## 2016-10-13 NOTE — Telephone Encounter (Signed)
Tell her to take generic OTC benadryl 25 mg evwry 8 hours for the drainage,  Sudafed PE  10 to 20 mg every 6 hours for the congestion,  and flushes her sinuses twice daily with Simply Saline.   I do not call in antibiotics for sinus congestion

## 2016-10-13 NOTE — Telephone Encounter (Signed)
That's all I needed to know,  I will send in rx for levaquin and prednisone to her local pharmacy,  Does she need a cough medication?

## 2016-10-14 ENCOUNTER — Other Ambulatory Visit: Payer: Self-pay | Admitting: Internal Medicine

## 2016-10-14 ENCOUNTER — Encounter: Payer: Self-pay | Admitting: Physical Therapy

## 2016-10-14 ENCOUNTER — Ambulatory Visit: Payer: Medicare Other | Admitting: Physical Therapy

## 2016-10-14 DIAGNOSIS — M6281 Muscle weakness (generalized): Secondary | ICD-10-CM | POA: Diagnosis not present

## 2016-10-14 DIAGNOSIS — Z9181 History of falling: Secondary | ICD-10-CM

## 2016-10-14 DIAGNOSIS — R928 Other abnormal and inconclusive findings on diagnostic imaging of breast: Secondary | ICD-10-CM

## 2016-10-14 DIAGNOSIS — R42 Dizziness and giddiness: Secondary | ICD-10-CM | POA: Diagnosis not present

## 2016-10-14 DIAGNOSIS — R262 Difficulty in walking, not elsewhere classified: Secondary | ICD-10-CM

## 2016-10-14 NOTE — Therapy (Signed)
[facility] [address] [place], [address] Phone: [phone]   Fax:  [phone]  Physical Therapy Treatment  Patient Details  Name: Sonya Cordova MRN: [id number] Date of Birth: 1946-09-19 Referring Provider: Derrel Nip  Encounter Date: 10/14/2016      PT End of Session - 10/14/16 1442    Visit Number 2   Number of Visits 13   Date for PT Re-Evaluation 12/06/16   Authorization Type g codes   Authorization - Visit Number 1   PT Start Time [id number]   PT Stop Time 1433   PT Time Calculation (min) 48 min   Equipment Utilized During Treatment Gait belt   Activity Tolerance Patient tolerated treatment well;No increased pain   Behavior During Therapy WFL for tasks assessed/performed      Past Medical History:  Diagnosis Date  . Anemia   . Arthritis   . Cancer (HCC)    Basal cell  . Complication of anesthesia    "states remembers surgeon talking- was light"  . Connective tissue disease ([place])   . Contact lens/glasses fitting    wears contacts or glasses  . Degenerative disc disease 1995  . Edema of both legs   . Family history of anesthesia complication   . Headache(784.0)    migraines  . History of blood transfusion   . Hypothyroidism   . Lymphedema   . Neuropathy ([place])   . Osteopenia 02/2012   t score -1.1  . PONV (postoperative nausea and vomiting)     Past Surgical History:  Procedure Laterality Date  . abdominal plasty    . APPENDECTOMY    . BACK SURGERY     neck,, cervical, fusion 1995-2010 ( 17 surgeries)  . BREAST ENHANCEMENT SURGERY  1978  . EYE SURGERY     as a child  . HAMMER TOE SURGERY    . hemorrhoid    . KNEE ARTHROSCOPY    . knee replace    . left elbow    . left wrist    . SPINAL CORD STIMULATOR IMPLANT  2011  . THYROIDECTOMY  1986  . TONSILLECTOMY AND ADENOIDECTOMY    . TOTAL KNEE ARTHROPLASTY     2 times right  . TOTAL KNEE ARTHROPLASTY  05/22/2012   Procedure:  TOTAL KNEE ARTHROPLASTY;  Surgeon: Gearlean Alf, MD;  Location: [facility];  Service: Orthopedics;  Laterality: Left;  . TOTAL SHOULDER REPLACEMENT     2 times right shoulder  . TUBAL LIGATION    . ULNAR NERVE TRANSPOSITION Left 06/13/2013   Procedure: DECOMPRESSION, POSSIBLE TRANSPOSITION LEFT ULNAR NERVE;  Surgeon: Wynonia Sours, MD;  Location: [facility];  Service: Orthopedics;  Laterality: Left;  Marland Kitchen VAGINAL HYSTERECTOMY  1986    There were no vitals filed for this visit.      Subjective Assessment - 10/14/16 1427    Subjective Pt states that she is especially sore today in "all" of her joints from what she attributes to a change in weather. She states that the constant pain has her "kind of down" lately. She states that her hips have been extremly painful lately due to what her doctor diagnosed as bursitis. Pt states that she felt especially unstable today.   Pertinent History Recent fall 5 wks ago, pt reports she is not "overly fearful" of falling.   Limitations Sitting;Walking;Standing   How long can you sit comfortably? difficulty sitting in a pew   Diagnostic  tests CT following fall - dx with no concussion   Patient Stated Goals improved balance, to be able to sit.   Currently in Pain? Yes   Pain Score 6    Pain Location Hip   Pain Orientation Right;Left   Pain Descriptors / Indicators Aching   Pain Type Chronic pain   Pain Onset More than a month ago   Pain Frequency Occasional      Objective: Pt ambulated 200' with stand by assist and a gait belt requiring min assist to regain balance twice.   Marching exercise to strengthen hip with stable base for support and verbal cuing to flex hip to thigh parallel to floor and eccentrically control leg to floor. x30 each  Box weight shifts to work on strength and balance with verbal and tactile cuing to use quads to return to starting position by pushing off through heel of step foot. x10 each leg.  Stair taps to  increase strength of B LE and balance standing in front of stairs with hand held assist pt alternated tapping each foot to the 2nd step x15 each  Tandem stance 30" x 2 each leg in front, pt required stand by assist and was instructed to do at home with stable base of support and was instructed to not do without supervision.  Nu step pt performed on level 4 for 6 minutes with instruction to keep pace above 60 steps per minute. Pt stated that she felt a good workout in her quads during this exercise.   Pt tolerated treatment well and was eager to perform exercises at home and advance them when appropriate. She expressed "good" fatigue after exercise.                                 PT Long Term Goals - 10/11/16 1041      PT LONG TERM GOAL #1   Title Pts Berg balance score will increase to a 50 in 6 weeks so she can safely ambulate in the community.   Baseline 25   Time 6   Period Weeks   Status New     PT LONG TERM GOAL #2   Title Pts LEFS score will increase to 50 in 6 weeks so she can perform ADLs safely and without fear of falling.   Baseline 25   Time 6   Period Weeks   Status New     PT LONG TERM GOAL #3   Title Pt will be able to safely return to community exercise classes in 6 weeks to work on overall health and conditioning.   Baseline Pt is not participating in exercises due to fear of falling/reinjury.   Time 6   Period Weeks   Status New               Plan - 10/14/16 1446    Clinical Impression Statement Pt displays gross LE weakness most notably hip flexion as well as instability with walking and restricted hip extension. Pt required stand by assist throughout session and occassional min assist during exercises to maintain or regain balance. At next visit implement functional balance exercises to work on stability during ADLs   Rehab Potential Fair   Clinical Impairments Affecting Rehab Potential medical comorbidities/age, active (with  horses), community support.   PT Frequency 2x / week   PT Duration 6 weeks   PT Treatment/Interventions ADLs/Self Care Home Management;Aquatic Therapy;Gait training;Stair training;Therapeutic  exercise;Therapeutic activities;Balance training;Patient/family education;Manual techniques   PT Next Visit Plan advance HEP where appropritate   Consulted and Agree with Plan of Care Patient      Patient will benefit from skilled therapeutic intervention in order to improve the following deficits and impairments:  Dizziness, Postural dysfunction, Improper body mechanics, Abnormal gait, Decreased balance, Decreased activity tolerance, Decreased endurance, Decreased range of motion, Decreased strength, Hypomobility, Impaired flexibility, Difficulty walking  Visit Diagnosis: History of falling  Muscle weakness (generalized)  Difficulty in walking, not elsewhere classified     Problem List Patient Active Problem List   Diagnosis Date Noted  . Laceration without foreign body of scalp, initial encounter 09/16/2016  . Loss of balance 09/16/2016  . Abnormal CT scan, head 09/16/2016  . Insomnia secondary to anxiety 09/04/2016  . Allergic rhinitis 07/06/2016  . 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 Medicare annual wellness visit, subsequent 01/19/2015  . TMJ (temporomandibular joint syndrome) 09/23/2013  . Polyarthritis 09/20/2013  . Hypothyroidism 09/20/2013  . Edema of both legs 11/01/2012  . OA (osteoarthritis) of knee 05/22/2012  . Degeneration of lumbar or lumbosacral intervertebral disc   . Arthritis   . Connective tissue disease ([place])   . Osteopenia     Fisher,Benjamin PT DPT 10/14/2016, 3:07 PM  [facility] [address], [place], [address] Phone: [phone]   Fax:  [phone]  Name: JAQUESE KJAR MRN: [id number] Date of Birth: 1946/05/28

## 2016-10-15 DIAGNOSIS — M7542 Impingement syndrome of left shoulder: Secondary | ICD-10-CM | POA: Diagnosis not present

## 2016-10-15 DIAGNOSIS — Z96611 Presence of right artificial shoulder joint: Secondary | ICD-10-CM | POA: Diagnosis not present

## 2016-10-18 ENCOUNTER — Telehealth: Payer: Self-pay | Admitting: Radiology

## 2016-10-18 ENCOUNTER — Ambulatory Visit
Admission: RE | Admit: 2016-10-18 | Discharge: 2016-10-18 | Disposition: A | Discharge status: Home / Self Care | Payer: Medicare Other | Source: Ambulatory Visit | Attending: Internal Medicine | Admitting: Internal Medicine

## 2016-10-18 ENCOUNTER — Encounter: Payer: Medicare Other | Admitting: Physical Therapy

## 2016-10-18 DIAGNOSIS — E890 Postprocedural hypothyroidism: Secondary | ICD-10-CM

## 2016-10-18 DIAGNOSIS — R928 Other abnormal and inconclusive findings on diagnostic imaging of breast: Secondary | ICD-10-CM

## 2016-10-18 DIAGNOSIS — R921 Mammographic calcification found on diagnostic imaging of breast: Secondary | ICD-10-CM | POA: Diagnosis not present

## 2016-10-18 NOTE — Telephone Encounter (Signed)
t4 and tsh ordered

## 2016-10-18 NOTE — Telephone Encounter (Signed)
Pt coming for labs tomorrow, please place lab orders. Thank you.

## 2016-10-19 ENCOUNTER — Other Ambulatory Visit (INDEPENDENT_AMBULATORY_CARE_PROVIDER_SITE_OTHER): Payer: Medicare Other

## 2016-10-19 DIAGNOSIS — E890 Postprocedural hypothyroidism: Secondary | ICD-10-CM

## 2016-10-20 ENCOUNTER — Ambulatory Visit: Payer: Medicare Other | Admitting: Physical Therapy

## 2016-10-20 LAB — T4 AND TSH
T4, Total: 9.9 ug/dL (ref 4.5–12.0)
TSH: 0.815 u[IU]/mL (ref 0.450–4.500)

## 2016-10-21 ENCOUNTER — Encounter: Payer: Medicare Other | Admitting: Physical Therapy

## 2016-10-21 ENCOUNTER — Ambulatory Visit: Payer: Medicare Other | Admitting: Physical Therapy

## 2016-10-26 ENCOUNTER — Encounter: Payer: Self-pay | Admitting: Physical Therapy

## 2016-10-26 ENCOUNTER — Ambulatory Visit: Payer: Medicare Other | Admitting: Physical Therapy

## 2016-10-26 DIAGNOSIS — Z9181 History of falling: Secondary | ICD-10-CM

## 2016-10-26 DIAGNOSIS — R262 Difficulty in walking, not elsewhere classified: Secondary | ICD-10-CM

## 2016-10-26 DIAGNOSIS — M6281 Muscle weakness (generalized): Secondary | ICD-10-CM | POA: Diagnosis not present

## 2016-10-26 DIAGNOSIS — R42 Dizziness and giddiness: Secondary | ICD-10-CM | POA: Diagnosis not present

## 2016-10-26 NOTE — Therapy (Signed)
[facility] [address] [place], [address] Phone: [phone]   Fax:  [phone]  Physical Therapy Treatment  Patient Details  Name: Sonya Cordova MRN: [id number] Date of Birth: 1946/03/15 Referring Provider: Derrel Nip  Encounter Date: 10/26/2016      PT End of Session - 10/26/16 0923    Visit Number 3   Number of Visits 13   Date for PT Re-Evaluation 2016/12/14   Authorization Type g codes   Authorization - Visit Number 3   PT Start Time [id number]   PT Stop Time 0920   PT Time Calculation (min) 48 min   Equipment Utilized During Treatment Gait belt   Activity Tolerance Patient tolerated treatment well;No increased pain   Behavior During Therapy WFL for tasks assessed/performed      Past Medical History:  Diagnosis Date  . Anemia   . Arthritis   . Cancer (HCC)    Basal cell  . Complication of anesthesia    "states remembers surgeon talking- was light"  . Connective tissue disease ([place])   . Contact lens/glasses fitting    wears contacts or glasses  . Degenerative disc disease 1995  . Edema of both legs   . Family history of anesthesia complication   . Headache(784.0)    migraines  . History of blood transfusion   . Hypothyroidism   . Lymphedema   . Neuropathy ([place])   . Osteopenia 02/2012   t score -1.1  . PONV (postoperative nausea and vomiting)     Past Surgical History:  Procedure Laterality Date  . abdominal plasty    . APPENDECTOMY    . BACK SURGERY     neck,, cervical, fusion 1995-2010 ( 17 surgeries)  . BREAST ENHANCEMENT SURGERY  1978  . EYE SURGERY     as a child  . HAMMER TOE SURGERY    . hemorrhoid    . KNEE ARTHROSCOPY    . knee replace    . left elbow    . left wrist    . SPINAL CORD STIMULATOR IMPLANT  2011  . THYROIDECTOMY  1986  . TONSILLECTOMY AND ADENOIDECTOMY    . TOTAL KNEE ARTHROPLASTY     2 times right  . TOTAL KNEE ARTHROPLASTY  05/22/2012   Procedure:  TOTAL KNEE ARTHROPLASTY;  Surgeon: Gearlean Alf, MD;  Location: [facility];  Service: Orthopedics;  Laterality: Left;  . TOTAL SHOULDER REPLACEMENT     2 times right shoulder  . TUBAL LIGATION    . ULNAR NERVE TRANSPOSITION Left 06/13/2013   Procedure: DECOMPRESSION, POSSIBLE TRANSPOSITION LEFT ULNAR NERVE;  Surgeon: Wynonia Sours, MD;  Location: [facility];  Service: Orthopedics;  Laterality: Left;  Marland Kitchen VAGINAL HYSTERECTOMY  1986    There were no vitals filed for this visit.      Subjective Assessment - 10/26/16 0926    Subjective Pt states that she has been compliant with HEP since last visit saying that her exercises are going well and that she is feeling "a little more stable." She states that her shoulders, hips and left knee have been painful lately not associated with therapy or HEP but because "they always do."   Pertinent History Recent fall 5 wks ago, pt reports she is not "overly fearful" of falling.   Limitations Sitting;Walking;Standing   How long can you sit comfortably? difficulty sitting in a pew   Diagnostic tests CT following fall - dx with no  concussion   Patient Stated Goals improved balance, to be able to sit.   Pain Onset More than a month ago      Objective: Pt. Ambulated 176m, 60 of which with a 5# weight in right hand to activate core and back musculature.  Pall-off press with RTB to strengthen trunk musculature with hands against chest to prevent shoulder pain 3 x 30" holds. Pt stated that she felt a good exercise in her side. Verbal cuing to not allow RTB to rotate her trunk but actively resist it.   Seated on foam pad L step ups on 4" box to facilitate hip flexion to allow patient to lift leg into car without UE support. Pt stated that she felt a good hip and back exercise. 3x10. Pt required verbal cuing to activate core muscles before activity to be able to perform exercise adequately.  Seated marches to facilitate hip flexion. Pt required verbal  cuing to not extend trunk to raise hip but to actively flex hip as well as tactile cuing to fire hip flexors. Pt stated that this was a challenging exercise 3x10 each leg.  3x30" holds SL balance exercise with stand by assist and gait belt and stable parallel bar if needed. Pt was able to achieve center of balance but lost balance ~every 10 seconds. Pt stated she felt a "workout" in her hip musculature and ankle musculature.                        PT Education - 10/26/16 0944    Education provided Yes   Education Details HEP   Person(s) Educated Patient   Methods Explanation;Demonstration   Comprehension Verbalized understanding;Returned demonstration             PT Long Term Goals - 10/11/16 1041      PT LONG TERM GOAL #1   Title Pts Berg balance score will increase to a 50 in 6 weeks so she can safely ambulate in the community.   Baseline 25   Time 6   Period Weeks   Status New     PT LONG TERM GOAL #2   Title Pts LEFS score will increase to 50 in 6 weeks so she can perform ADLs safely and without fear of falling.   Baseline 25   Time 6   Period Weeks   Status New     PT LONG TERM GOAL #3   Title Pt will be able to safely return to community exercise classes in 6 weeks to work on overall health and conditioning.   Baseline Pt is not participating in exercises due to fear of falling/reinjury.   Time 6   Period Weeks   Status New               Plan - 10/26/16 0944    Clinical Impression Statement Pts LE strength continues to be decreased bilaterally L > R. Upon detailed gait assessment gait continues to be shuffling and slow with decreased knee bend on L and increased trunk rotation to L. Continue LE strengthening and progress where appropriate.   Rehab Potential Fair   Clinical Impairments Affecting Rehab Potential medical comorbidities/age, active (with horses), community support.   PT Frequency 2x / week   PT Duration 6 weeks   PT  Treatment/Interventions ADLs/Self Care Home Management;Aquatic Therapy;Gait training;Stair training;Therapeutic exercise;Therapeutic activities;Balance training;Patient/family education;Manual techniques   PT Next Visit Plan advance HEP where appropritate   Consulted and Agree with Plan  of Care Patient      Patient will benefit from skilled therapeutic intervention in order to improve the following deficits and impairments:  Dizziness, Postural dysfunction, Improper body mechanics, Abnormal gait, Decreased balance, Decreased activity tolerance, Decreased endurance, Decreased range of motion, Decreased strength, Hypomobility, Impaired flexibility, Difficulty walking, Impaired UE functional use  Visit Diagnosis: History of falling  Muscle weakness (generalized)  Difficulty in walking, not elsewhere classified  Dizziness and giddiness     Problem List Patient Active Problem List   Diagnosis Date Noted  . Laceration without foreign body of scalp, initial encounter 09/16/2016  . Loss of balance 09/16/2016  . Abnormal CT scan, head 09/16/2016  . Insomnia secondary to anxiety 09/04/2016  . Allergic rhinitis 07/06/2016  . 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 Medicare annual wellness visit, subsequent 01/19/2015  . TMJ (temporomandibular joint syndrome) 09/23/2013  . Polyarthritis 09/20/2013  . Hypothyroidism 09/20/2013  . Edema of both legs 11/01/2012  . OA (osteoarthritis) of knee 05/22/2012  . Degeneration of lumbar or lumbosacral intervertebral disc   . Arthritis   . Connective tissue disease ([place])   . Osteopenia     Fisher,Benjamin PT DPT 10/26/2016, 9:51 AM  [facility] [address], [place], [address] Phone: [phone]   Fax:  [phone]  Name: Sonya Cordova MRN: [id number] Date of Birth: [birth year]

## 2016-10-27 DIAGNOSIS — G894 Chronic pain syndrome: Secondary | ICD-10-CM | POA: Diagnosis not present

## 2016-10-27 DIAGNOSIS — G4700 Insomnia, unspecified: Secondary | ICD-10-CM | POA: Diagnosis not present

## 2016-10-27 DIAGNOSIS — Z79891 Long term (current) use of opiate analgesic: Secondary | ICD-10-CM | POA: Diagnosis not present

## 2016-10-27 DIAGNOSIS — M961 Postlaminectomy syndrome, not elsewhere classified: Secondary | ICD-10-CM | POA: Diagnosis not present

## 2016-10-28 ENCOUNTER — Encounter: Payer: Self-pay | Admitting: Physical Therapy

## 2016-10-28 ENCOUNTER — Telehealth: Payer: Self-pay | Admitting: Internal Medicine

## 2016-10-28 ENCOUNTER — Ambulatory Visit: Payer: Medicare Other | Admitting: Physical Therapy

## 2016-10-28 DIAGNOSIS — R262 Difficulty in walking, not elsewhere classified: Secondary | ICD-10-CM

## 2016-10-28 DIAGNOSIS — R42 Dizziness and giddiness: Secondary | ICD-10-CM | POA: Diagnosis not present

## 2016-10-28 DIAGNOSIS — Z9181 History of falling: Secondary | ICD-10-CM | POA: Diagnosis not present

## 2016-10-28 DIAGNOSIS — M6281 Muscle weakness (generalized): Secondary | ICD-10-CM

## 2016-10-28 NOTE — Therapy (Signed)
[facility] [address] [place], [address] Phone: [phone]   Fax:  [phone]  Physical Therapy Treatment  Patient Details  Name: Sonya Cordova MRN: [id number] Date of Birth: [birth year] Referring Provider: Derrel Nip  Encounter Date: 10/28/2016      PT End of Session - 10/28/16 1502    Visit Number 4   Number of Visits 13   Date for PT Re-Evaluation 12/01/16   Authorization Type g codes   Authorization - Visit Number 4   PT Start Time [id number]   PT Stop Time 1427   PT Time Calculation (min) 42 min   Equipment Utilized During Treatment Gait belt   Activity Tolerance Patient tolerated treatment well;No increased pain   Behavior During Therapy WFL for tasks assessed/performed      Past Medical History:  Diagnosis Date  . Anemia   . Arthritis   . Cancer (HCC)    Basal cell  . Complication of anesthesia    "states remembers surgeon talking- was light"  . Connective tissue disease ([place])   . Contact lens/glasses fitting    wears contacts or glasses  . Degenerative disc disease 1995  . Edema of both legs   . Family history of anesthesia complication   . Headache(784.0)    migraines  . History of blood transfusion   . Hypothyroidism   . Lymphedema   . Neuropathy ([place])   . Osteopenia 02/2012   t score -1.1  . PONV (postoperative nausea and vomiting)     Past Surgical History:  Procedure Laterality Date  . abdominal plasty    . APPENDECTOMY    . BACK SURGERY     neck,, cervical, fusion 1995-2010 ( 17 surgeries)  . BREAST ENHANCEMENT SURGERY  1978  . EYE SURGERY     as a child  . HAMMER TOE SURGERY    . hemorrhoid    . KNEE ARTHROSCOPY    . knee replace    . left elbow    . left wrist    . SPINAL CORD STIMULATOR IMPLANT  2011  . THYROIDECTOMY  1986  . TONSILLECTOMY AND ADENOIDECTOMY    . TOTAL KNEE ARTHROPLASTY     2 times right  . TOTAL KNEE ARTHROPLASTY  05/22/2012   Procedure:  TOTAL KNEE ARTHROPLASTY;  Surgeon: Gearlean Alf, MD;  Location: [facility];  Service: Orthopedics;  Laterality: Left;  . TOTAL SHOULDER REPLACEMENT     2 times right shoulder  . TUBAL LIGATION    . ULNAR NERVE TRANSPOSITION Left 06/13/2013   Procedure: DECOMPRESSION, POSSIBLE TRANSPOSITION LEFT ULNAR NERVE;  Surgeon: Wynonia Sours, MD;  Location: [facility];  Service: Orthopedics;  Laterality: Left;  Marland Kitchen VAGINAL HYSTERECTOMY  1986    There were no vitals filed for this visit.      Subjective Assessment - 10/28/16 1354    Subjective Pt states that her home exercises are going well and she has been compliant, she states that she is still getting a good exercise from them. She states the seated hip flexion exercises are still difficult however she feels that she is getting stronger and she is able to do more without needing a rest or fatiguing. Pt states that she believes that the current plan of care continuing strengtening is the best option and that she is "on the right path"   Pertinent History Recent fall 5 wks ago, pt reports she is not "overly fearful"  of falling.   Limitations Sitting;Walking;Standing   How long can you sit comfortably? difficulty sitting in a pew   Diagnostic tests CT following fall - dx with no concussion   Patient Stated Goals improved balance, to be able to sit.   Pain Onset More than a month ago      Total gym 3x10 level 22 single leg to strengthen L quad musculature. Pt was able to independently get onto machine and required verbal cuing to eccentrically lower self down.  LP machine 2x10 15#. Pt required UE support to position L LE onto machine and required verbal cuing to eccentrically lower plate toward self.  Standing in front of stairs with stand by assist pt. alternated tapping each foot to 2nd step. Pt was able to maintain balance only needing to grab rails twice.  Seated hip flexion onto 6 inch cone. Pt stated this was a good exercise and  stated she felt a good burn in thigh and core. Pt was able to maintain more trunk control opposed to last visit.  Pt required continual verbal and tactile cuing to activate trunk musculature and eccentrically contract quads during exercises.                                PT Long Term Goals - 10/11/16 1041      PT LONG TERM GOAL #1   Title Pts Berg balance score will increase to a 50 in 6 weeks so she can safely ambulate in the community.   Baseline 25   Time 6   Period Weeks   Status New     PT LONG TERM GOAL #2   Title Pts LEFS score will increase to 50 in 6 weeks so she can perform ADLs safely and without fear of falling.   Baseline 25   Time 6   Period Weeks   Status New     PT LONG TERM GOAL #3   Title Pt will be able to safely return to community exercise classes in 6 weeks to work on overall health and conditioning.   Baseline Pt is not participating in exercises due to fear of falling/reinjury.   Time 6   Period Weeks   Status New               Plan - 10/28/16 1406    Clinical Impression Statement Pts LE strength while continuing to be decreased is notably improving. Pt was able to independently lift leg onto total gym machine without use of UE. She also displayed increased balance and stability during taps onto stairs only requiring stand by assist opposed to last time requiring min assist.   Rehab Potential Fair   Clinical Impairments Affecting Rehab Potential medical comorbidities/age, active (with horses), community support.   PT Frequency 2x / week   PT Duration 6 weeks   PT Treatment/Interventions ADLs/Self Care Home Management;Aquatic Therapy;Gait training;Stair training;Therapeutic exercise;Therapeutic activities;Balance training;Patient/family education;Manual techniques   PT Next Visit Plan advance HEP where appropritate   Consulted and Agree with Plan of Care Patient      Patient will benefit from skilled therapeutic  intervention in order to improve the following deficits and impairments:  Dizziness, Postural dysfunction, Improper body mechanics, Abnormal gait, Decreased balance, Decreased activity tolerance, Decreased endurance, Decreased range of motion, Decreased strength, Hypomobility, Impaired flexibility, Difficulty walking, Impaired UE functional use  Visit Diagnosis: History of falling  Muscle weakness (generalized)  Difficulty in  walking, not elsewhere classified     Problem List Patient Active Problem List   Diagnosis Date Noted  . Laceration without foreign body of scalp, initial encounter 09/16/2016  . Loss of balance 09/16/2016  . Abnormal CT scan, head 09/16/2016  . Insomnia secondary to anxiety 09/04/2016  . Allergic rhinitis 07/06/2016  . 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 Medicare annual wellness visit, subsequent 01/19/2015  . TMJ (temporomandibular joint syndrome) 09/23/2013  . Polyarthritis 09/20/2013  . Hypothyroidism 09/20/2013  . Edema of both legs 11/01/2012  . OA (osteoarthritis) of knee 05/22/2012  . Degeneration of lumbar or lumbosacral intervertebral disc   . Arthritis   . Connective tissue disease ([place])   . Osteopenia     Fisher,Benjamin PT DPT  10/28/2016, 5:19 PM  [facility] [address], [place], [address] Phone: [phone]   Fax:  [phone]  Name: Sonya Cordova MRN: [id number] Date of Birth: 10-31-1945

## 2016-10-29 NOTE — Telephone Encounter (Signed)
Pt last refill on Valium was 10/09/15. Ok to refill?

## 2016-10-29 NOTE — Telephone Encounter (Signed)
Printed Kerin Perna

## 2016-10-30 ENCOUNTER — Other Ambulatory Visit: Payer: Self-pay | Admitting: Internal Medicine

## 2016-11-01 NOTE — Telephone Encounter (Signed)
Change pharmacy from [address] to [facility] [address]

## 2016-11-01 NOTE — Telephone Encounter (Signed)
Called and Cx Diazepam sent to [facility] in [place] and per patient request sent medication to [facility] [address]

## 2016-11-01 NOTE — Telephone Encounter (Signed)
The patient left a voicemail stating that her Diazepam was sent to the wrong pharmacy and because this is a controlled substance she is needing someone to send it to the correct pharmacy. It should have been sent to the neighborhood [facility] on [address]. She is completely out of her medication and she is needing this done by the end of today.

## 2016-11-02 ENCOUNTER — Encounter: Payer: Medicare Other | Admitting: Physical Therapy

## 2016-11-04 ENCOUNTER — Encounter: Payer: Medicare Other | Admitting: Physical Therapy

## 2016-11-09 ENCOUNTER — Encounter: Payer: Self-pay | Admitting: Physical Therapy

## 2016-11-09 ENCOUNTER — Ambulatory Visit: Payer: Medicare Other | Attending: Internal Medicine | Admitting: Physical Therapy

## 2016-11-09 DIAGNOSIS — R262 Difficulty in walking, not elsewhere classified: Secondary | ICD-10-CM | POA: Diagnosis not present

## 2016-11-09 DIAGNOSIS — Z9181 History of falling: Secondary | ICD-10-CM | POA: Insufficient documentation

## 2016-11-09 DIAGNOSIS — M6281 Muscle weakness (generalized): Secondary | ICD-10-CM | POA: Insufficient documentation

## 2016-11-10 NOTE — Therapy (Signed)
[facility] [address], [place], [address] Phone: [phone]   Fax:  [phone]  Physical Therapy Treatment  Patient Details  Name: Sonya Cordova MRN: [id number] Date of Birth: Sep 27, 1946 Referring Provider: Derrel Nip  Encounter Date: 11/09/2016      PT End of Session - 11/09/16 1159    Visit Number 5   Number of Visits 13   Date for PT Re-Evaluation 11-24-2016   Authorization Type g codes   Authorization - Visit Number 5   PT Start Time [id number]   PT Stop Time [id number]   PT Time Calculation (min) 41 min   Equipment Utilized During Treatment Gait belt   Activity Tolerance Patient tolerated treatment well;No increased pain   Behavior During Therapy WFL for tasks assessed/performed      Past Medical History:  Diagnosis Date  . Anemia   . Arthritis   . Cancer (HCC)    Basal cell  . Complication of anesthesia    "states remembers surgeon talking- was light"  . Connective tissue disease ([place])   . Contact lens/glasses fitting    wears contacts or glasses  . Degenerative disc disease 1995  . Edema of both legs   . Family history of anesthesia complication   . Headache(784.0)    migraines  . History of blood transfusion   . Hypothyroidism   . Lymphedema   . Neuropathy ([place])   . Osteopenia 02/2012   t score -1.1  . PONV (postoperative nausea and vomiting)     Past Surgical History:  Procedure Laterality Date  . abdominal plasty    . APPENDECTOMY    . BACK SURGERY     neck,, cervical, fusion 1995-2010 ( 17 surgeries)  . BREAST ENHANCEMENT SURGERY  1978  . EYE SURGERY     as a child  . HAMMER TOE SURGERY    . hemorrhoid    . KNEE ARTHROSCOPY    . knee replace    . left elbow    . left wrist    . SPINAL CORD STIMULATOR IMPLANT  2011  . THYROIDECTOMY  1986  . TONSILLECTOMY AND ADENOIDECTOMY    . TOTAL KNEE ARTHROPLASTY     2 times right  . TOTAL KNEE ARTHROPLASTY  05/22/2012   Procedure:  TOTAL KNEE ARTHROPLASTY;  Surgeon: Gearlean Alf, MD;  [facility];  Service: Orthopedics;  Laterality: Left;  . TOTAL SHOULDER REPLACEMENT     2 times right shoulder  . TUBAL LIGATION    . ULNAR NERVE TRANSPOSITION Left 06/13/2013   Procedure: DECOMPRESSION, POSSIBLE TRANSPOSITION LEFT ULNAR NERVE;  Surgeon: Wynonia Sours, MD;  Location: [facility];  Service: Orthopedics;  Laterality: Left;  Marland Kitchen VAGINAL HYSTERECTOMY  1986    There were no vitals filed for this visit.      Subjective Assessment - 11/09/16 1200    Subjective Pt states that her HEP is still a challenge and that she is still completing them regularly. She states that she can tell her legs are getting stronger and that it has made a positive impact in terms of her balance and stability. She states that she feels less unsteady on her feet and is able to perform basic ADLs without fear of falling.   Pertinent History Recent fall 5 wks ago, pt reports she is not "overly fearful" of falling.   Limitations Sitting;Walking;Standing   How long can you sit comfortably? difficulty sitting in a  pew   Diagnostic tests CT following fall - dx with no concussion   Patient Stated Goals improved balance, to be able to sit.   Pain Score 0-No pain   Pain Onset More than a month ago      Therapeutic exercise program:  -Single leg LP 2x12 25# each leg  -Omega knee extension machine 2x12 10# -Omega hamstring curls 2x15 20# -Tapping on 12 inch step 2x15 each leg alternating legs  -6" step up and overs 2x6 min assist with L leg on step -Heel raises 2x12  Pt required stand by assist throughout treatment session and is responding well to strengthening exercises. Pt verbalized that she feels more stable on her feet and is feeling stronger.  Pt requires skilled therapy to make alterations to exercises to account for several orthopaedic restrictions pt presents with. Pt requires min assist to safely position on Omega  machine.  Proper footwear discussed with patient with emphasis on wearing closed toe well fitting shoes opposed to shoes that can easily slide off and contribute to a fall.   Pt tolerated treatments well and stated that her legs were fatigued after. She stated that she could tell she got a "good workout"                                PT Long Term Goals - 10/11/16 1041      PT LONG TERM GOAL #1   Title Pts Berg balance score will increase to a 50 in 6 weeks so she can safely ambulate in the community.   Baseline 25   Time 6   Period Weeks   Status New     PT LONG TERM GOAL #2   Title Pts LEFS score will increase to 50 in 6 weeks so she can perform ADLs safely and without fear of falling.   Baseline 25   Time 6   Period Weeks   Status New     PT LONG TERM GOAL #3   Title Pt will be able to safely return to community exercise classes in 6 weeks to work on overall health and conditioning.   Baseline Pt is not participating in exercises due to fear of falling/reinjury.   Time 6   Period Weeks   Status New               Plan - 11/09/16 1207    Clinical Impression Statement Pt is noticably more steady on her feet while ambulating. Her stride length and cadence are both drastically improved. Pt LE strength continues to improve especially during eccentric portions of exercises. Pt verbalized that she can tell her LE strength is improving and she is feeling more stable and steady on her feet. Time to fatigue with gait and exercises is improving demonstrating increasing endurance. Continue LE strengthening program.   Rehab Potential Fair   Clinical Impairments Affecting Rehab Potential medical comorbidities/age, active (with horses), community support.   PT Frequency 2x / week   PT Duration 6 weeks   PT Treatment/Interventions ADLs/Self Care Home Management;Aquatic Therapy;Gait training;Stair training;Therapeutic exercise;Therapeutic activities;Balance  training;Patient/family education;Manual techniques   PT Next Visit Plan advance HEP where appropritate   Consulted and Agree with Plan of Care Patient      Patient will benefit from skilled therapeutic intervention in order to improve the following deficits and impairments:  Dizziness, Postural dysfunction, Improper body mechanics, Abnormal gait, Decreased balance, Decreased activity tolerance,  Decreased endurance, Decreased range of motion, Decreased strength, Hypomobility, Impaired flexibility, Difficulty walking, Impaired UE functional use  Visit Diagnosis: Muscle weakness (generalized)  Difficulty in walking, not elsewhere classified  History of falling     Problem List Patient Active Problem List   Diagnosis Date Noted  . Laceration without foreign body of scalp, initial encounter 09/16/2016  . Loss of balance 09/16/2016  . Abnormal CT scan, head 09/16/2016  . Insomnia secondary to anxiety 09/04/2016  . Allergic rhinitis 07/06/2016  . 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 Medicare annual wellness visit, subsequent 01/19/2015  . TMJ (temporomandibular joint syndrome) 09/23/2013  . Polyarthritis 09/20/2013  . Hypothyroidism 09/20/2013  . Edema of both legs 11/01/2012  . OA (osteoarthritis) of knee 05/22/2012  . Degeneration of lumbar or lumbosacral intervertebral disc   . Arthritis   . Connective tissue disease ([place])   . Osteopenia    Chaffin, Logan SPT Nerea Bordenave PT DPT 11/10/2016, 7:25 AM  [facility] [address], [place], [address] Phone: [phone]   Fax:  [phone]  Name: KESLIE SAILE MRN: [id number] Date of Birth: 1946/05/10

## 2016-11-11 ENCOUNTER — Ambulatory Visit: Payer: Medicare Other | Admitting: Physical Therapy

## 2016-11-11 DIAGNOSIS — Z4789 Encounter for other orthopedic aftercare: Secondary | ICD-10-CM | POA: Diagnosis not present

## 2016-11-16 ENCOUNTER — Ambulatory Visit: Payer: Medicare Other | Admitting: Physical Therapy

## 2016-11-18 ENCOUNTER — Ambulatory Visit: Payer: Medicare Other | Admitting: Physical Therapy

## 2016-11-18 ENCOUNTER — Encounter: Payer: Self-pay | Admitting: Physical Therapy

## 2016-11-18 DIAGNOSIS — M6281 Muscle weakness (generalized): Secondary | ICD-10-CM | POA: Diagnosis not present

## 2016-11-18 DIAGNOSIS — Z9181 History of falling: Secondary | ICD-10-CM

## 2016-11-18 DIAGNOSIS — R262 Difficulty in walking, not elsewhere classified: Secondary | ICD-10-CM | POA: Diagnosis not present

## 2016-11-18 NOTE — Therapy (Signed)
[facility] [address], [place], [address] Phone: [phone]   Fax:  [phone]  Physical Therapy Treatment  Patient Details  Name: Sonya Cordova MRN: [id number] Date of Birth: 1946-10-03 Referring Provider: Derrel Nip  Encounter Date: 11/18/2016      PT End of Session - 11/18/16 1235    Visit Number 6   Number of Visits 13   Date for PT Re-Evaluation 22-Dec-2016   Authorization Type g codes   Authorization - Visit Number 6   PT Start Time 9470   PT Stop Time 1233   PT Time Calculation (min) 45 min   Equipment Utilized During Treatment Gait belt   Activity Tolerance Patient tolerated treatment well;No increased pain   Behavior During Therapy WFL for tasks assessed/performed      Past Medical History:  Diagnosis Date  . Anemia   . Arthritis   . Cancer (HCC)    Basal cell  . Complication of anesthesia    "states remembers surgeon talking- was light"  . Connective tissue disease ([place])   . Contact lens/glasses fitting    wears contacts or glasses  . Degenerative disc disease 1995  . Edema of both legs   . Family history of anesthesia complication   . Headache(784.0)    migraines  . History of blood transfusion   . Hypothyroidism   . Lymphedema   . Neuropathy ([place])   . Osteopenia 02/2012   t score -1.1  . PONV (postoperative nausea and vomiting)     Past Surgical History:  Procedure Laterality Date  . abdominal plasty    . APPENDECTOMY    . BACK SURGERY     neck,, cervical, fusion 1995-2010 ( 17 surgeries)  . BREAST ENHANCEMENT SURGERY  1978  . EYE SURGERY     as a child  . HAMMER TOE SURGERY    . hemorrhoid    . KNEE ARTHROSCOPY    . knee replace    . left elbow    . left wrist    . SPINAL CORD STIMULATOR IMPLANT  2011  . THYROIDECTOMY  1986  . TONSILLECTOMY AND ADENOIDECTOMY    . TOTAL KNEE ARTHROPLASTY     2 times right  . TOTAL KNEE ARTHROPLASTY  05/22/2012   Procedure:  TOTAL KNEE ARTHROPLASTY;  Surgeon: Gearlean Alf, MD;  Location: [facility];  Service: Orthopedics;  Laterality: Left;  . TOTAL SHOULDER REPLACEMENT     2 times right shoulder  . TUBAL LIGATION    . ULNAR NERVE TRANSPOSITION Left 06/13/2013   Procedure: DECOMPRESSION, POSSIBLE TRANSPOSITION LEFT ULNAR NERVE;  Surgeon: Wynonia Sours, MD;  Location: [facility];  Service: Orthopedics;  Laterality: Left;  Marland Kitchen VAGINAL HYSTERECTOMY  1986    There were no vitals filed for this visit.      Subjective Assessment - 11/18/16 1232    Subjective Pt states that all of her orthopaedic issues have been "irritated lately" what she attributes to be a change in weather as well as having to sit in the car for 2 and a half hours last week. Pt states that she is improving and her pain is decreasing over the past week when the irritation occured. Pt states that she can tell that her legs are getting stronger and she is feeling more stable. She states that she only feels unstable when on uneven surface or ascending and descending stairs.   Pertinent History Recent fall 5  wks ago, pt reports she is not "overly fearful" of falling.   Limitations Sitting;Walking;Standing   How long can you sit comfortably? difficulty sitting in a pew   Diagnostic tests CT following fall - dx with no concussion   Patient Stated Goals improved balance, to be able to sit.   Currently in Pain? Yes   Pain Score 3    Pain Location Back   Pain Orientation Lower   Pain Descriptors / Indicators Aching;Sore   Pain Onset More than a month ago   Pain Frequency Occasional     Objectives: Standing tapping foot on 12 inch step with stand by assist. Pt stated exercise had become easy, progressed to pt standing on airex pad tapping 12 inch step with stand by assist. Pt struggled with balance portion of exercise and was fatigued by end of 2x12.  Holding 10# weights in each hand pt ascended and descended 4 stairs alternating steps to  simulate bringing in groceries which pt stated that she struggled with. Pt required stand by assist throughout. Due to unsteadiness PT suggested not alternating stairs while carrying groceries or any other weight ascending or descending steps, pt verbalized understanding and  stated she felt much safer not alternating steps.  Reassessment of berg balance test demonstrated increase to 50 from 25 on initial visit.   PT session limited due to fatigue and soreness from irritation of other orthopaedic injuries that occurred last week.                            PT Education - 11/18/16 1408    Education provided Yes   Education Details expectation of irritation with incr. activity.   Person(s) Educated Patient   Methods Explanation   Comprehension Verbalized understanding             PT Long Term Goals - 11/18/16 1243      PT LONG TERM GOAL #1   Title Pts Berg balance score will increase to a 50 in 6 weeks so she can safely ambulate in the community.   Baseline 25 on 10/11/16          35 on 11/18/16   Time 6   Period Weeks   Status Partially Met     PT LONG TERM GOAL #2   Title Pts LEFS score will increase to 50 in 6 weeks so she can perform ADLs safely and without fear of falling.   Baseline 25   Time 6   Period Weeks   Status Achieved     PT LONG TERM GOAL #3   Title Pt will be able to safely return to community exercise classes in 6 weeks to work on overall health and conditioning.   Baseline Pt is not participating in exercises due to fear of falling/reinjury.   Time 6   Period Weeks   Status On-going               Plan - 11/18/16 1236    Clinical Impression Statement Pt gait continues to improve and pt both appears more steady and pt expresses feeling more steady. Pts single leg balance continues to be decreased L>R which leads to fear of falling when ascending and descending stairs. Pt strength in LE continues to improve as demonstrated by  increase in weight and reps during exercise with same perceived level of exertion. Pt berg score increased to 50 and LEFS increased to 35   Rehab Potential  Fair   Clinical Impairments Affecting Rehab Potential medical comorbidities/age, active (with horses), community support.   PT Frequency 2x / week   PT Duration 6 weeks   PT Treatment/Interventions ADLs/Self Care Home Management;Aquatic Therapy;Gait training;Stair training;Therapeutic exercise;Therapeutic activities;Balance training;Patient/family education;Manual techniques   PT Next Visit Plan advance HEP where appropritate   Consulted and Agree with Plan of Care Patient      Patient will benefit from skilled therapeutic intervention in order to improve the following deficits and impairments:  Dizziness, Postural dysfunction, Improper body mechanics, Abnormal gait, Decreased balance, Decreased activity tolerance, Decreased endurance, Decreased range of motion, Decreased strength, Hypomobility, Impaired flexibility, Difficulty walking, Impaired UE functional use  Visit Diagnosis: Muscle weakness (generalized)  Difficulty in walking, not elsewhere classified  History of falling     Problem List Patient Active Problem List   Diagnosis Date Noted  . Laceration without foreign body of scalp, initial encounter 09/16/2016  . Loss of balance 09/16/2016  . Abnormal CT scan, head 09/16/2016  . Insomnia secondary to anxiety 09/04/2016  . Allergic rhinitis 07/06/2016  . 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 Medicare annual wellness visit, subsequent 01/19/2015  . TMJ (temporomandibular joint syndrome) 09/23/2013  . Polyarthritis 09/20/2013  . Hypothyroidism 09/20/2013  . Edema of both legs 11/01/2012  . OA (osteoarthritis) of knee 05/22/2012  . Degeneration of lumbar or lumbosacral intervertebral disc   . Arthritis   . Connective tissue disease ([place])   . Osteopenia     Collen Vincent PT  DPT 11/18/2016, 2:14 PM  [facility] [address], [place], [address] Phone: [phone]   Fax:  [phone]  Name: Sonya Cordova MRN: [id number] Date of Birth: Oct 10, 1945

## 2016-11-20 DIAGNOSIS — M25522 Pain in left elbow: Secondary | ICD-10-CM | POA: Diagnosis not present

## 2016-11-20 DIAGNOSIS — M19022 Primary osteoarthritis, left elbow: Secondary | ICD-10-CM | POA: Diagnosis not present

## 2016-11-21 DIAGNOSIS — J019 Acute sinusitis, unspecified: Secondary | ICD-10-CM | POA: Diagnosis not present

## 2016-11-21 DIAGNOSIS — H1031 Unspecified acute conjunctivitis, right eye: Secondary | ICD-10-CM | POA: Diagnosis not present

## 2016-11-23 ENCOUNTER — Ambulatory Visit: Payer: Medicare Other | Admitting: Physical Therapy

## 2016-11-24 DIAGNOSIS — M7542 Impingement syndrome of left shoulder: Secondary | ICD-10-CM | POA: Diagnosis not present

## 2016-11-24 DIAGNOSIS — Z79891 Long term (current) use of opiate analgesic: Secondary | ICD-10-CM | POA: Diagnosis not present

## 2016-11-24 DIAGNOSIS — G894 Chronic pain syndrome: Secondary | ICD-10-CM | POA: Diagnosis not present

## 2016-11-24 DIAGNOSIS — M961 Postlaminectomy syndrome, not elsewhere classified: Secondary | ICD-10-CM | POA: Diagnosis not present

## 2016-11-24 DIAGNOSIS — G4700 Insomnia, unspecified: Secondary | ICD-10-CM | POA: Diagnosis not present

## 2016-11-25 ENCOUNTER — Ambulatory Visit: Payer: Medicare Other | Admitting: Physical Therapy

## 2016-11-30 ENCOUNTER — Ambulatory Visit: Payer: Medicare Other | Admitting: Physical Therapy

## 2016-12-02 ENCOUNTER — Ambulatory Visit: Payer: Medicare Other | Admitting: Physical Therapy

## 2016-12-09 ENCOUNTER — Encounter: Payer: Medicare Other | Admitting: Physical Therapy

## 2016-12-13 ENCOUNTER — Other Ambulatory Visit: Payer: Self-pay | Admitting: Internal Medicine

## 2016-12-14 ENCOUNTER — Encounter: Payer: Medicare Other | Admitting: Physical Therapy

## 2016-12-15 DIAGNOSIS — M7542 Impingement syndrome of left shoulder: Secondary | ICD-10-CM | POA: Diagnosis not present

## 2016-12-15 DIAGNOSIS — M7502 Adhesive capsulitis of left shoulder: Secondary | ICD-10-CM | POA: Diagnosis not present

## 2016-12-15 DIAGNOSIS — Z96611 Presence of right artificial shoulder joint: Secondary | ICD-10-CM | POA: Diagnosis not present

## 2016-12-29 DIAGNOSIS — M961 Postlaminectomy syndrome, not elsewhere classified: Secondary | ICD-10-CM | POA: Diagnosis not present

## 2016-12-29 DIAGNOSIS — G894 Chronic pain syndrome: Secondary | ICD-10-CM | POA: Diagnosis not present

## 2016-12-29 DIAGNOSIS — G4700 Insomnia, unspecified: Secondary | ICD-10-CM | POA: Diagnosis not present

## 2016-12-29 DIAGNOSIS — Z79891 Long term (current) use of opiate analgesic: Secondary | ICD-10-CM | POA: Diagnosis not present

## 2017-01-12 DIAGNOSIS — M7542 Impingement syndrome of left shoulder: Secondary | ICD-10-CM | POA: Diagnosis not present

## 2017-01-24 DIAGNOSIS — M961 Postlaminectomy syndrome, not elsewhere classified: Secondary | ICD-10-CM | POA: Diagnosis not present

## 2017-01-24 DIAGNOSIS — G894 Chronic pain syndrome: Secondary | ICD-10-CM | POA: Diagnosis not present

## 2017-01-24 DIAGNOSIS — G4700 Insomnia, unspecified: Secondary | ICD-10-CM | POA: Diagnosis not present

## 2017-01-24 DIAGNOSIS — Z79891 Long term (current) use of opiate analgesic: Secondary | ICD-10-CM | POA: Diagnosis not present

## 2017-02-10 ENCOUNTER — Ambulatory Visit (INDEPENDENT_AMBULATORY_CARE_PROVIDER_SITE_OTHER): Payer: Medicare Other | Admitting: Sports Medicine

## 2017-02-10 VITALS — BP 107/51 | Ht 67.0 in | Wt 132.0 lb

## 2017-02-10 DIAGNOSIS — M25511 Pain in right shoulder: Secondary | ICD-10-CM | POA: Diagnosis present

## 2017-02-10 DIAGNOSIS — M19029 Primary osteoarthritis, unspecified elbow: Secondary | ICD-10-CM

## 2017-02-11 NOTE — Progress Notes (Signed)
   Subjective:    Patient ID: Sonya Cordova, female    DOB: [birth year], 71 y.o.   MRN: [id number]  HPI chief complaint: Left shoulder and left elbow pain  Very pleasant right-hand-dominant 71 year old female comes in today complaining of left shoulder and left elbow pain. Pain in both areas is chronic. She is referred today by her pain management doctor, Dr. Hardin Negus. This patient has an extensive history of previous orthopedic surgeries. She states that she has had a total of 22 surgeries including multiple back surgeries for scoliosis, 3 total knee replacements, and a total shoulder replacement on the right. In regards to the left shoulder, she has had pain along the lateral aspect of the shoulder for several months. No trauma that she can recall. She was seen at [facility] and has had 2 cortisone injections without relief. An ultrasound evaluation done at their office revealed a "tendon tear". She is unable to undergo an MRI of her left shoulder due to an implantable nerve stimulator for her chronic pain. Upon her last office visit she was told that she had adhesive capsulitis and that it would go away but she is here today for a second opinion. In regards to her left elbow, she fell many years ago suffering a radial head fracture which subsequently resulted in a radial head removal. She did well for multiple years but recently began to develop pain and a worsening ability to extend the elbow. She saw Dr. Amedeo Plenty at [facility] who recommended an elbow replacement. She is hesitant about proceeding with more orthopedic surgeries. She has a history of a previous ulnar nerve transposition in the same elbow many years ago.  Past medical history reviewed Medications reviewed Allergies reviewed    Review of Systems As above     Objective:   Physical Exam  Well-developed, well-nourished. No acute distress. Awake alert and oriented 3. Vital signs reviewed  Right shoulder:  Patient has nearly full range of motion. Good strength. Left shoulder: Nearly full active and passive range of motion. 4+/5 strength with resisted supraspinatus but 5/5 strength with resisted external rotation and internal rotation. She does have reproducible shoulder pain with resisted supraspinatus. No tenderness over the bicipital groove. No tenderness at the acromioclavicular joint. Left elbow: Severely limited extension. Full flexion. Full pronation and supination. No tenderness to palpation. No obvious effusion.       Assessment & Plan:  Left shoulder pain likely secondary to rotator cuff tendinopathy versus rotator cuff tear Left elbow pain secondary to DJD Status post right shoulder total shoulder replacement History of multiple orthopedic surgeries  I've asked the patient to obtain her x-rays from [facility] and drop them off at my office for review. I will then schedule a new ultrasound of her left shoulder. She does not show evidence of adhesive capsulitis and I do not think she has much arthritis. If a rotator cuff tear is confirmed on ultrasound then we may be able to offer her topical nitroglycerin as a treatment. In regards to her left elbow, this is obviously a difficult dilemma. If she does in fact have severe arthritis then I think her treatment options are limited. I did explain to her that Dr. Amedeo Plenty is very good and if he thinks that surgery would be beneficial then she should consider that. Unfortunately, I don't have much to add to the treatment of her elbow arthritis.

## 2017-02-22 DIAGNOSIS — M47813 Spondylosis without myelopathy or radiculopathy, cervicothoracic region: Secondary | ICD-10-CM | POA: Diagnosis not present

## 2017-02-22 DIAGNOSIS — Z79891 Long term (current) use of opiate analgesic: Secondary | ICD-10-CM | POA: Diagnosis not present

## 2017-02-22 DIAGNOSIS — G4700 Insomnia, unspecified: Secondary | ICD-10-CM | POA: Diagnosis not present

## 2017-02-22 DIAGNOSIS — M961 Postlaminectomy syndrome, not elsewhere classified: Secondary | ICD-10-CM | POA: Diagnosis not present

## 2017-02-22 DIAGNOSIS — G894 Chronic pain syndrome: Secondary | ICD-10-CM | POA: Diagnosis not present

## 2017-02-23 ENCOUNTER — Encounter: Payer: Self-pay | Admitting: Sports Medicine

## 2017-02-23 ENCOUNTER — Other Ambulatory Visit: Payer: Self-pay | Admitting: Internal Medicine

## 2017-02-23 ENCOUNTER — Ambulatory Visit (INDEPENDENT_AMBULATORY_CARE_PROVIDER_SITE_OTHER): Payer: Medicare Other | Admitting: Sports Medicine

## 2017-02-23 ENCOUNTER — Other Ambulatory Visit: Payer: Self-pay | Admitting: *Deleted

## 2017-02-23 VITALS — BP 112/55 | Ht 67.0 in | Wt 132.0 lb

## 2017-02-23 DIAGNOSIS — M25511 Pain in right shoulder: Secondary | ICD-10-CM | POA: Diagnosis not present

## 2017-02-23 MED ORDER — NITROGLYCERIN 0.2 MG/HR TD PT24: MEDICATED_PATCH | TRANSDERMAL | 1 refills | Status: DC

## 2017-02-23 NOTE — Patient Instructions (Signed)

## 2017-02-23 NOTE — Progress Notes (Signed)
   Subjective:    Patient ID: Sonya Cordova, female    DOB: 10-20-1945, 71 y.o.   MRN: [id number]  HPI   Patient comes in today for follow-up on left shoulder pain. I was able to review her x-rays from [facility]. I was also able to review the ultrasound done at [facility] on her left shoulder. X-rays show minimal degenerative changes. Ultrasound shows a tear through the supraspinatus tendon but the remainder of her shoulder ultrasound is fairly unremarkable. She continues to endorse left shoulder pain which has been present now for several months. She is here today for repeat ultrasound and to discuss further treatment.    Review of Systems     Objective:   Physical Exam  Well-developed, well-nourished. No acute distress.  Left shoulder: Patient continues to have full active and passive range of motion. 4+/5 strength with resisted supraspinatus but 5/5 strength with remainder of rotator cuff strength testing. Neurovascularly intact distally.  MS[facility] ultrasound of the left shoulder was performed. I limited my study today to the supraspinatus tendon since her previous ultrasound was well done and showed no other pathology. Today's scan of the supraspinatus shows an area of hypoechoic change along the articular surface side of the anterior edge of the tendon just proximal to the insertion onto the humerus. This can be seen in long and short axis views. This is consistent with a partial thickness articular sided supraspinatus tendon tear.      Assessment & Plan:   Left shoulder pain secondary to partial thickness rotator cuff tear  Patient has had multiple orthopedic surgeries in the past. She wants to avoid surgery at all costs if possible. We will try a quarter patch of topical nitroglycerin and have her couple this with a rotator cuff home exercise program. Follow-up with me in 4 weeks for reevaluation and repeat scan. This patient is unable to undergo MRI of her  left shoulder due to an implantable nerve stimulator that she has for chronic pain.

## 2017-03-16 ENCOUNTER — Other Ambulatory Visit: Payer: Self-pay | Admitting: Anesthesiology

## 2017-03-16 DIAGNOSIS — M47813 Spondylosis without myelopathy or radiculopathy, cervicothoracic region: Secondary | ICD-10-CM

## 2017-03-17 DIAGNOSIS — M7052 Other bursitis of knee, left knee: Secondary | ICD-10-CM | POA: Diagnosis not present

## 2017-03-17 DIAGNOSIS — M7061 Trochanteric bursitis, right hip: Secondary | ICD-10-CM | POA: Diagnosis not present

## 2017-03-21 ENCOUNTER — Ambulatory Visit: Payer: Medicare Other | Admitting: Sports Medicine

## 2017-03-22 ENCOUNTER — Ambulatory Visit
Admission: RE | Admit: 2017-03-22 | Discharge: 2017-03-22 | Disposition: A | Discharge status: Home / Self Care | Payer: Medicare Other | Source: Ambulatory Visit | Attending: Anesthesiology | Admitting: Anesthesiology

## 2017-03-22 DIAGNOSIS — M961 Postlaminectomy syndrome, not elsewhere classified: Secondary | ICD-10-CM | POA: Diagnosis not present

## 2017-03-22 DIAGNOSIS — Z79891 Long term (current) use of opiate analgesic: Secondary | ICD-10-CM | POA: Diagnosis not present

## 2017-03-22 DIAGNOSIS — G894 Chronic pain syndrome: Secondary | ICD-10-CM | POA: Diagnosis not present

## 2017-03-22 DIAGNOSIS — G4700 Insomnia, unspecified: Secondary | ICD-10-CM | POA: Diagnosis not present

## 2017-03-22 DIAGNOSIS — M47813 Spondylosis without myelopathy or radiculopathy, cervicothoracic region: Secondary | ICD-10-CM

## 2017-03-22 DIAGNOSIS — M5021 Other cervical disc displacement,  high cervical region: Secondary | ICD-10-CM | POA: Diagnosis not present

## 2017-03-23 ENCOUNTER — Other Ambulatory Visit: Payer: Self-pay | Admitting: Family Medicine

## 2017-04-12 ENCOUNTER — Ambulatory Visit: Payer: Medicare Other | Admitting: Sports Medicine

## 2017-04-19 DIAGNOSIS — G4700 Insomnia, unspecified: Secondary | ICD-10-CM | POA: Diagnosis not present

## 2017-04-19 DIAGNOSIS — G894 Chronic pain syndrome: Secondary | ICD-10-CM | POA: Diagnosis not present

## 2017-04-19 DIAGNOSIS — Z79891 Long term (current) use of opiate analgesic: Secondary | ICD-10-CM | POA: Diagnosis not present

## 2017-04-19 DIAGNOSIS — M961 Postlaminectomy syndrome, not elsewhere classified: Secondary | ICD-10-CM | POA: Diagnosis not present

## 2017-04-20 ENCOUNTER — Other Ambulatory Visit: Payer: Self-pay | Admitting: Internal Medicine

## 2017-04-28 ENCOUNTER — Other Ambulatory Visit: Payer: Self-pay | Admitting: Internal Medicine

## 2017-04-28 NOTE — Telephone Encounter (Signed)
Please notify patient that the diazepam was refilled for 30 days only because it is a controlled substances and requires a  6 month follow up.  OFFICE VISIT NEEDED prior to any more refills

## 2017-04-28 NOTE — Telephone Encounter (Signed)
Refilled: 10/29/2016 Last OV: 09/14/2016 Next OV: not scheduled

## 2017-04-28 NOTE — Telephone Encounter (Signed)
LMTCB. Need schedule pt a follow up appt with Dr. Derrel Nip.   Rx has been printed, signed and faxed.

## 2017-05-02 ENCOUNTER — Other Ambulatory Visit: Payer: Self-pay | Admitting: Internal Medicine

## 2017-05-27 ENCOUNTER — Telehealth: Payer: Self-pay | Admitting: Internal Medicine

## 2017-05-27 NOTE — Telephone Encounter (Signed)
Spoke with pharmacy and they stated that they have the rx that was sent in on 05/03/2017 and that they would get it refilled for her. Spoke with the pt and informed her of what the pharmacy told me. Pt stated thank you and that she would go get the rx today.

## 2017-05-27 NOTE — Telephone Encounter (Signed)
Pt called and is requesting a refill on her SYNTHROID 112 MCG tablet. She is leaving to go out of town this afternoon. She spoke with pharmacist and stated that we can call it in and he will put a fast track on it. Please advise, thank you!  Pharmacy - [place], [place]  Call pt @ [phone]

## 2017-05-30 ENCOUNTER — Other Ambulatory Visit: Payer: Self-pay | Admitting: Internal Medicine

## 2017-05-31 ENCOUNTER — Telehealth: Payer: Self-pay | Admitting: Internal Medicine

## 2017-05-31 DIAGNOSIS — Z79891 Long term (current) use of opiate analgesic: Secondary | ICD-10-CM | POA: Diagnosis not present

## 2017-05-31 DIAGNOSIS — G894 Chronic pain syndrome: Secondary | ICD-10-CM | POA: Diagnosis not present

## 2017-05-31 DIAGNOSIS — M961 Postlaminectomy syndrome, not elsewhere classified: Secondary | ICD-10-CM | POA: Diagnosis not present

## 2017-05-31 DIAGNOSIS — G4700 Insomnia, unspecified: Secondary | ICD-10-CM | POA: Diagnosis not present

## 2017-05-31 NOTE — Telephone Encounter (Signed)
Pt called back she received a call. Please advise?  Call pt @ [phone]. Thank you!

## 2017-06-01 NOTE — Telephone Encounter (Signed)
Spoke with pt and informed that I think she was responding to an old message where I had been trying to get in touch with her about a medication last week, but that we had already spoken and taken care of it.

## 2017-06-02 NOTE — Telephone Encounter (Signed)
appt has been scheduled for 06/14/2017

## 2017-06-03 DIAGNOSIS — Z23 Encounter for immunization: Secondary | ICD-10-CM | POA: Diagnosis not present

## 2017-06-07 DIAGNOSIS — Z85828 Personal history of other malignant neoplasm of skin: Secondary | ICD-10-CM | POA: Diagnosis not present

## 2017-06-07 DIAGNOSIS — L723 Sebaceous cyst: Secondary | ICD-10-CM | POA: Diagnosis not present

## 2017-06-07 DIAGNOSIS — R600 Localized edema: Secondary | ICD-10-CM | POA: Diagnosis not present

## 2017-06-07 DIAGNOSIS — Q828 Other specified congenital malformations of skin: Secondary | ICD-10-CM | POA: Diagnosis not present

## 2017-06-07 DIAGNOSIS — D1801 Hemangioma of skin and subcutaneous tissue: Secondary | ICD-10-CM | POA: Diagnosis not present

## 2017-06-07 DIAGNOSIS — L821 Other seborrheic keratosis: Secondary | ICD-10-CM | POA: Diagnosis not present

## 2017-06-07 DIAGNOSIS — L814 Other melanin hyperpigmentation: Secondary | ICD-10-CM | POA: Diagnosis not present

## 2017-06-07 DIAGNOSIS — L570 Actinic keratosis: Secondary | ICD-10-CM | POA: Diagnosis not present

## 2017-06-07 DIAGNOSIS — D225 Melanocytic nevi of trunk: Secondary | ICD-10-CM | POA: Diagnosis not present

## 2017-06-14 ENCOUNTER — Ambulatory Visit (INDEPENDENT_AMBULATORY_CARE_PROVIDER_SITE_OTHER): Payer: Medicare Other | Admitting: Internal Medicine

## 2017-06-14 ENCOUNTER — Encounter: Payer: Self-pay | Admitting: Internal Medicine

## 2017-06-14 VITALS — BP 114/60 | HR 66 | Temp 97.7°F | Resp 15 | Ht 67.0 in | Wt 138.8 lb

## 2017-06-14 DIAGNOSIS — J320 Chronic maxillary sinusitis: Secondary | ICD-10-CM

## 2017-06-14 DIAGNOSIS — E890 Postprocedural hypothyroidism: Secondary | ICD-10-CM | POA: Diagnosis not present

## 2017-06-14 DIAGNOSIS — E782 Mixed hyperlipidemia: Secondary | ICD-10-CM

## 2017-06-14 DIAGNOSIS — I6523 Occlusion and stenosis of bilateral carotid arteries: Secondary | ICD-10-CM | POA: Diagnosis not present

## 2017-06-14 DIAGNOSIS — R2689 Other abnormalities of gait and mobility: Secondary | ICD-10-CM | POA: Diagnosis not present

## 2017-06-14 MED ORDER — LEVOFLOXACIN 500 MG PO TABS: 500.0000 mg | ORAL_TABLET | Freq: Every day | ORAL | 0 refills | Status: DC

## 2017-06-14 MED ORDER — PREDNISONE 10 MG PO TABS: ORAL_TABLET | ORAL | 0 refills | Status: DC

## 2017-06-14 NOTE — Progress Notes (Signed)
Subjective:  Patient ID: Sonya Cordova, female    DOB: 05-26-1946  Age: 71 y.o. MRN: [id number]  CC: The primary encounter diagnosis was Mixed hyperlipidemia. Diagnoses of Postoperative hypothyroidism, Chronic sinusitis of both maxillary sinuses, Carotid atherosclerosis, bilateral, and Loss of balance were also pertinent to this visit.  HPI [name] presents for follow up on chronic issues including loss of balance resulting in a fall with  minor head trauma in December.      Saw sports medicine for right shoulder pain,   Saw PT  for muscle weakness .  Aggravated her neck pain so she quit going.  Back pain ; s/p major spine surgery with removal of hardware 1.5 yrs ago .  Still seeing Pain clinic for pain management.  Has been having persistent pain described as burning taut pain between the shoulder blades (entire thoracic spine)  Pain Clinic doctor has attributed it to nerve endings still hurting.  Intolerable by late evening  She is very uncomfortable sitting in a recliner,  Barometric pressure also aggravating her arthritis pain   She avoids using morphine until she is home. For the night.    Has been on stable gabapentin dose 600 mg tid since before the surgery.  Has trouble with non enteric coated pills.    Has not tried massage,  Afraid to.  Did try dry needliing  8 months ago . Not covered by insurance , considering trying that again    Cc today: sinuses frontal and maxillary hurting  Teeth hurting,  Ears hurt sporadically.  Has occasional green nasal discharge . Symptoms have been present for weeks.using nettie Pot at least once daily,  singulair and  Loratidine, and atrovent.   Outpatient Medications Prior to Visit  Medication Sig Dispense Refill  . diazepam (VALIUM) 5 MG tablet TAKE 1 TABLET BY MOUTH AT BEDTIME AS NEEDED FOR ANXIETY 30 tablet 1  . estradiol (ESTRACE) 1 MG tablet Take 1 tablet (1 mg total) by mouth daily. 90 tablet 1  . Eszopiclone (ESZOPICLONE) 3 MG  TABS Take 3 mg by mouth at bedtime. Take immediately before bedtime. Name Brand ONLY    . gabapentin (NEURONTIN) 100 MG capsule Take 300 mg by mouth 3 (three) times daily.    Marland Kitchen ibuprofen (ADVIL,MOTRIN) 600 MG tablet Take 600 mg by mouth as needed.    Marland Kitchen ipratropium (ATROVENT) 0.06 % nasal spray USE TWO SPRAY(S) IN EACH NOSTRIL 4 TIMES DAILY 15 mL 12  . methocarbamol (ROBAXIN) 500 MG tablet Take 1,000 mg by mouth Every 6 hours.     . montelukast (SINGULAIR) 10 MG tablet TAKE ONE TABLET BY MOUTH AT BEDTIME 90 tablet 2  . morphine (MSIR) 30 MG tablet Take 60 mg by mouth every 6 (six) hours as needed (takes 3xdaily). pain    . naloxegol oxalate (MOVANTIK) 25 MG TABS tablet Take 25 mg by mouth daily as needed.    . polyethylene glycol (MIRALAX / GLYCOLAX) packet Take 17 g by mouth as needed. constipation    . potassium chloride (K-DUR,KLOR-CON) 10 MEQ tablet Take 10 mEq by mouth 2 (two) times daily as needed. With torsemide usage    . Red Yeast Rice 600 MG CAPS Take 600 mg by mouth 2 (two) times daily.    Marland Kitchen SYNTHROID 112 MCG tablet TAKE 1 TABLET BY MOUTH ONCE DAILY BEFORE BREAKFAST 90 tablet 0  . tiZANidine (ZANAFLEX) 4 MG tablet Take 1 tablet (4 mg total) by mouth every 6 (six) hours as  needed for muscle spasms. 90 tablet 2  . torsemide (DEMADEX) 5 MG tablet Take 1 tablet (5 mg total) by mouth daily as needed. 90 tablet 0  . predniSONE (DELTASONE) 10 MG tablet 6 tablets on Day 1 , then reduce by 1 tablet daily until gone 21 tablet 0  . Diclofenac Sodium CR 100 MG 24 hr tablet TAKE ONE TABLET BY MOUTH ONCE DAILY (Patient not taking: Reported on 06/14/2017) 90 tablet 2  . estradiol (ESTRACE) 1 MG tablet TAKE ONE TABLET BY MOUTH ONCE DAILY (Patient not taking: Reported on 06/14/2017) 90 tablet 1  . levofloxacin (LEVAQUIN) 500 MG tablet Take 1 tablet (500 mg total) by mouth daily. 7 tablet 0  . meloxicam (MOBIC) 15 MG tablet Take 1 tablet (15 mg total) by mouth daily. (Patient not taking: Reported on  09/14/2016) 90 tablet 1  . Multiple Vitamins-Minerals (MULTIVITAMIN WITH MINERALS) tablet Take 1 tablet by mouth daily.    . nitroGLYCERIN (NITRODUR - DOSED IN MG/24 HR) 0.2 mg/hr patch Use 1/4 patch daily to the affected area (Patient not taking: Reported on 06/14/2017) 8 patch 1  . SYNTHROID 112 MCG tablet TAKE 1 TABLET BY MOUTH ONCE DAILY BEFORE BREAKFAST (Patient not taking: Reported on 06/14/2017) 90 tablet 1  . SYNTHROID 125 MCG tablet TAKE ONE TABLET BY MOUTH ONCE DAILY (Patient not taking: Reported on 06/14/2017) 30 tablet 11  . Tdap (BOOSTRIX) 5-2.5-18.5 LF-MCG/0.5 injection Inject 0.5 mLs into the muscle once. (Patient not taking: Reported on 09/14/2016) 0.5 mL 0   No facility-administered medications prior to visit.     Review of Systems;  Patient denies  fevers,, unintentional weight loss, skin rash, eye pain, , sore throat, dysphagia,  hemoptysis , cough, dyspnea, wheezing, chest pain, palpitations, orthopnea, edema, abdominal pain, nausea, melena, diarrhea, constipation, flank pain, dysuria, hematuria, urinary  Frequency, nocturia, numbness, tingling, seizures,  Focal weakness, Loss of consciousness,  Tremor, insomnia, depression, anxiety, and suicidal ideation.      Objective:  BP 114/60 (BP Location: Left Arm, Patient Position: Sitting, Cuff Size: Normal)   Pulse 66   Temp 97.7 F (36.5 C) (Oral)   Resp 15   Ht 5\' 7"  (1.702 m)   Wt 138 lb 12.8 oz (63 kg)   SpO2 95%   BMI 21.74 kg/m   BP Readings from Last 3 Encounters:  06/14/17 114/60  02/23/17 (!) 112/55  02/10/17 (!) 107/51    Wt Readings from Last 3 Encounters:  06/14/17 138 lb 12.8 oz (63 kg)  02/23/17 132 lb (59.9 kg)  02/10/17 132 lb (59.9 kg)    General appearance: alert, cooperative and appears stated age Ears: normal TM's and external ear canals both ears FAce: bilateral maxillary sinus tenderness  Throat: lips, mucosa, and tongue normal; teeth and gums normal Neck: no adenopathy, no carotid bruit,  supple, symmetrical, trachea midline and thyroid not enlarged, symmetric, no tenderness/mass/nodules Back: symmetric, no curvature. ROM normal. No CVA tenderness. Lungs: clear to auscultation bilaterally Heart: regular rate and rhythm, S1, S2 normal, no murmur, click, rub or gallop Abdomen: soft, non-tender; bowel sounds normal; no masses,  no organomegaly Pulses: 2+ and symmetric Skin: Skin color, texture, turgor normal. No rashes or lesions Lymph nodes: Cervical, supraclavicular, and axillary nodes normal.  No results found for: HGBA1C  Lab Results  Component Value Date   CREATININE 0.95 06/14/2017   CREATININE 0.75 08/31/2016   CREATININE 0.83 10/14/2015    Lab Results  Component Value Date   WBC 5.1 08/31/2016  HGB 12.2 08/31/2016   HCT 36.8 08/31/2016   PLT 156.0 08/31/2016   GLUCOSE 99 06/14/2017   CHOL 189 08/31/2016   TRIG 65.0 08/31/2016   HDL 73.70 08/31/2016   LDLDIRECT 122.0 06/14/2017   LDLCALC 102 (H) 08/31/2016   ALT 14 06/14/2017   AST 21 06/14/2017   NA 140 06/14/2017   K 4.1 06/14/2017   CL 99 06/14/2017   CREATININE 0.95 06/14/2017   BUN 25 (H) 06/14/2017   CO2 33 (H) 06/14/2017   TSH 1.22 06/14/2017   INR 0.85 05/16/2012   MICROALBUR 0.3 11/01/2012    Ct Cervical Spine Wo Contrast  Result Date: 03/22/2017 CLINICAL DATA:  Cervicothoracic spondylosis. Right arm and shoulder pain. Right arm weakness. Neck pain and headache. EXAM: CT CERVICAL SPINE WITHOUT CONTRAST TECHNIQUE: Multidetector CT imaging of the cervical spine was performed without intravenous contrast. Multiplanar CT image reconstructions were also generated. COMPARISON:  MRI dated 01/05/2016 FINDINGS: Alignment: Normal. Skull base and vertebrae: Degenerative arthritic changes between the anterior arch of C1 and the odontoid. Solid anterior fusion from C4 through C7. Solid posterior fusion at C6-7. Upper thoracic fusion from T3 extending distally. Soft tissues and spinal canal: Prevertebral  soft tissues are normal. Surgical clips to the right of the trachea consistent with previous surgery of the thyroid gland. Disc levels: C2-3: Tiny central disc bulge with no neural impingement. Mild left facet arthritis. Widely patent neural foramina. C3-4: Moderate left facet arthritis. Slight left foraminal narrowing. Widely patent right neural foramen. No disc bulging or protrusion. C4-5 through C6-7: Solid anterior fusion. No residual impingement. No foraminal stenoses. C7-T1: Tiny central disc bulge with no neural impingement. Widely patent neural foramina. No facet arthritis. T1-2:  Negative. T2-3:  Negative. Upper chest: Scarring in both lung apices. Other: None. IMPRESSION: 1. No findings to suggest a cause of the patient's right upper arm symptoms. 2. Solid fusion from C3 through C7. 3. Moderate facet arthritis at C3-4 on the left. Electronically Signed   By: Lorriane Shire M.D.   On: 03/22/2017 10:03    Assessment & Plan:   Problem List Items Addressed This Visit    Carotid atherosclerosis, bilateral    Asymptomatic,  Incidental finding on CT head 2017.  She prefers to continue red yeast rice rather than start statin therapy   Lab Results  Component Value Date   CHOL 189 08/31/2016   HDL 73.70 08/31/2016   LDLCALC 102 (H) 08/31/2016   LDLDIRECT 122.0 06/14/2017   TRIG 65.0 08/31/2016   CHOLHDL 3 08/31/2016         Chronic sinusitis of both maxillary sinuses    Treating with  levaquin x 14 days, saline irrigation       Relevant Medications   predniSONE (DELTASONE) 10 MG tablet   Hypothyroidism    Thyroid function is WNL on current dose.  No current changes needed.   Lab Results  Component Value Date   TSH 1.22 06/14/2017         Relevant Orders   TSH (Completed)   Loss of balance    Resulting in a fall Dec 2017.  Did not tolerate PT for balance due to aggravation of back pain        Other Visit Diagnoses    Mixed hyperlipidemia    -  Primary   Relevant Orders    Comprehensive metabolic panel (Completed)   Direct LDL (Completed)      I have discontinued Ms. Hata's multivitamin with minerals, Tdap, meloxicam,  levofloxacin, Diclofenac Sodium CR, nitroGLYCERIN, and levofloxacin. I have also changed her predniSONE. Additionally, I am having her maintain her morphine, gabapentin, polyethylene glycol, potassium chloride, methocarbamol, Eszopiclone, ibuprofen, naloxegol oxalate, tiZANidine, torsemide, estradiol, Red Yeast Rice, montelukast, SYNTHROID, ipratropium, diazepam, Lactobacillus (PROBIOTIC ACIDOPHILUS PO), and Vitamin D3.  Meds ordered this encounter  Medications  . DISCONTD: levofloxacin (LEVAQUIN) 500 MG tablet    Sig: Take 1 tablet (500 mg total) by mouth daily.    Dispense:  14 tablet    Refill:  0  . predniSONE (DELTASONE) 10 MG tablet    Sig: 6 tablets daily for 3 days , then reduce by 1 tablet daily until gone    Dispense:  33 tablet    Refill:  0  . Lactobacillus (PROBIOTIC ACIDOPHILUS PO)    Sig: Take 1 capsule by mouth daily.  . Cholecalciferol (VITAMIN D3) 1000 units CAPS    Sig: Take 1 capsule by mouth daily.   A total of 25 minutes of face to face time was spent with patient more than half of which was spent in counselling about the above mentioned conditions  and coordination of care  Medications Discontinued During This Encounter  Medication Reason  . Diclofenac Sodium CR 100 MG 24 hr tablet Patient has not taken in last 30 days  . estradiol (ESTRACE) 1 MG tablet Duplicate  . levofloxacin (LEVAQUIN) 500 MG tablet Patient has not taken in last 30 days  . meloxicam (MOBIC) 15 MG tablet Patient has not taken in last 30 days  . Multiple Vitamins-Minerals (MULTIVITAMIN WITH MINERALS) tablet Patient has not taken in last 30 days  . nitroGLYCERIN (NITRODUR - DOSED IN MG/24 HR) 0.2 mg/hr patch Patient has not taken in last 30 days  . SYNTHROID 378 MCG tablet Duplicate  . SYNTHROID 125 MCG tablet Patient has not taken in last 30  days  . Tdap (BOOSTRIX) 5-2.5-18.5 LF-MCG/0.5 injection Patient has not taken in last 30 days  . predniSONE (DELTASONE) 10 MG tablet Reorder  . levofloxacin (LEVAQUIN) 500 MG tablet Patient has not taken in last 30 days    Follow-up: No Follow-up on file.   Crecencio Mc, MD

## 2017-06-14 NOTE — Patient Instructions (Signed)
I am treating you for sinusitis  Chronic.    I am prescribing an antibiotic (levaquin) for 14 days  and a prednisone taper  To manage the infection and the inflammation in your ear/sinuses.   I also advise use of the following OTC meds to help with your other symptoms.      Sudafed PE  10 to 30 mg every 8 hours for the congestion, you may substitute Afrin nasal spray for the nighttime dose of sudafed PE  If needed to prevent insomnia.  flush your sinuses twice daily with  [employer]

## 2017-06-15 LAB — COMPREHENSIVE METABOLIC PANEL
ALT: 14 U/L (ref 0–35)
AST: 21 U/L (ref 0–37)
Albumin: 4.1 g/dL (ref 3.5–5.2)
Alkaline Phosphatase: 51 U/L (ref 39–117)
BUN: 25 mg/dL — ABNORMAL HIGH (ref 6–23)
CO2: 33 mEq/L — ABNORMAL HIGH (ref 19–32)
Calcium: 9.7 mg/dL (ref 8.4–10.5)
Chloride: 99 mEq/L (ref 96–112)
Creatinine, Ser: 0.95 mg/dL (ref 0.40–1.20)
GFR: 61.88 mL/min (ref >60.00)
Glucose, Bld: 99 mg/dL (ref 70–99)
Potassium: 4.1 mEq/L (ref 3.5–5.1)
Sodium: 140 mEq/L (ref 135–145)
Total Bilirubin: 0.5 mg/dL (ref 0.2–1.2)
Total Protein: 6.8 g/dL (ref 6.0–8.3)

## 2017-06-15 LAB — LDL CHOLESTEROL, DIRECT: Direct LDL: 122 mg/dL

## 2017-06-15 LAB — TSH: TSH: 1.22 u[IU]/mL (ref 0.35–4.50)

## 2017-06-16 DIAGNOSIS — J320 Chronic maxillary sinusitis: Secondary | ICD-10-CM | POA: Insufficient documentation

## 2017-06-16 NOTE — Assessment & Plan Note (Signed)
Resulting in a fall Dec 2017.  Did not tolerate PT for balance due to aggravation of back pain

## 2017-06-16 NOTE — Assessment & Plan Note (Signed)
Thyroid function is WNL on current dose.  No current changes needed.   Lab Results  Component Value Date   TSH 1.22 06/14/2017

## 2017-06-16 NOTE — Assessment & Plan Note (Signed)
Treating with  levaquin x 14 days, saline irrigation

## 2017-06-16 NOTE — Assessment & Plan Note (Signed)
Asymptomatic,  Incidental finding on CT head 2017.  She prefers to continue red yeast rice rather than start statin therapy   Lab Results  Component Value Date   CHOL 189 08/31/2016   HDL 73.70 08/31/2016   LDLCALC 102 (H) 08/31/2016   LDLDIRECT 122.0 06/14/2017   TRIG 65.0 08/31/2016   CHOLHDL 3 08/31/2016

## 2017-06-23 ENCOUNTER — Telehealth: Payer: Self-pay | Admitting: Internal Medicine

## 2017-06-23 MED ORDER — TORSEMIDE 5 MG PO TABS: 5.0000 mg | ORAL_TABLET | Freq: Every day | ORAL | 0 refills | Status: DC | PRN

## 2017-06-23 NOTE — Telephone Encounter (Signed)
Notified that rx was sent in. Pt gave a verbal understanding.

## 2017-06-23 NOTE — Telephone Encounter (Signed)
Please advise 

## 2017-06-23 NOTE — Telephone Encounter (Signed)
I have refilled the torsemide to use prn

## 2017-06-23 NOTE — Telephone Encounter (Signed)
Pt called and is asking to have Dr. Derrel Nip call in a diruretic. Pt states that she felt rushed in her last visit and did not have a chance to discuss this with Dr. Derrel Nip. Please advise, thank you!  Call pt @ [phone]

## 2017-06-23 NOTE — Telephone Encounter (Signed)
Left pt message asking to call Ebony Hail back directly at [phone] to schedule AWV. Thanks!  *NOTE* Last AWV 09/01/16; please schedule 09/02/17 or after

## 2017-07-04 ENCOUNTER — Telehealth: Payer: Self-pay | Admitting: *Deleted

## 2017-07-04 NOTE — Telephone Encounter (Signed)
Mailed a copy of lab work to pt.

## 2017-07-04 NOTE — Telephone Encounter (Signed)
Pt requested to have her recent labs mailed to her

## 2017-07-12 DIAGNOSIS — M961 Postlaminectomy syndrome, not elsewhere classified: Secondary | ICD-10-CM | POA: Diagnosis not present

## 2017-07-12 DIAGNOSIS — Z79891 Long term (current) use of opiate analgesic: Secondary | ICD-10-CM | POA: Diagnosis not present

## 2017-07-12 DIAGNOSIS — G894 Chronic pain syndrome: Secondary | ICD-10-CM | POA: Diagnosis not present

## 2017-07-12 DIAGNOSIS — G4700 Insomnia, unspecified: Secondary | ICD-10-CM | POA: Diagnosis not present

## 2017-07-13 ENCOUNTER — Telehealth: Payer: Self-pay | Admitting: Internal Medicine

## 2017-07-13 DIAGNOSIS — E890 Postprocedural hypothyroidism: Secondary | ICD-10-CM

## 2017-07-13 NOTE — Telephone Encounter (Signed)
Pt stated that she has chronic sinus infection and nothing is working. Pt wanted to know what the next step would be

## 2017-07-14 ENCOUNTER — Ambulatory Visit: Payer: Medicare Other

## 2017-07-14 MED ORDER — PREDNISONE 10 MG PO TABS: ORAL_TABLET | ORAL | 0 refills | Status: DC

## 2017-07-14 MED ORDER — LEVOFLOXACIN 500 MG PO TABS: 500.0000 mg | ORAL_TABLET | Freq: Every day | ORAL | 0 refills | Status: DC

## 2017-07-14 NOTE — Telephone Encounter (Signed)
Patient notified and voiced understanding to PCP directions.

## 2017-07-14 NOTE — Telephone Encounter (Signed)
Spoke with pt and stated that she was int he office to see you about 3-4 weeks ago and was given levoquin and prednisone for a sinus infection. The pt stated that she has completed the medication and still feels terrible. She stated that she is having severe facial pain behind the bridge of her nose, eyes, and jaws. She also stated that she is taking allergy medicine and has switched to different ones to see if one would help better than the other but nothing has helped. Pt is wanting to know what she needs to do now.

## 2017-07-14 NOTE — Telephone Encounter (Signed)
14 day rx with levaquin and 10 day prednisone taper sent to pharmacy.  She is allergic to PCN and clindamycin so there are no other good choices.  Start doing a  saline irrigation twice daily ([employer] or [employer]) .  Continue probiotic

## 2017-07-14 NOTE — Telephone Encounter (Signed)
Spoke with pt and she stated that she did get relieve while taking the medications and would like to try a second round of the antibiotics and the prednisone.

## 2017-07-14 NOTE — Telephone Encounter (Signed)
If she felt no better while on the medication,  She needs to see ENT.  If she felt better,   But symptoms returned , we can try a second course of antibiotics and prednisone

## 2017-07-14 NOTE — Telephone Encounter (Signed)
LMTCB

## 2017-07-14 NOTE — Telephone Encounter (Signed)
Please call pt at [phone]

## 2017-07-18 DIAGNOSIS — Z800 Family history of malignant neoplasm of digestive organs: Secondary | ICD-10-CM | POA: Diagnosis not present

## 2017-07-18 DIAGNOSIS — Z85820 Personal history of malignant melanoma of skin: Secondary | ICD-10-CM | POA: Diagnosis not present

## 2017-07-18 DIAGNOSIS — Z808 Family history of malignant neoplasm of other organs or systems: Secondary | ICD-10-CM | POA: Diagnosis not present

## 2017-07-20 ENCOUNTER — Ambulatory Visit (INDEPENDENT_AMBULATORY_CARE_PROVIDER_SITE_OTHER): Payer: Medicare Other

## 2017-07-20 VITALS — BP 110/68 | HR 72 | Temp 98.5°F | Resp 14

## 2017-07-20 DIAGNOSIS — Z0000 Encounter for general adult medical examination without abnormal findings: Secondary | ICD-10-CM

## 2017-07-20 NOTE — Patient Instructions (Addendum)
  Sonya Cordova , Thank you for taking time to come for your Medicare Wellness Visit. I appreciate your ongoing commitment to your health goals. Please review the following plan we discussed and let me know if I can assist you in the future.   Follow up with Dr. Derrel Nip as needed.    Have a great day!  These are the goals we discussed: Goals    . Increase water intake          Stay hydrated       This is a list of the screening recommended for you and due dates:  Health Maintenance  Topic Date Due  . Mammogram  10/13/2018  . Colon Cancer Screening  11/01/2020  . Tetanus Vaccine  09/07/2026  . Flu Shot  Completed  . DEXA scan (bone density measurement)  Completed  .  Hepatitis C: One time screening is recommended by Center for Disease Control  (CDC) for  adults born from [birth year] through 1963.   Completed  . Pneumonia vaccines  Completed

## 2017-07-20 NOTE — Progress Notes (Addendum)
Subjective:   Sonya Cordova is a 71 y.o. female who presents for Medicare Annual (Subsequent) preventive examination.  Review of Systems:  No ROS.  Medicare Wellness Visit. Additional risk factors are reflected in the social history.  Cardiac Risk Factors include: advanced age (>66men, >49 women)     Objective:     Vitals: BP 110/68 (BP Location: Right Arm, Patient Position: Sitting, Cuff Size: Normal)   Pulse 72   Temp 98.5 F (36.9 C) (Oral)   Resp 14   SpO2 96%   There is no height or weight on file to calculate BMI.   Tobacco History  Smoking Status  . Former Smoker  . Packs/day: 1.00  . Years: 20.00  . Types: Cigarettes  . Quit date: 10/05/1987  Smokeless Tobacco  . Never Used     Counseling given: Not Answered   Past Medical History:  Diagnosis Date  . Anemia   . Arthritis   . Cancer (HCC)    Basal cell  . Complication of anesthesia    "states remembers surgeon talking- was light"  . Connective tissue disease ([place])   . Contact lens/glasses fitting    wears contacts or glasses  . Degenerative disc disease 1995  . Edema of both legs   . Family history of anesthesia complication   . Headache(784.0)    migraines  . History of blood transfusion   . Hypothyroidism   . Lymphedema   . Neuropathy   . Osteopenia 02/2012   t score -1.1  . PONV (postoperative nausea and vomiting)    Past Surgical History:  Procedure Laterality Date  . abdominal plasty    . APPENDECTOMY    . BACK SURGERY     neck,, cervical, fusion 1995-2010 ( 17 surgeries)  . BREAST ENHANCEMENT SURGERY  1978  . EYE SURGERY     as a child  . HAMMER TOE SURGERY    . hemorrhoid    . KNEE ARTHROSCOPY    . knee replace    . left elbow    . left wrist    . SPINAL CORD STIMULATOR IMPLANT  2011  . THYROIDECTOMY  1986  . TONSILLECTOMY AND ADENOIDECTOMY    . TOTAL KNEE ARTHROPLASTY     2 times right  . TOTAL KNEE ARTHROPLASTY  05/22/2012   Procedure: TOTAL KNEE ARTHROPLASTY;   Surgeon: Gearlean Alf, MD;  Location: [facility];  Service: Orthopedics;  Laterality: Left;  . TOTAL SHOULDER REPLACEMENT     2 times right shoulder  . TUBAL LIGATION    . ULNAR NERVE TRANSPOSITION Left 06/13/2013   Procedure: DECOMPRESSION, POSSIBLE TRANSPOSITION LEFT ULNAR NERVE;  Surgeon: Wynonia Sours, MD;  Location: [facility];  Service: Orthopedics;  Laterality: Left;  Marland Kitchen VAGINAL HYSTERECTOMY  1986   Family History  Problem Relation Age of Onset  . Hypertension Sister   . Stroke Sister   . Heart disease Sister   . Thyroid disease Sister   . Hypertension Mother   . Heart disease Mother   . Pancreatic cancer Mother   . Skin cancer Mother   . Rheum arthritis Mother   . Hyperlipidemia Mother   . Hypertension Father   . Heart disease Father   . Skin cancer Father   . Osteoarthritis Father   . Hypertension Brother   . Osteoarthritis Brother   . Skin cancer Sister   . Osteoarthritis Sister   . Hypertension Sister    History  Sexual Activity  .  Sexual activity: Yes  . Partners: Male  . Birth control/ protection: Post-menopausal    Outpatient Encounter Prescriptions as of 07/20/2017  Medication Sig  . Cholecalciferol (VITAMIN D3) 1000 units CAPS Take 1 capsule by mouth daily.  . diazepam (VALIUM) 5 MG tablet TAKE 1 TABLET BY MOUTH AT BEDTIME AS NEEDED FOR ANXIETY  . estradiol (ESTRACE) 1 MG tablet Take 1 tablet (1 mg total) by mouth daily.  . Eszopiclone (ESZOPICLONE) 3 MG TABS Take 3 mg by mouth at bedtime. Take immediately before bedtime. Name Brand ONLY  . gabapentin (NEURONTIN) 100 MG capsule Take 300 mg by mouth 3 (three) times daily.  Marland Kitchen ibuprofen (ADVIL,MOTRIN) 600 MG tablet Take 600 mg by mouth as needed.  Marland Kitchen ipratropium (ATROVENT) 0.06 % nasal spray USE TWO SPRAY(S) IN EACH NOSTRIL 4 TIMES DAILY  . Lactobacillus (PROBIOTIC ACIDOPHILUS PO) Take 1 capsule by mouth daily.  Marland Kitchen levofloxacin (LEVAQUIN) 500 MG tablet Take 1 tablet (500 mg total) by mouth daily.    . methocarbamol (ROBAXIN) 500 MG tablet Take 1,000 mg by mouth Every 6 hours.   . montelukast (SINGULAIR) 10 MG tablet TAKE ONE TABLET BY MOUTH AT BEDTIME  . morphine (MSIR) 30 MG tablet Take 60 mg by mouth every 6 (six) hours as needed (takes 3xdaily). pain  . naloxegol oxalate (MOVANTIK) 25 MG TABS tablet Take 25 mg by mouth daily as needed.  . polyethylene glycol (MIRALAX / GLYCOLAX) packet Take 17 g by mouth as needed. constipation  . predniSONE (DELTASONE) 10 MG tablet 6 tablets daily for 3 days , then reduce by 1 tablet daily until gone  . Red Yeast Rice 600 MG CAPS Take 600 mg by mouth 2 (two) times daily.  Marland Kitchen SYNTHROID 112 MCG tablet TAKE 1 TABLET BY MOUTH ONCE DAILY BEFORE BREAKFAST  . tiZANidine (ZANAFLEX) 4 MG tablet Take 1 tablet (4 mg total) by mouth every 6 (six) hours as needed for muscle spasms.  Marland Kitchen torsemide (DEMADEX) 5 MG tablet Take 1 tablet (5 mg total) by mouth daily as needed.  . [DISCONTINUED] potassium chloride (K-DUR,KLOR-CON) 10 MEQ tablet Take 10 mEq by mouth 2 (two) times daily as needed. With torsemide usage   No facility-administered encounter medications on file as of 07/20/2017.     Activities of Daily Living In your present state of health, do you have any difficulty performing the following activities: 07/20/2017  Hearing? N  Vision? N  Difficulty concentrating or making decisions? N  Walking or climbing stairs? Y  Dressing or bathing? N  Doing errands, shopping? N  Preparing Food and eating ? N  Using the Toilet? N  In the past six months, have you accidently leaked urine? Y  Comment Managed with a daily brief/pad  Do you have problems with loss of bowel control? N  Managing your Medications? N  Managing your Finances? N  Housekeeping or managing your Housekeeping? N  Some recent data might be hidden    Patient Care Team: Crecencio Mc, MD as PCP - General (Internal Medicine)    Assessment:    This is a routine wellness examination for  [facility]. The goal of the wellness visit is to assist the patient how to close the gaps in care and create a preventative care plan for the patient.   The roster of all physicians providing medical care to patient is listed in the Snapshot section of the chart.  Taking calcium VIT D as appropriate/Osteoporosis risk reviewed.    Safety issues reviewed; Smoke  and carbon monoxide detectors in the home. No firearms in the home.  Wears seatbelts when driving or riding with others. Patient does wear sunscreen or protective clothing when in direct sunlight. No violence in the home.  Patient is alert, normal appearance, oriented to person/place/and time.    Correctly identified the president of the [place], recall of 3/3 words, and performing simple calculations. Displays appropriate judgement and can read correct time from watch face.   No new identified risk were noted.  No failures at ADL's or IADL's.   BMI- discussed the importance of a healthy diet, water intake and the benefits of aerobic exercise. Educational material provided.   24 hour diet recall: Low carb foods  Daily fluid intake: 0 cups of caffeine, 4 cups of water  Dental- every 6 months.  Eye- Visual acuity not assessed per patient preference since they have regular follow up with the ophthalmologist.    Sleep patterns- Sleeps 6 hours at night.   Health maintenance gaps- closed.  Patient Concerns: None at this time. Follow up with PCP as needed.  Exercise Activities and Dietary recommendations Current Exercise Habits: The patient does not participate in regular exercise at present, Exercise limited by: orthopedic condition(s)  Goals    . Increase water intake          Stay hydrated      Fall Risk Fall Risk  07/20/2017 06/14/2017 05/28/2016 01/16/2015  Falls in the past year? No No No Yes  Comment - - Emmi Telephone Survey: data to providers prior to load -  Number falls in past yr: - - - (No Data)  Comment - - -  tripped over dog  Injury with Fall? - - - Yes  Comment - - - bruised shoulder  Risk Factor Category  - - - High Fall Risk  Comment - - - neuropathy  Risk for fall due to : - - - Other (Comment)   Depression Screen PHQ 2/9 Scores 07/20/2017 01/16/2015  PHQ - 2 Score 0 1  PHQ- 9 Score 0 -     Cognitive Function MMSE - Mini Mental State Exam 07/20/2017  Orientation to time 5  Orientation to Place 5  Registration 3  Attention/ Calculation 5  Recall 3  Language- name 2 objects 2  Language- repeat 1  Language- follow 3 step command 3  Language- read & follow direction 1  Write a sentence 1  Copy design 1  Total score 30        Immunization History  Administered Date(s) Administered  . Influenza Split 06/04/2014, 06/05/2015, 06/29/2016  . Influenza, High Dose Seasonal PF 06/08/2017  . Influenza,inj,Quad PF,6+ Mos 06/21/2013  . Influenza-Unspecified 06/04/2012  . Pneumococcal Conjugate-13 08/10/2013  . Pneumococcal Polysaccharide-23 04/01/2015  . Tdap 09/07/2016  . Zoster 08/21/2013   Screening Tests Health Maintenance  Topic Date Due  . MAMMOGRAM  10/13/2018  . COLONOSCOPY  11/01/2020  . TETANUS/TDAP  09/07/2026  . INFLUENZA VACCINE  Completed  . DEXA SCAN  Completed  . Hepatitis C Screening  Completed  . PNA vac Low Risk Adult  Completed      Plan:    End of life planning; Advance aging; Advanced directives discussed. Copy of current HCPOA/Living Will on file.    I have personally reviewed and noted the following in the patient's chart:   . Medical and social history . Use of alcohol, tobacco or illicit drugs  . Current medications and supplements . Functional ability and status . Nutritional  status . Physical activity . Advanced directives . List of other physicians . Hospitalizations, surgeries, and ER visits in previous 12 months . Vitals . Screenings to include cognitive, depression, and falls . Referrals and appointments  In addition, I have  reviewed and discussed with patient certain preventive protocols, quality metrics, and best practice recommendations. A written personalized care plan for preventive services as well as general preventive health recommendations were provided to patient.     OBrien-Blaney, Jaquez Farrington L, LPN  59/06/3111    I have reviewed the above information and agree with above.   Deborra Medina, MD

## 2017-08-01 ENCOUNTER — Other Ambulatory Visit: Payer: Self-pay | Admitting: Internal Medicine

## 2017-08-01 NOTE — Telephone Encounter (Signed)
Completed 07/20/17

## 2017-08-10 ENCOUNTER — Ambulatory Visit (INDEPENDENT_AMBULATORY_CARE_PROVIDER_SITE_OTHER): Payer: Medicare Other | Admitting: Internal Medicine

## 2017-08-10 ENCOUNTER — Encounter: Payer: Self-pay | Admitting: Internal Medicine

## 2017-08-10 VITALS — BP 100/64 | HR 76 | Temp 98.0°F | Resp 14 | Ht 67.0 in | Wt 138.6 lb

## 2017-08-10 DIAGNOSIS — I6523 Occlusion and stenosis of bilateral carotid arteries: Secondary | ICD-10-CM

## 2017-08-10 DIAGNOSIS — R499 Unspecified voice and resonance disorder: Secondary | ICD-10-CM | POA: Diagnosis not present

## 2017-08-10 DIAGNOSIS — E890 Postprocedural hypothyroidism: Secondary | ICD-10-CM

## 2017-08-10 DIAGNOSIS — M8949 Other hypertrophic osteoarthropathy, multiple sites: Secondary | ICD-10-CM

## 2017-08-10 DIAGNOSIS — R32 Unspecified urinary incontinence: Secondary | ICD-10-CM | POA: Diagnosis not present

## 2017-08-10 DIAGNOSIS — M159 Polyosteoarthritis, unspecified: Secondary | ICD-10-CM

## 2017-08-10 DIAGNOSIS — N3642 Intrinsic sphincter deficiency (ISD): Secondary | ICD-10-CM

## 2017-08-10 DIAGNOSIS — M150 Primary generalized (osteo)arthritis: Secondary | ICD-10-CM | POA: Diagnosis not present

## 2017-08-10 DIAGNOSIS — N3941 Urge incontinence: Secondary | ICD-10-CM

## 2017-08-10 LAB — URINALYSIS, MICROSCOPIC ONLY: RBC / HPF: NONE SEEN (ref 0–?)

## 2017-08-10 LAB — POCT URINALYSIS DIPSTICK
Bilirubin, UA: NEGATIVE
Blood, UA: NEGATIVE
Glucose, UA: NEGATIVE
Leukocytes, UA: NEGATIVE
Nitrite, UA: NEGATIVE
Spec Grav, UA: 1.02 (ref 1.010–1.025)
Urobilinogen, UA: 0.2 E.U./dL
pH, UA: 7 (ref 5.0–8.0)

## 2017-08-10 MED ORDER — MIRABEGRON ER 25 MG PO TB24: 25.0000 mg | ORAL_TABLET | Freq: Every day | ORAL | 5 refills | Status: DC

## 2017-08-10 NOTE — Telephone Encounter (Signed)
Is it okay for the pt to have her TSH rechecked again before the end of the year?

## 2017-08-10 NOTE — Patient Instructions (Signed)
For the urinary incontinence  Look up:   Myrbetriq ,  Sanctura, or Toviaz (new bladder agents )  or Vesicare and Detrol LA (older ones)    Also look up Lyrica for pain management (instead of gabapentin)   ENT referral for the hoarseness  [place])

## 2017-08-10 NOTE — Progress Notes (Signed)
Subjective:  Patient ID: Sonya Cordova, female    DOB: 1945-10-07  Age: 71 y.o. MRN: [id number]  CC: The primary encounter diagnosis was Urge incontinence. Diagnoses of Urinary incontinence due to urethral sphincter incompetence, Postoperative hypothyroidism, Primary osteoarthritis involving multiple joints, Carotid atherosclerosis, bilateral, and Hoarseness or changing voice were also pertinent to this visit.  HPI Sonya Cordova presents for follow up on chronic issues.  1) She has been having recurrent urge incontinence .  History of vaginal delivery of 11 lb 2 ounce baby remotely.  2) Hoarseness.  She notes that her voice has become hoarse.  Worse by end of day, often has no voice.  for the past year , and has a history of at least 30 intubations over her lifetime .  Discussed ENT referral   3) Persistent severe Neuropathic pain involving the mid back for 3 years since her surgery. Marland KitchenHas had no relief with use of gabapentin, alpha lipoic acid and B complex   4) Hyperlipidemia managed with [facility] since Sept with improved panel   Reviewed with patient today   5) Malaise and fatigue:  Reviewed labs,  Thyroid function was normal in September.  Wants to repeat it again before end of year.   6) Carotid stenosis ; noted in 2017,  Needs ultrasound annually    Lab Results  Component Value Date   TSH 1.22 06/14/2017     Outpatient Medications Prior to Visit  Medication Sig Dispense Refill  . Cholecalciferol (VITAMIN D3) 1000 units CAPS Take 1 capsule by mouth daily.    . diazepam (VALIUM) 5 MG tablet TAKE 1 TABLET BY MOUTH AT BEDTIME AS NEEDED FOR ANXIETY 30 tablet 1  . estradiol (ESTRACE) 1 MG tablet Take 1 tablet (1 mg total) by mouth daily. 90 tablet 1  . Eszopiclone (ESZOPICLONE) 3 MG TABS Take 3 mg by mouth at bedtime. Take immediately before bedtime. Name Brand ONLY    . gabapentin (NEURONTIN) 100 MG capsule Take 300 mg by mouth 3 (three) times daily.    Marland Kitchen ibuprofen  (ADVIL,MOTRIN) 600 MG tablet Take 600 mg by mouth as needed.    Marland Kitchen ipratropium (ATROVENT) 0.06 % nasal spray USE TWO SPRAY(S) IN EACH NOSTRIL 4 TIMES DAILY 15 mL 12  . Lactobacillus (PROBIOTIC ACIDOPHILUS PO) Take 1 capsule by mouth daily.    . methocarbamol (ROBAXIN) 500 MG tablet Take 1,000 mg by mouth Every 6 hours.     . montelukast (SINGULAIR) 10 MG tablet TAKE ONE TABLET BY MOUTH AT BEDTIME 90 tablet 2  . morphine (MSIR) 30 MG tablet Take 60 mg by mouth every 6 (six) hours as needed (takes 3xdaily). pain    . naloxegol oxalate (MOVANTIK) 25 MG TABS tablet Take 25 mg by mouth daily as needed.    . polyethylene glycol (MIRALAX / GLYCOLAX) packet Take 17 g by mouth as needed. constipation    . Red Yeast Rice 600 MG CAPS Take 600 mg by mouth 2 (two) times daily.    Marland Kitchen SYNTHROID 112 MCG tablet TAKE 1 TABLET BY MOUTH ONCE DAILY BEFORE BREAKFAST 90 tablet 0  . tiZANidine (ZANAFLEX) 4 MG tablet Take 1 tablet (4 mg total) by mouth every 6 (six) hours as needed for muscle spasms. 90 tablet 2  . torsemide (DEMADEX) 5 MG tablet Take 1 tablet (5 mg total) by mouth daily as needed. 90 tablet 0  . levofloxacin (LEVAQUIN) 500 MG tablet Take 1 tablet (500 mg total) by mouth daily. (Patient not  taking: Reported on 08/10/2017) 14 tablet 0  . predniSONE (DELTASONE) 10 MG tablet 6 tablets daily for 3 days , then reduce by 1 tablet daily until gone (Patient not taking: Reported on 08/10/2017) 33 tablet 0   No facility-administered medications prior to visit.     Review of Systems;  Patient denies headache, fevers, malaise, unintentional weight loss, skin rash, eye pain, sinus congestion and sinus pain, sore throat, dysphagia,  hemoptysis , cough, dyspnea, wheezing, chest pain, palpitations, orthopnea, edema, abdominal pain, nausea, melena, diarrhea, constipation, flank pain, dysuria, hematuria, urinary  Frequency, nocturia, numbness, tingling, seizures,  Focal weakness, Loss of consciousness,  Tremor, insomnia,  depression, anxiety, and suicidal ideation.      Objective:  BP 100/64 (BP Location: Left Arm, Patient Position: Sitting, Cuff Size: Normal)   Pulse 76   Temp 98 F (36.7 C) (Oral)   Resp 14   Ht 5\' 7"  (1.702 m)   Wt 138 lb 9.6 oz (62.9 kg)   SpO2 97%   BMI 21.71 kg/m   BP Readings from Last 3 Encounters:  08/10/17 100/64  07/20/17 110/68  06/14/17 114/60    Wt Readings from Last 3 Encounters:  08/10/17 138 lb 9.6 oz (62.9 kg)  06/14/17 138 lb 12.8 oz (63 kg)  02/23/17 132 lb (59.9 kg)    General appearance: alert, cooperative and appears stated age Ears: normal TM's and external ear canals both ears Throat: lips, mucosa, and tongue normal; teeth and gums normal Neck: no adenopathy, no carotid bruit, supple, symmetrical, trachea midline and thyroid not enlarged, symmetric, no tenderness/mass/nodules Back: symmetric, no curvature. ROM normal. No CVA tenderness. Lungs: clear to auscultation bilaterally Heart: regular rate and rhythm, S1, S2 normal, no murmur, click, rub or gallop Abdomen: soft, non-tender; bowel sounds normal; no masses,  no organomegaly Pulses: 2+ and symmetric Skin: Skin color, texture, turgor normal. No rashes or lesions Lymph nodes: Cervical, supraclavicular, and axillary nodes normal.  No results found for: HGBA1C  Lab Results  Component Value Date   CREATININE 0.95 06/14/2017   CREATININE 0.75 08/31/2016   CREATININE 0.83 10/14/2015    Lab Results  Component Value Date   WBC 5.1 08/31/2016   HGB 12.2 08/31/2016   HCT 36.8 08/31/2016   PLT 156.0 08/31/2016   GLUCOSE 99 06/14/2017   CHOL 189 08/31/2016   TRIG 65.0 08/31/2016   HDL 73.70 08/31/2016   LDLDIRECT 122.0 06/14/2017   LDLCALC 102 (H) 08/31/2016   ALT 14 06/14/2017   AST 21 06/14/2017   NA 140 06/14/2017   K 4.1 06/14/2017   CL 99 06/14/2017   CREATININE 0.95 06/14/2017   BUN 25 (H) 06/14/2017   CO2 33 (H) 06/14/2017   TSH 1.22 06/14/2017   INR 0.85 05/16/2012    MICROALBUR 0.3 11/01/2012    Ct Cervical Spine Wo Contrast  Result Date: 03/22/2017 CLINICAL DATA:  Cervicothoracic spondylosis. Right arm and shoulder pain. Right arm weakness. Neck pain and headache. EXAM: CT CERVICAL SPINE WITHOUT CONTRAST TECHNIQUE: Multidetector CT imaging of the cervical spine was performed without intravenous contrast. Multiplanar CT image reconstructions were also generated. COMPARISON:  MRI dated 01/05/2016 FINDINGS: Alignment: Normal. Skull base and vertebrae: Degenerative arthritic changes between the anterior arch of C1 and the odontoid. Solid anterior fusion from C4 through C7. Solid posterior fusion at C6-7. Upper thoracic fusion from T3 extending distally. Soft tissues and spinal canal: Prevertebral soft tissues are normal. Surgical clips to the right of the trachea consistent with previous surgery of  the thyroid gland. Disc levels: C2-3: Tiny central disc bulge with no neural impingement. Mild left facet arthritis. Widely patent neural foramina. C3-4: Moderate left facet arthritis. Slight left foraminal narrowing. Widely patent right neural foramen. No disc bulging or protrusion. C4-5 through C6-7: Solid anterior fusion. No residual impingement. No foraminal stenoses. C7-T1: Tiny central disc bulge with no neural impingement. Widely patent neural foramina. No facet arthritis. T1-2:  Negative. T2-3:  Negative. Upper chest: Scarring in both lung apices. Other: None. IMPRESSION: 1. No findings to suggest a cause of the patient's right upper arm symptoms. 2. Solid fusion from C3 through C7. 3. Moderate facet arthritis at C3-4 on the left. Electronically Signed   By: Lorriane Shire M.D.   On: 03/22/2017 10:03    Assessment & Plan:   Problem List Items Addressed This Visit    Carotid atherosclerosis, bilateral    If her carotid stenosis has progressed will recommend statin trial vs Repatha if not tolerated.       Relevant Orders   VAS US CAROTID   Hoarseness or changing  voice    Patient has noted recent change in voice occurring , more pronounced by end of day.  She is a lifelong nonsmoker,  Denies GERD symptoms.  Suspect vocal cord nodes from history of over 30 inutbations  Referral to ENT for evaluation .       Relevant Orders   Ambulatory referral to ENT   Hypothyroidism    Thyroid function is WNL on current dose.  No current changes needed.   Lab Results  Component Value Date   TSH 1.22 06/14/2017         Osteoarthritis (arthritis due to wear and tear of joints)    Multiple rheumatologic evaluations including most recently at [facility] in 2016 failed to yield the patient's suspected diagnosis of connective tissue disease.  She has had multiple surgeries  And joint replacements and neuropathic pain as well contributing to her misery.  Continue Pain management       Urinary incontinence due to urethral sphincter incompetence    Chronic, first reported  in 2016,  With trial of estrace cream which did not help and was too costly to continue.   Trial of myrbetriq after UTI is ruled out        Relevant Medications   mirabegron ER (MYRBETRIQ) 25 MG TB24 tablet    Other Visit Diagnoses    Urge incontinence    -  Primary   Relevant Medications   mirabegron ER (MYRBETRIQ) 25 MG TB24 tablet   Other Relevant Orders   POCT urinalysis dipstick (Completed)   Urine Microscopic Only (Completed)   Urine Culture (Completed)    A total of 40 minutes was spent with patient more than half of which was spent in counseling patient on the above mentioned issues , reviewing and explaining recent labs and imaging studies done, and coordination of care.   I have discontinued Dion Saucier. Opiela's levofloxacin and predniSONE. I am also having her start on mirabegron ER. Additionally, I am having her maintain her morphine, gabapentin, polyethylene glycol, methocarbamol, Eszopiclone, ibuprofen, naloxegol oxalate, tiZANidine, estradiol, Red Yeast Rice, SYNTHROID,  ipratropium, diazepam, Lactobacillus (PROBIOTIC ACIDOPHILUS PO), Vitamin D3, torsemide, and montelukast.  Meds ordered this encounter  Medications  . mirabegron ER (MYRBETRIQ) 25 MG TB24 tablet    Sig: Take 1 tablet (25 mg total) daily by mouth.    Dispense:  30 tablet    Refill:  5  Medications Discontinued During This Encounter  Medication Reason  . levofloxacin (LEVAQUIN) 500 MG tablet Completed Course  . predniSONE (DELTASONE) 10 MG tablet Completed Course    Follow-up: No Follow-up on file.   Crecencio Mc, MD

## 2017-08-10 NOTE — Telephone Encounter (Signed)
Lab Results  Component Value Date   TSH 1.22 06/14/2017   Yes,  Ordered.

## 2017-08-10 NOTE — Telephone Encounter (Signed)
When patient was checking out from her appt she asked if she could come back by the end of the year to get her thyroid rechecked. She said that she still doesn't feel "well".  I told her that we needed the order prior to scheduling. Pt cb [phone]  I will call her to schedule once order is entered (if Dr. Derrel Nip approves)

## 2017-08-11 LAB — URINE CULTURE
MICRO NUMBER:: 81252801
Result:: NO GROWTH
SPECIMEN QUALITY:: ADEQUATE

## 2017-08-12 NOTE — Telephone Encounter (Signed)
Spoke with pt to schedule her a lab appt. Pt stated that she would call back and schedule the appt because she is fixing to go out of town to visit her elderly sister.

## 2017-08-13 DIAGNOSIS — R499 Unspecified voice and resonance disorder: Secondary | ICD-10-CM | POA: Insufficient documentation

## 2017-08-13 NOTE — Assessment & Plan Note (Signed)
If her carotid stenosis has progressed will recommend statin trial vs Repatha if not tolerated.

## 2017-08-13 NOTE — Assessment & Plan Note (Signed)
Multiple rheumatologic evaluations including most recently at [facility] in 2016 failed to yield the patient's suspected diagnosis of connective tissue disease.  She has had multiple surgeries  And joint replacements and neuropathic pain as well contributing to her misery.  Continue Pain management

## 2017-08-13 NOTE — Assessment & Plan Note (Addendum)
Patient has noted recent change in voice occurring , more pronounced by end of day.  She is a lifelong nonsmoker,  Denies GERD symptoms.  Suspect vocal cord nodes from history of over 30 inutbations  Referral to ENT for evaluation .

## 2017-08-13 NOTE — Assessment & Plan Note (Addendum)
Chronic, first reported  in 2016,  With trial of estrace cream which did not help and was too costly to continue.   Trial of myrbetriq after UTI is ruled out

## 2017-08-13 NOTE — Assessment & Plan Note (Signed)
Thyroid function is WNL on current dose.  No current changes needed.   Lab Results  Component Value Date   TSH 1.22 06/14/2017

## 2017-08-16 ENCOUNTER — Telehealth: Payer: Self-pay

## 2017-08-16 NOTE — Telephone Encounter (Signed)
Can you help me with this?

## 2017-08-16 NOTE — Telephone Encounter (Signed)
Sonya Cordova,  Please see message below from [facility] staff. It does appear the patient was scheduled 6 weeks to early.  Please advise

## 2017-08-16 NOTE — Telephone Encounter (Signed)
Please call patient.   Copied from [place] [id number]. Topic: Appointment Scheduling - Scheduling Inquiry for Clinic >> Aug 16, 2017  3:21 PM Boyd Kerbs wrote:  Reason for CRM: patient came to have medicare well visit and office scheduled. Medicare refuses to pay due to it was not a year and a day since last one. Patient did not know when her last one was and she thought that the person scheduling would have looked and made sure of this. She is very upset and says she will not pay.  Wants to speak to some one regarding this.  Please call, may leave a message if needed.

## 2017-08-17 NOTE — Telephone Encounter (Signed)
Thank you :)

## 2017-08-17 NOTE — Telephone Encounter (Signed)
Hi Vanessa,   The only thing we can do is send to charge correction and have adjusted off.  Does not support any other visit.  I will CC you on email.  Thanks,  [employer]

## 2017-08-29 DIAGNOSIS — G4700 Insomnia, unspecified: Secondary | ICD-10-CM | POA: Diagnosis not present

## 2017-08-29 DIAGNOSIS — M961 Postlaminectomy syndrome, not elsewhere classified: Secondary | ICD-10-CM | POA: Diagnosis not present

## 2017-08-29 DIAGNOSIS — Z79891 Long term (current) use of opiate analgesic: Secondary | ICD-10-CM | POA: Diagnosis not present

## 2017-08-29 DIAGNOSIS — G894 Chronic pain syndrome: Secondary | ICD-10-CM | POA: Diagnosis not present

## 2017-09-05 DIAGNOSIS — J301 Allergic rhinitis due to pollen: Secondary | ICD-10-CM | POA: Diagnosis not present

## 2017-09-05 DIAGNOSIS — R490 Dysphonia: Secondary | ICD-10-CM | POA: Diagnosis not present

## 2017-09-05 DIAGNOSIS — J340 Abscess, furuncle and carbuncle of nose: Secondary | ICD-10-CM | POA: Diagnosis not present

## 2017-10-03 ENCOUNTER — Other Ambulatory Visit: Payer: Medicare Other

## 2017-10-05 ENCOUNTER — Encounter: Payer: Self-pay | Admitting: Internal Medicine

## 2017-10-05 DIAGNOSIS — M26609 Unspecified temporomandibular joint disorder, unspecified side: Secondary | ICD-10-CM

## 2017-10-06 MED ORDER — TIZANIDINE HCL 4 MG PO TABS: 4.0000 mg | ORAL_TABLET | Freq: Four times a day (QID) | ORAL | 1 refills | Status: AC | PRN

## 2017-10-10 DIAGNOSIS — M13822 Other specified arthritis, left elbow: Secondary | ICD-10-CM | POA: Diagnosis not present

## 2017-10-10 DIAGNOSIS — M25522 Pain in left elbow: Secondary | ICD-10-CM | POA: Diagnosis not present

## 2017-10-10 DIAGNOSIS — M25532 Pain in left wrist: Secondary | ICD-10-CM | POA: Diagnosis not present

## 2017-10-11 DIAGNOSIS — Z79891 Long term (current) use of opiate analgesic: Secondary | ICD-10-CM | POA: Diagnosis not present

## 2017-10-11 DIAGNOSIS — G4700 Insomnia, unspecified: Secondary | ICD-10-CM | POA: Diagnosis not present

## 2017-10-11 DIAGNOSIS — M961 Postlaminectomy syndrome, not elsewhere classified: Secondary | ICD-10-CM | POA: Diagnosis not present

## 2017-10-11 DIAGNOSIS — G894 Chronic pain syndrome: Secondary | ICD-10-CM | POA: Diagnosis not present

## 2017-10-12 ENCOUNTER — Encounter: Payer: Self-pay | Admitting: Internal Medicine

## 2017-10-12 ENCOUNTER — Other Ambulatory Visit (INDEPENDENT_AMBULATORY_CARE_PROVIDER_SITE_OTHER): Payer: Medicare Other

## 2017-10-12 DIAGNOSIS — E890 Postprocedural hypothyroidism: Secondary | ICD-10-CM

## 2017-10-12 LAB — T4, FREE: Free T4: 0.94 ng/dL (ref 0.60–1.60)

## 2017-10-12 LAB — TSH: TSH: 25.43 u[IU]/mL — ABNORMAL HIGH (ref 0.35–4.50)

## 2017-10-17 ENCOUNTER — Telehealth: Payer: Self-pay | Admitting: Internal Medicine

## 2017-10-17 DIAGNOSIS — E890 Postprocedural hypothyroidism: Secondary | ICD-10-CM

## 2017-10-17 MED ORDER — LEVOTHYROXINE SODIUM 125 MCG PO TABS: 125.0000 ug | ORAL_TABLET | Freq: Every day | ORAL | 0 refills | Status: DC

## 2017-10-17 NOTE — Telephone Encounter (Signed)
Spoke with pt and informed her of her thyroid results. Pt stated that she has been taking her levothyroxine daily every morning 30 minutes before eating anything and she is not taking it with any other medications. The pt did state that she has been feeling "off" for some time now and was wondering if the thyroid was the cause.  Pt stated that she does not use her mychart account that is way she did not see the results.

## 2017-10-17 NOTE — Telephone Encounter (Signed)
I have increased her dose of synthroid to 125  Mcg.  Start ASAP.  Repeat labs on or around march 1

## 2017-10-17 NOTE — Telephone Encounter (Signed)
Copied from [place] [phone]. Topic: Quick Communication - Lab Results >> Oct 17, 2017  9:43 AM Darl Householder, RMA wrote: Patient is requesting lab results, please call pt back

## 2017-10-17 NOTE — Telephone Encounter (Signed)
Please advise 

## 2017-10-19 NOTE — Telephone Encounter (Signed)
LMTCB. PEC may speak with the pt.

## 2017-10-19 NOTE — Telephone Encounter (Signed)
Patient returned call re lab results.  She will increase Synthroid to 125 mcg. She will phone back to schedule repeat labs.

## 2017-10-20 ENCOUNTER — Other Ambulatory Visit: Payer: Self-pay | Admitting: Internal Medicine

## 2017-10-20 DIAGNOSIS — G894 Chronic pain syndrome: Secondary | ICD-10-CM | POA: Diagnosis not present

## 2017-10-20 DIAGNOSIS — Z79899 Other long term (current) drug therapy: Secondary | ICD-10-CM | POA: Diagnosis not present

## 2017-10-20 NOTE — Telephone Encounter (Signed)
Pt only has 1 pill left. And ur hoping it can be filled today since she has to pick up meds today. Pt requested pharmacy to call it in last week and they stated it was to early

## 2017-10-26 DIAGNOSIS — Z859 Personal history of malignant neoplasm, unspecified: Secondary | ICD-10-CM | POA: Diagnosis not present

## 2017-10-26 DIAGNOSIS — Z86010 Personal history of colonic polyps: Secondary | ICD-10-CM | POA: Diagnosis not present

## 2017-10-26 DIAGNOSIS — Z85828 Personal history of other malignant neoplasm of skin: Secondary | ICD-10-CM | POA: Diagnosis not present

## 2017-10-26 DIAGNOSIS — Z1371 Encounter for nonprocreative screening for genetic disease carrier status: Secondary | ICD-10-CM | POA: Diagnosis not present

## 2017-10-26 DIAGNOSIS — Z85820 Personal history of malignant melanoma of skin: Secondary | ICD-10-CM | POA: Diagnosis not present

## 2017-10-26 DIAGNOSIS — Z1509 Genetic susceptibility to other malignant neoplasm: Secondary | ICD-10-CM | POA: Diagnosis not present

## 2017-10-27 DIAGNOSIS — I259 Chronic ischemic heart disease, unspecified: Secondary | ICD-10-CM | POA: Diagnosis not present

## 2017-10-27 DIAGNOSIS — Z7902 Long term (current) use of antithrombotics/antiplatelets: Secondary | ICD-10-CM | POA: Diagnosis not present

## 2017-10-27 DIAGNOSIS — Z1379 Encounter for other screening for genetic and chromosomal anomalies: Secondary | ICD-10-CM | POA: Diagnosis not present

## 2017-10-27 DIAGNOSIS — F3130 Bipolar disorder, current episode depressed, mild or moderate severity, unspecified: Secondary | ICD-10-CM | POA: Diagnosis not present

## 2017-10-27 DIAGNOSIS — F339 Major depressive disorder, recurrent, unspecified: Secondary | ICD-10-CM | POA: Diagnosis not present

## 2017-10-27 DIAGNOSIS — Z7901 Long term (current) use of anticoagulants: Secondary | ICD-10-CM | POA: Diagnosis not present

## 2017-11-22 DIAGNOSIS — G4700 Insomnia, unspecified: Secondary | ICD-10-CM | POA: Diagnosis not present

## 2017-11-22 DIAGNOSIS — Z79891 Long term (current) use of opiate analgesic: Secondary | ICD-10-CM | POA: Diagnosis not present

## 2017-11-22 DIAGNOSIS — M961 Postlaminectomy syndrome, not elsewhere classified: Secondary | ICD-10-CM | POA: Diagnosis not present

## 2017-11-22 DIAGNOSIS — G894 Chronic pain syndrome: Secondary | ICD-10-CM | POA: Diagnosis not present

## 2017-12-15 ENCOUNTER — Telehealth: Payer: Self-pay | Admitting: Internal Medicine

## 2017-12-15 NOTE — Telephone Encounter (Signed)
Copied from [place] [phone]. Topic: Quick Communication - Rx Refill/Question >> Dec 15, 2017  1:47 PM Percell Belt A wrote: Medication: ipratropium (ATROVENT) 0.06 % nasal spray [[id number]]    Has the patient contacted their pharmacy? {no    (Agent: If no, request that the patient contact the pharmacy for the refill.)   Preferred Pharmacy (with phone number or street name): pt called in and said that she is having a hard time making this for the full month.  Pharmacy told her she could not get it til the 20th but she states she needs it before then.   [facility]    Agent: Please be advised that RX refills may take up to 3 business days. We ask that you follow-up with your pharmacy.

## 2017-12-16 ENCOUNTER — Other Ambulatory Visit: Payer: Self-pay

## 2017-12-16 MED ORDER — IPRATROPIUM BROMIDE 0.06 % NA SOLN: NASAL | 5 refills | Status: DC

## 2017-12-22 DIAGNOSIS — M7060 Trochanteric bursitis, unspecified hip: Secondary | ICD-10-CM | POA: Diagnosis not present

## 2017-12-22 DIAGNOSIS — M7062 Trochanteric bursitis, left hip: Secondary | ICD-10-CM | POA: Diagnosis not present

## 2017-12-22 DIAGNOSIS — M7061 Trochanteric bursitis, right hip: Secondary | ICD-10-CM | POA: Diagnosis not present

## 2018-01-03 ENCOUNTER — Other Ambulatory Visit: Payer: Self-pay | Admitting: Internal Medicine

## 2018-01-03 ENCOUNTER — Ambulatory Visit: Payer: Self-pay

## 2018-01-03 ENCOUNTER — Ambulatory Visit: Payer: Medicare Other | Admitting: Internal Medicine

## 2018-01-03 DIAGNOSIS — R079 Chest pain, unspecified: Secondary | ICD-10-CM | POA: Diagnosis not present

## 2018-01-03 DIAGNOSIS — M961 Postlaminectomy syndrome, not elsewhere classified: Secondary | ICD-10-CM | POA: Diagnosis not present

## 2018-01-03 DIAGNOSIS — G4700 Insomnia, unspecified: Secondary | ICD-10-CM | POA: Diagnosis not present

## 2018-01-03 DIAGNOSIS — G894 Chronic pain syndrome: Secondary | ICD-10-CM | POA: Diagnosis not present

## 2018-01-03 NOTE — Telephone Encounter (Signed)
Please advise 

## 2018-01-03 NOTE — Telephone Encounter (Addendum)
Patient has chest tightness, saw pain doctor yesterday and advised him  She has been having the chest tightness for two weeks, does not worsen with exertion stays constant, patient says she does not believe this is heart related she thinks it is chest wall related because of 2 week HX the Patient scheduled for tomorrow at 5 does not want to see anyone but PCP.

## 2018-01-03 NOTE — Telephone Encounter (Signed)
Patient called in with c/o "chest tightness."  She says "it started about 2 weeks ago, no pain, just tightness. It's located across the sternum between my breasts, no radiation. I did feel a sensation down my arm at one time, but I just ignored it and it went away." I asked has this tightness been constant since it started or come and go, she says "it's been constant and is moderate."  I asked about other symptoms, such as nausea, dizziness, vomiting, sweating, fever, cough-she denies. According to protocol, see PCP within 24 hours. No availability with provider. Patient says "I don't want to see anyone else." I advised her to go to the ED, since she didn't want to see another provider in the practice. She says "this doesn't make any sense that I can't see my doctor when I need to. I don't want to go to the ER, because I have dealt with this for 2 weeks and if I felt it was my heart, I would go." I asked her to hold while I call the office to see if there is any way she can be seen, she agreed. I called the office and spoke to the [facility], who asked Dr. Lupita Dawn nurse about an appointment, she was told the patient would need to go to the ED to be evaluated with the chest tightness. I advised the patient that she would need to be evaluated in the ED. She says "this is ridiculous. I need to talk to someone over everything to see what can be done, a Freight forwarder or someone. This makes no sense that I can't see my doctor when I need to because she is so booked up. Something will need to be done about this." I asked the patient to hold while I called the office again. I spoke to [place] who asked me to transfer the patient over to the office to speak to the Practice Administrator, patient was conference in and transferred.   Reason for Disposition . [1] Chest pain lasts > 5 minutes AND [2] occurred > 3 days ago (72 hours) AND [3] NO chest pain or cardiac symptoms now  Answer Assessment - Initial Assessment  Questions 1. LOCATION: "Where does it hurt?"       In the center across the sternum between breasts 2. RADIATION: "Does the pain go anywhere else?" (e.g., into neck, jaw, arms, back)     No 3. ONSET: "When did the chest pain begin?" (Minutes, hours or days)       A couple of weeks ago, tightness no pain 4. PATTERN "Does the pain come and go, or has it been constant since it started?"  "Does it get worse with exertion?"      Stays constant; No worse-very limited in physical activity due to surgeries 5. DURATION: "How long does it last" (e.g., seconds, minutes, hours)     N/A 6. SEVERITY: "How bad is the pain?"  (e.g., Scale 1-10; mild, moderate, or severe)    - MILD (1-3): doesn't interfere with normal activities     - MODERATE (4-7): interferes with normal activities or awakens from sleep    - SEVERE (8-10): excruciating pain, unable to do any normal activities       Moderate 7. CARDIAC RISK FACTORS: "Do you have any history of heart problems or risk factors for heart disease?" (e.g., prior heart attack, angina; high blood pressure, diabetes, being overweight, high cholesterol, smoking, or strong family history of heart disease)  No; yes family history of HTN 8. PULMONARY RISK FACTORS: "Do you have any history of lung disease?"  (e.g., blood clots in lung, asthma, emphysema, birth control pills)     No 9. CAUSE: "What do you think is causing the chest pain?"     No idea 10. OTHER SYMPTOMS: "Do you have any other symptoms?" (e.g., dizziness, nausea, vomiting, sweating, fever, difficulty breathing, cough)       No 11. PREGNANCY: "Is there any chance you are pregnant?" "When was your last menstrual period?"       No  Protocols used: CHEST PAIN-A-AH

## 2018-01-04 ENCOUNTER — Ambulatory Visit: Payer: Medicare Other | Admitting: Internal Medicine

## 2018-01-04 DIAGNOSIS — Z0289 Encounter for other administrative examinations: Secondary | ICD-10-CM

## 2018-01-04 NOTE — Telephone Encounter (Signed)
Printed, signed and faxed.  

## 2018-01-04 NOTE — Telephone Encounter (Signed)
refilled 

## 2018-01-04 NOTE — Telephone Encounter (Signed)
Refilled: 05/31/2017 Last OV: 08/10/2017 Next OV: 01/04/2018

## 2018-01-05 ENCOUNTER — Telehealth: Payer: Self-pay | Admitting: Internal Medicine

## 2018-01-05 NOTE — Telephone Encounter (Signed)
Called patient to follow up on appointment from 01/04/18 no answer left voicemail to call office. PEC may advise.

## 2018-01-05 NOTE — Telephone Encounter (Signed)
Copied from [place]. Topic: General - Other >> Jan 04, 2018  5:19 PM Valla Leaver wrote: Reason for CRM: Patient calling to apologize for missing her appt w/ Dr. Derrel Nip today at [place]. Her alarm clock did not go off and she has been feeling exhausted. She was coming in for chest tightness. Please f/u with her. She apologizes to [name].

## 2018-01-05 NOTE — Telephone Encounter (Signed)
I cannot see her today I am already missing lunch to see other patinets

## 2018-01-05 NOTE — Telephone Encounter (Signed)
Scheduled 10  Tomorrow.

## 2018-01-06 ENCOUNTER — Observation Stay (HOSPITAL_COMMUNITY)
Admission: EM | Admit: 2018-01-06 | Discharge: 2018-01-09 | Disposition: A | Discharge status: Home / Self Care | Payer: Medicare Other | Attending: Internal Medicine | Admitting: Internal Medicine

## 2018-01-06 ENCOUNTER — Other Ambulatory Visit: Payer: Self-pay

## 2018-01-06 ENCOUNTER — Ambulatory Visit (INDEPENDENT_AMBULATORY_CARE_PROVIDER_SITE_OTHER): Payer: Medicare Other

## 2018-01-06 ENCOUNTER — Telehealth: Payer: Self-pay | Admitting: Internal Medicine

## 2018-01-06 ENCOUNTER — Encounter (HOSPITAL_COMMUNITY): Payer: Self-pay | Admitting: Emergency Medicine

## 2018-01-06 ENCOUNTER — Encounter: Payer: Self-pay | Admitting: Internal Medicine

## 2018-01-06 ENCOUNTER — Observation Stay (HOSPITAL_COMMUNITY): Payer: Medicare Other

## 2018-01-06 ENCOUNTER — Ambulatory Visit (INDEPENDENT_AMBULATORY_CARE_PROVIDER_SITE_OTHER): Payer: Medicare Other | Admitting: Internal Medicine

## 2018-01-06 VITALS — BP 120/64 | HR 65 | Temp 98.0°F | Resp 15 | Ht 67.0 in | Wt 139.2 lb

## 2018-01-06 DIAGNOSIS — E890 Postprocedural hypothyroidism: Secondary | ICD-10-CM | POA: Diagnosis not present

## 2018-01-06 DIAGNOSIS — Z96611 Presence of right artificial shoulder joint: Secondary | ICD-10-CM | POA: Insufficient documentation

## 2018-01-06 DIAGNOSIS — R0602 Shortness of breath: Secondary | ICD-10-CM | POA: Diagnosis not present

## 2018-01-06 DIAGNOSIS — Z79891 Long term (current) use of opiate analgesic: Secondary | ICD-10-CM | POA: Insufficient documentation

## 2018-01-06 DIAGNOSIS — G629 Polyneuropathy, unspecified: Secondary | ICD-10-CM | POA: Diagnosis not present

## 2018-01-06 DIAGNOSIS — I200 Unstable angina: Secondary | ICD-10-CM | POA: Diagnosis not present

## 2018-01-06 DIAGNOSIS — R002 Palpitations: Secondary | ICD-10-CM | POA: Diagnosis not present

## 2018-01-06 DIAGNOSIS — G8929 Other chronic pain: Secondary | ICD-10-CM | POA: Diagnosis not present

## 2018-01-06 DIAGNOSIS — M26609 Unspecified temporomandibular joint disorder, unspecified side: Secondary | ICD-10-CM | POA: Diagnosis not present

## 2018-01-06 DIAGNOSIS — R2681 Unsteadiness on feet: Secondary | ICD-10-CM | POA: Diagnosis not present

## 2018-01-06 DIAGNOSIS — Z96653 Presence of artificial knee joint, bilateral: Secondary | ICD-10-CM | POA: Insufficient documentation

## 2018-01-06 DIAGNOSIS — I6523 Occlusion and stenosis of bilateral carotid arteries: Secondary | ICD-10-CM | POA: Insufficient documentation

## 2018-01-06 DIAGNOSIS — E039 Hypothyroidism, unspecified: Secondary | ICD-10-CM | POA: Diagnosis present

## 2018-01-06 DIAGNOSIS — R600 Localized edema: Secondary | ICD-10-CM | POA: Insufficient documentation

## 2018-01-06 DIAGNOSIS — F419 Anxiety disorder, unspecified: Secondary | ICD-10-CM | POA: Diagnosis not present

## 2018-01-06 DIAGNOSIS — R072 Precordial pain: Secondary | ICD-10-CM

## 2018-01-06 DIAGNOSIS — R32 Unspecified urinary incontinence: Secondary | ICD-10-CM | POA: Insufficient documentation

## 2018-01-06 DIAGNOSIS — Z880 Allergy status to penicillin: Secondary | ICD-10-CM | POA: Insufficient documentation

## 2018-01-06 DIAGNOSIS — R490 Dysphonia: Secondary | ICD-10-CM | POA: Diagnosis not present

## 2018-01-06 DIAGNOSIS — R0789 Other chest pain: Secondary | ICD-10-CM

## 2018-01-06 DIAGNOSIS — K5903 Drug induced constipation: Secondary | ICD-10-CM

## 2018-01-06 DIAGNOSIS — G4700 Insomnia, unspecified: Secondary | ICD-10-CM | POA: Insufficient documentation

## 2018-01-06 DIAGNOSIS — Z8249 Family history of ischemic heart disease and other diseases of the circulatory system: Secondary | ICD-10-CM | POA: Insufficient documentation

## 2018-01-06 DIAGNOSIS — R079 Chest pain, unspecified: Secondary | ICD-10-CM

## 2018-01-06 DIAGNOSIS — T402X5A Adverse effect of other opioids, initial encounter: Secondary | ICD-10-CM | POA: Diagnosis not present

## 2018-01-06 DIAGNOSIS — Z90710 Acquired absence of both cervix and uterus: Secondary | ICD-10-CM | POA: Insufficient documentation

## 2018-01-06 DIAGNOSIS — R0600 Dyspnea, unspecified: Secondary | ICD-10-CM | POA: Insufficient documentation

## 2018-01-06 DIAGNOSIS — Z823 Family history of stroke: Secondary | ICD-10-CM | POA: Insufficient documentation

## 2018-01-06 DIAGNOSIS — Z808 Family history of malignant neoplasm of other organs or systems: Secondary | ICD-10-CM | POA: Insufficient documentation

## 2018-01-06 DIAGNOSIS — M8580 Other specified disorders of bone density and structure, unspecified site: Secondary | ICD-10-CM | POA: Diagnosis not present

## 2018-01-06 DIAGNOSIS — T82598A Other mechanical complication of other cardiac and vascular devices and implants, initial encounter: Secondary | ICD-10-CM

## 2018-01-06 DIAGNOSIS — M549 Dorsalgia, unspecified: Secondary | ICD-10-CM | POA: Diagnosis not present

## 2018-01-06 DIAGNOSIS — M1990 Unspecified osteoarthritis, unspecified site: Secondary | ICD-10-CM | POA: Diagnosis present

## 2018-01-06 DIAGNOSIS — Z8261 Family history of arthritis: Secondary | ICD-10-CM | POA: Insufficient documentation

## 2018-01-06 DIAGNOSIS — Z800 Family history of malignant neoplasm of digestive organs: Secondary | ICD-10-CM | POA: Insufficient documentation

## 2018-01-06 DIAGNOSIS — R51 Headache: Secondary | ICD-10-CM | POA: Insufficient documentation

## 2018-01-06 DIAGNOSIS — M542 Cervicalgia: Secondary | ICD-10-CM | POA: Insufficient documentation

## 2018-01-06 DIAGNOSIS — F1190 Opioid use, unspecified, uncomplicated: Secondary | ICD-10-CM

## 2018-01-06 DIAGNOSIS — Z881 Allergy status to other antibiotic agents status: Secondary | ICD-10-CM | POA: Insufficient documentation

## 2018-01-06 DIAGNOSIS — M419 Scoliosis, unspecified: Secondary | ICD-10-CM | POA: Insufficient documentation

## 2018-01-06 DIAGNOSIS — K5909 Other constipation: Secondary | ICD-10-CM | POA: Diagnosis not present

## 2018-01-06 DIAGNOSIS — Z87891 Personal history of nicotine dependence: Secondary | ICD-10-CM | POA: Insufficient documentation

## 2018-01-06 DIAGNOSIS — Z8349 Family history of other endocrine, nutritional and metabolic diseases: Secondary | ICD-10-CM | POA: Insufficient documentation

## 2018-01-06 DIAGNOSIS — Z885 Allergy status to narcotic agent status: Secondary | ICD-10-CM | POA: Insufficient documentation

## 2018-01-06 DIAGNOSIS — Z79899 Other long term (current) drug therapy: Secondary | ICD-10-CM | POA: Diagnosis not present

## 2018-01-06 LAB — COMPREHENSIVE METABOLIC PANEL
ALT: 17 U/L (ref 0–35)
AST: 25 U/L (ref 0–37)
Albumin: 3.8 g/dL (ref 3.5–5.2)
Alkaline Phosphatase: 54 U/L (ref 39–117)
BUN: 30 mg/dL — ABNORMAL HIGH (ref 6–23)
CO2: 35 mEq/L — ABNORMAL HIGH (ref 19–32)
Calcium: 9.1 mg/dL (ref 8.4–10.5)
Chloride: 101 mEq/L (ref 96–112)
Creatinine, Ser: 0.72 mg/dL (ref 0.40–1.20)
GFR: 85.07 mL/min (ref >60.00)
Glucose, Bld: 80 mg/dL (ref 70–99)
Potassium: 3.6 mEq/L (ref 3.5–5.1)
Sodium: 140 mEq/L (ref 135–145)
Total Bilirubin: 0.5 mg/dL (ref 0.2–1.2)
Total Protein: 6.3 g/dL (ref 6.0–8.3)

## 2018-01-06 LAB — BRAIN NATRIURETIC PEPTIDE: B Natriuretic Peptide: 43.1 pg/mL (ref 0.0–100.0)

## 2018-01-06 LAB — BASIC METABOLIC PANEL
Anion gap: 10 (ref 5–15)
BUN: 30 mg/dL — ABNORMAL HIGH (ref 6–20)
CO2: 30 mmol/L (ref 22–32)
Calcium: 9.3 mg/dL (ref 8.9–10.3)
Chloride: 102 mmol/L (ref 101–111)
Creatinine, Ser: 0.93 mg/dL (ref 0.44–1.00)
GFR calc Af Amer: >60 mL/min (ref >60)
GFR calc non Af Amer: >60 mL/min (ref >60)
Glucose, Bld: 68 mg/dL (ref 65–99)
Potassium: 3.8 mmol/L (ref 3.5–5.1)
Sodium: 142 mmol/L (ref 135–145)

## 2018-01-06 LAB — CBC WITH DIFFERENTIAL/PLATELET
Basophils Absolute: 0 10*3/uL (ref 0.0–0.1)
Basophils Relative: 0.5 % (ref 0.0–3.0)
Eosinophils Absolute: 0.1 10*3/uL (ref 0.0–0.7)
Eosinophils Relative: 1.3 % (ref 0.0–5.0)
HCT: 37.5 % (ref 36.0–46.0)
Hemoglobin: 12.4 g/dL (ref 12.0–15.0)
Lymphocytes Relative: 27.4 % (ref 12.0–46.0)
Lymphs Abs: 1.5 10*3/uL (ref 0.7–4.0)
MCHC: 33.2 g/dL (ref 30.0–36.0)
MCV: 95.6 fl (ref 78.0–100.0)
Monocytes Absolute: 0.4 10*3/uL (ref 0.1–1.0)
Monocytes Relative: 6.6 % (ref 3.0–12.0)
Neutro Abs: 3.5 10*3/uL (ref 1.4–7.7)
Neutrophils Relative %: 64.2 % (ref 43.0–77.0)
Platelets: 153 10*3/uL (ref 150.0–400.0)
RBC: 3.93 Mil/uL (ref 3.87–5.11)
RDW: 13.3 % (ref 11.5–15.5)
WBC: 5.5 10*3/uL (ref 4.0–10.5)

## 2018-01-06 LAB — CK TOTAL AND CKMB (NOT AT ARMC)
CK, MB: 22.4 ng/mL — ABNORMAL HIGH (ref 0–5.0)
Relative Index: 5.7 — ABNORMAL HIGH (ref 0–4.0)
Total CK: 394 U/L — ABNORMAL HIGH (ref 29–143)

## 2018-01-06 LAB — D-DIMER, QUANTITATIVE: D-Dimer, Quant: 3.25 ug/mL-FEU — ABNORMAL HIGH (ref 0.00–0.50)

## 2018-01-06 LAB — I-STAT TROPONIN, ED
Troponin i, poc: 0 ng/mL (ref 0.00–0.08)
Troponin i, poc: 0.01 ng/mL (ref 0.00–0.08)

## 2018-01-06 LAB — CBC
HCT: 40.1 % (ref 36.0–46.0)
Hemoglobin: 12.7 g/dL (ref 12.0–15.0)
MCH: 30.8 pg (ref 26.0–34.0)
MCHC: 31.7 g/dL (ref 30.0–36.0)
MCV: 97.1 fL (ref 78.0–100.0)
Platelets: 161 10*3/uL (ref 150–400)
RBC: 4.13 MIL/uL (ref 3.87–5.11)
RDW: 12.9 % (ref 11.5–15.5)
WBC: 5.7 10*3/uL (ref 4.0–10.5)

## 2018-01-06 LAB — TROPONIN I: TNIDX: 0.01 ug/l (ref 0.00–0.06)

## 2018-01-06 LAB — MAGNESIUM: Magnesium: 1.8 mg/dL (ref 1.5–2.5)

## 2018-01-06 LAB — TSH: TSH: 1.88 u[IU]/mL (ref 0.35–4.50)

## 2018-01-06 MED ORDER — LEVOTHYROXINE SODIUM 25 MCG PO TABS
125.0000 ug | ORAL_TABLET | Freq: Every day | ORAL | Status: DC
  Administered 2018-01-07 – 2018-01-09 (×3): 125 ug via ORAL
  Filled 2018-01-06 (×3): qty 1

## 2018-01-06 MED ORDER — ZOLPIDEM TARTRATE 5 MG PO TABS: 5.0000 mg | ORAL_TABLET | Freq: Every evening | ORAL | Status: DC | PRN

## 2018-01-06 MED ORDER — OXYCODONE HCL 5 MG PO TABS: 5.0000 mg | ORAL_TABLET | Freq: Four times a day (QID) | ORAL | Status: DC | PRN

## 2018-01-06 MED ORDER — NITROGLYCERIN 0.4 MG SL SUBL
0.4000 mg | SUBLINGUAL_TABLET | SUBLINGUAL | Status: AC | PRN
  Administered 2018-01-06 – 2018-01-07 (×3): 0.4 mg via SUBLINGUAL
  Filled 2018-01-06 (×3): qty 1

## 2018-01-06 MED ORDER — GABAPENTIN 300 MG PO CAPS
300.0000 mg | ORAL_CAPSULE | Freq: Three times a day (TID) | ORAL | Status: DC
  Administered 2018-01-06: 300 mg via ORAL
  Filled 2018-01-06: qty 1

## 2018-01-06 MED ORDER — ACETAMINOPHEN 325 MG PO TABS
650.0000 mg | ORAL_TABLET | ORAL | Status: DC | PRN
  Administered 2018-01-08 – 2018-01-09 (×2): 650 mg via ORAL
  Filled 2018-01-06 (×2): qty 2

## 2018-01-06 MED ORDER — DIAZEPAM 5 MG PO TABS
5.0000 mg | ORAL_TABLET | Freq: Every day | ORAL | Status: DC
  Administered 2018-01-06 – 2018-01-08 (×3): 5 mg via ORAL
  Filled 2018-01-06 (×3): qty 1

## 2018-01-06 MED ORDER — NALOXEGOL OXALATE 25 MG PO TABS
25.0000 mg | ORAL_TABLET | Freq: Every day | ORAL | Status: DC
  Administered 2018-01-07 – 2018-01-08 (×2): 25 mg via ORAL
  Filled 2018-01-06 (×3): qty 1

## 2018-01-06 MED ORDER — ASPIRIN 81 MG PO CHEW
324.0000 mg | CHEWABLE_TABLET | Freq: Once | ORAL | Status: AC
  Administered 2018-01-06: 324 mg via ORAL
  Filled 2018-01-06: qty 4

## 2018-01-06 MED ORDER — TIZANIDINE HCL 4 MG PO TABS
4.0000 mg | ORAL_TABLET | Freq: Four times a day (QID) | ORAL | Status: DC | PRN
  Administered 2018-01-06: 4 mg via ORAL
  Filled 2018-01-06: qty 1

## 2018-01-06 MED ORDER — IPRATROPIUM BROMIDE 0.06 % NA SOLN
1.0000 | Freq: Four times a day (QID) | NASAL | Status: DC
  Administered 2018-01-07 – 2018-01-09 (×9): 1 via NASAL
  Filled 2018-01-06: qty 15

## 2018-01-06 MED ORDER — MONTELUKAST SODIUM 10 MG PO TABS
10.0000 mg | ORAL_TABLET | Freq: Every day | ORAL | Status: DC
  Administered 2018-01-06 – 2018-01-08 (×3): 10 mg via ORAL
  Filled 2018-01-06 (×3): qty 1

## 2018-01-06 MED ORDER — MORPHINE SULFATE 15 MG PO TABS: 30.0000 mg | ORAL_TABLET | Freq: Two times a day (BID) | ORAL | Status: DC

## 2018-01-06 MED ORDER — IOPAMIDOL (ISOVUE-370) INJECTION 76%
INTRAVENOUS | Status: AC
  Filled 2018-01-06: qty 100

## 2018-01-06 MED ORDER — POLYETHYLENE GLYCOL 3350 17 G PO PACK: 17.0000 g | PACK | Freq: Every day | ORAL | Status: DC | PRN

## 2018-01-06 MED ORDER — MORPHINE SULFATE 15 MG PO TABS
30.0000 mg | ORAL_TABLET | Freq: Two times a day (BID) | ORAL | Status: DC
  Administered 2018-01-06 – 2018-01-09 (×6): 30 mg via ORAL
  Filled 2018-01-06 (×7): qty 2

## 2018-01-06 MED ORDER — ENOXAPARIN SODIUM 40 MG/0.4ML ~~LOC~~ SOLN
40.0000 mg | SUBCUTANEOUS | Status: DC
  Administered 2018-01-06 – 2018-01-09 (×3): 40 mg via SUBCUTANEOUS
  Filled 2018-01-06 (×3): qty 0.4

## 2018-01-06 MED ORDER — ONDANSETRON HCL 4 MG/2ML IJ SOLN: 4.0000 mg | Freq: Four times a day (QID) | INTRAMUSCULAR | Status: DC | PRN

## 2018-01-06 MED ORDER — METHOCARBAMOL 500 MG PO TABS
1000.0000 mg | ORAL_TABLET | Freq: Two times a day (BID) | ORAL | Status: DC
  Administered 2018-01-06: 1000 mg via ORAL
  Filled 2018-01-06: qty 2

## 2018-01-06 MED ORDER — MORPHINE SULFATE (PF) 4 MG/ML IV SOLN: 6.0000 mg | Freq: Once | INTRAVENOUS | Status: DC

## 2018-01-06 NOTE — ED Notes (Signed)
Pt reports chest tightness and going to her PCP this morning. Pt was then called to get to the ED because she had elevated cardiac enzymes.

## 2018-01-06 NOTE — Progress Notes (Signed)
Subjective:  Patient ID: Sonya Cordova, female    DOB: 18-Aug-1946  Age: 72 y.o. MRN: [id number]  CC: The primary encounter diagnosis was Palpitations. Diagnoses of Chest tightness and Chest tightness or pressure were also pertinent to this visit.  HPI [name] presents for  Follow up on chest tightness for at  4 weeks,  wosre in the last 2 weeks not aggravated by exertion , acc'd by rapid heart beat lasting ten seconds or so   That does not occur daily .   Lasts all day,  Fades in intensity at times but is never completely gone.  Denies shortness of breath,  But has trouble drawing a deep breath,  No cough.  By the end of the day cannot tolerate anything constrictive around her chest wal.    Had a complicated had back surgery in 3 years .  No physical exertion due to back pain .   Does not get massage , can't afford  Thinks it is stress ,  Life is more complicated than ever before.  Financial stressors mostly.      Outpatient Medications Prior to Visit  Medication Sig Dispense Refill  . Cholecalciferol (VITAMIN D3) 1000 units CAPS Take 1 capsule by mouth daily.    . diazepam (VALIUM) 5 MG tablet TAKE 1 TABLET BY MOUTH AT BEDTIME AS NEEDED FOR ANXIETY 30 tablet 1  . Eszopiclone (ESZOPICLONE) 3 MG TABS Take 3 mg by mouth at bedtime. Take immediately before bedtime. Name Brand ONLY    . gabapentin (NEURONTIN) 100 MG capsule Take 300 mg by mouth 3 (three) times daily.    Marland Kitchen ibuprofen (ADVIL,MOTRIN) 600 MG tablet Take 600 mg by mouth as needed.    Marland Kitchen ipratropium (ATROVENT) 0.06 % nasal spray USE TWO SPRAY(S) IN EACH NOSTRIL 4 TIMES DAILY 15 mL 5  . Lactobacillus (PROBIOTIC ACIDOPHILUS PO) Take 1 capsule by mouth daily.    Marland Kitchen levothyroxine (SYNTHROID) 125 MCG tablet Take 1 tablet (125 mcg total) by mouth daily before breakfast. 90 tablet 0  . methocarbamol (ROBAXIN) 500 MG tablet Take 1,000 mg by mouth Every 6 hours.     . montelukast (SINGULAIR) 10 MG tablet TAKE ONE TABLET BY MOUTH AT  BEDTIME 90 tablet 2  . morphine (MSIR) 30 MG tablet Take 60 mg by mouth every 6 (six) hours as needed (takes 3xdaily). pain    . naloxegol oxalate (MOVANTIK) 25 MG TABS tablet Take 25 mg by mouth daily as needed.    . polyethylene glycol (MIRALAX / GLYCOLAX) packet Take 17 g by mouth as needed. constipation    . Red Yeast Rice 600 MG CAPS Take 600 mg by mouth 2 (two) times daily.    Marland Kitchen tiZANidine (ZANAFLEX) 4 MG tablet Take 1 tablet (4 mg total) by mouth every 6 (six) hours as needed for muscle spasms. 90 tablet 1  . torsemide (DEMADEX) 5 MG tablet Take 1 tablet (5 mg total) by mouth daily as needed. 90 tablet 0  . mirabegron ER (MYRBETRIQ) 25 MG TB24 tablet Take 1 tablet (25 mg total) daily by mouth. (Patient not taking: Reported on 01/06/2018) 30 tablet 5  . estradiol (ESTRACE) 1 MG tablet Take 1 tablet (1 mg total) by mouth daily. (Patient not taking: Reported on 01/06/2018) 90 tablet 1   No facility-administered medications prior to visit.     Review of Systems;  Patient denies headache, fevers, malaise, unintentional weight loss, skin rash, eye pain, sinus congestion and sinus pain, sore  throat, dysphagia,  hemoptysis , cough, dyspnea, wheezing, chest pain, palpitations, orthopnea, edema, abdominal pain, nausea, melena, diarrhea, constipation, flank pain, dysuria, hematuria, urinary  Frequency, nocturia, numbness, tingling, seizures,  Focal weakness, Loss of consciousness,  Tremor, insomnia, depression, anxiety, and suicidal ideation.      Objective:  BP 120/64 (BP Location: Left Arm, Patient Position: Sitting, Cuff Size: Normal)   Pulse 65   Temp 98 F (36.7 C) (Oral)   Resp 15   Ht 5\' 7"  (1.702 m)   Wt 139 lb 3.2 oz (63.1 kg)   SpO2 97%   BMI 21.80 kg/m   BP Readings from Last 3 Encounters:  01/06/18 120/64  08/10/17 100/64  07/20/17 110/68    Wt Readings from Last 3 Encounters:  01/06/18 139 lb 3.2 oz (63.1 kg)  08/10/17 138 lb 9.6 oz (62.9 kg)  06/14/17 138 lb 12.8 oz (63  kg)    General appearance: alert, cooperative and appears stated age Ears: normal TM's and external ear canals both ears Throat: lips, mucosa, and tongue normal; teeth and gums normal Neck: no adenopathy, no carotid bruit, supple, symmetrical, trachea midline and thyroid not enlarged, symmetric, no tenderness/mass/nodules Back: symmetric, no curvature. ROM normal. No CVA tenderness. Lungs: clear to auscultation bilaterally Heart: regular rate and rhythm, S1, S2 normal, no murmur, click, rub or gallop Abdomen: soft, non-tender; bowel sounds normal; no masses,  no organomegaly Pulses: 2+ and symmetric Skin: Skin color, texture, turgor normal. No rashes or lesions Lymph nodes: Cervical, supraclavicular, and axillary nodes normal.  No results found for: HGBA1C  Lab Results  Component Value Date   CREATININE 0.95 06/14/2017   CREATININE 0.75 08/31/2016   CREATININE 0.83 10/14/2015    Lab Results  Component Value Date   WBC 5.1 08/31/2016   HGB 12.2 08/31/2016   HCT 36.8 08/31/2016   PLT 156.0 08/31/2016   GLUCOSE 99 06/14/2017   CHOL 189 08/31/2016   TRIG 65.0 08/31/2016   HDL 73.70 08/31/2016   LDLDIRECT 122.0 06/14/2017   LDLCALC 102 (H) 08/31/2016   ALT 14 06/14/2017   AST 21 06/14/2017   NA 140 06/14/2017   K 4.1 06/14/2017   CL 99 06/14/2017   CREATININE 0.95 06/14/2017   BUN 25 (H) 06/14/2017   CO2 33 (H) 06/14/2017   TSH 25.43 (H) 10/12/2017   INR 0.85 05/16/2012   MICROALBUR 0.3 11/01/2012    Ct Cervical Spine Wo Contrast  Result Date: 03/22/2017 CLINICAL DATA:  Cervicothoracic spondylosis. Right arm and shoulder pain. Right arm weakness. Neck pain and headache. EXAM: CT CERVICAL SPINE WITHOUT CONTRAST TECHNIQUE: Multidetector CT imaging of the cervical spine was performed without intravenous contrast. Multiplanar CT image reconstructions were also generated. COMPARISON:  MRI dated 01/05/2016 FINDINGS: Alignment: Normal. Skull base and vertebrae: Degenerative  arthritic changes between the anterior arch of C1 and the odontoid. Solid anterior fusion from C4 through C7. Solid posterior fusion at C6-7. Upper thoracic fusion from T3 extending distally. Soft tissues and spinal canal: Prevertebral soft tissues are normal. Surgical clips to the right of the trachea consistent with previous surgery of the thyroid gland. Disc levels: C2-3: Tiny central disc bulge with no neural impingement. Mild left facet arthritis. Widely patent neural foramina. C3-4: Moderate left facet arthritis. Slight left foraminal narrowing. Widely patent right neural foramen. No disc bulging or protrusion. C4-5 through C6-7: Solid anterior fusion. No residual impingement. No foraminal stenoses. C7-T1: Tiny central disc bulge with no neural impingement. Widely patent neural foramina. No facet arthritis.  T1-2:  Negative. T2-3:  Negative. Upper chest: Scarring in both lung apices. Other: None. IMPRESSION: 1. No findings to suggest a cause of the patient's right upper arm symptoms. 2. Solid fusion from C3 through C7. 3. Moderate facet arthritis at C3-4 on the left. Electronically Signed   By: Lorriane Shire M.D.   On: 03/22/2017 10:03    Assessment & Plan:   Problem List Items Addressed This Visit    Chest tightness or pressure    Patient has no known cardiac history. EKG was not suggestive of ischemia,  But her CK MB is markedly positive.  I am Sending patient to [facility] for  Evaluation        Other Visit Diagnoses    Palpitations    -  Primary   Relevant Orders   EKG 12-Lead   DG Chest 2 View   Comprehensive metabolic panel   TSH   Magnesium   CBC with Differential/Platelet   Chest tightness       Relevant Orders   EKG 12-Lead   DG Chest 2 View   CK total and CKMB (cardiac)not at [facility] (Completed)   Troponin I (Completed)      I have discontinued Dion Saucier. Gulden's estradiol. I am also having her maintain her morphine, gabapentin, polyethylene glycol, methocarbamol,  Eszopiclone, ibuprofen, naloxegol oxalate, Red Yeast Rice, Lactobacillus (PROBIOTIC ACIDOPHILUS PO), Vitamin D3, torsemide, montelukast, mirabegron ER, tiZANidine, levothyroxine, ipratropium, and diazepam.  No orders of the defined types were placed in this encounter.   Medications Discontinued During This Encounter  Medication Reason  . estradiol (ESTRACE) 1 MG tablet Patient has not taken in last 30 days    Follow-up: No follow-ups on file.   Crecencio Mc, MD

## 2018-01-06 NOTE — Progress Notes (Signed)
Patient arrived from ED. Complains of chest tightness  6/10. Stated that this is pain that she has been having for past 2 wks. EKG done, show NSR, VS stable, on lower side BP. Patient stated she would prefer not to take another nitro. Paged NP Baltazar Najjar on call.Placed on [place] 2 L for comfort.  Will continue to monitor.  Nygel Prokop, RN

## 2018-01-06 NOTE — Assessment & Plan Note (Signed)
Patient has no known cardiac history. EKG was not suggestive of ischemia,  But her CK MB is markedly positive.  I am Sending patient to [facility] for  Evaluation

## 2018-01-06 NOTE — Progress Notes (Signed)
Patient continues to complain of chest pressure 5/10. Refusing oxycodone and nitroglycerin at this time. States this is chronic for her.  STAT CT of chest ordered.  Will continue to monitor.  Kavin Weckwerth, RN

## 2018-01-06 NOTE — H&P (Addendum)
History and Physical    Sonya Cordova NWG:[id number] DOB: [birth year] DOA: 01/06/2018  PCP: Crecencio Mc, MD  Patient coming from: home by way of pcp's office   Chief Complaint: substernal chest pain  HPI: Sonya Cordova is a 72 y.o. female with medical history significant for hypothyroidism, scoliosis and other chronic spine disorders, multiple spine surgeries, chronic pain on chronic opioids, who presents with above.  For about 2 weeks complains of constant substernal chest pressure. Not painful. Intensity varies. But not associated w/ position or movement. Unsure if worse with breathing. Doesn't hurt with palpation. Not associated w/ exercise, though doesn't exercise. Has felt a little short of breath the last couple of weeks. No cough or hemoptysis. Occasional palpitations. No recent stressors. Denies recent immobility, though says doesn't move much 2/2 pain. No history dvt or MI. Says had exercise stress test a long time ago prior to surgery, normal. Otherwise not aware of any previous cardiac w/u. No recent med changes.  Went to pcp today with these complaints, referred to ED with ckmb returned elevated.  ED Course: labs, ekgs, nitroglycerine, aspirin.   Review of Systems: As per HPI otherwise 10 point review of systems negative.    Past Medical History:  Diagnosis Date  . Anemia   . Arthritis   . Cancer (HCC)    Basal cell  . Complication of anesthesia    "states remembers surgeon talking- was light"  . Connective tissue disease ([place])   . Contact lens/glasses fitting    wears contacts or glasses  . Degenerative disc disease 1995  . Edema of both legs   . Family history of anesthesia complication   . Headache(784.0)    migraines  . History of blood transfusion   . Hypothyroidism   . Lymphedema   . Neuropathy   . Osteopenia 02/2012   t score -1.1  . PONV (postoperative nausea and vomiting)     Past Surgical History:  Procedure Laterality Date  . abdominal  plasty    . APPENDECTOMY    . BACK SURGERY     neck,, cervical, fusion 1995-2010 ( 17 surgeries)  . BREAST ENHANCEMENT SURGERY  1978  . EYE SURGERY     as a child  . HAMMER TOE SURGERY    . hemorrhoid    . KNEE ARTHROSCOPY    . knee replace    . left elbow    . left wrist    . SPINAL CORD STIMULATOR IMPLANT  2011  . THYROIDECTOMY  1986  . TONSILLECTOMY AND ADENOIDECTOMY    . TOTAL KNEE ARTHROPLASTY     2 times right  . TOTAL KNEE ARTHROPLASTY  05/22/2012   Procedure: TOTAL KNEE ARTHROPLASTY;  Surgeon: Gearlean Alf, MD;  Location: [facility];  Service: Orthopedics;  Laterality: Left;  . TOTAL SHOULDER REPLACEMENT     2 times right shoulder  . TUBAL LIGATION    . ULNAR NERVE TRANSPOSITION Left 06/13/2013   Procedure: DECOMPRESSION, POSSIBLE TRANSPOSITION LEFT ULNAR NERVE;  Surgeon: Wynonia Sours, MD;  Location: [facility];  Service: Orthopedics;  Laterality: Left;  Marland Kitchen VAGINAL HYSTERECTOMY  1986     reports that she quit smoking about 30 years ago. Her smoking use included cigarettes. She has a 20.00 pack-year smoking history. She has never used smokeless tobacco. She reports that she does not drink alcohol or use drugs.  Allergies  Allergen Reactions  . Cleocin [Clindamycin Hcl] Anaphylaxis  . Amoxicillin-Pot Clavulanate Hives  augumentin  . Codeine Nausea And Vomiting    Family History  Problem Relation Age of Onset  . Hypertension Sister   . Stroke Sister   . Heart disease Sister   . Thyroid disease Sister   . Hypertension Mother   . Heart disease Mother   . Pancreatic cancer Mother   . Skin cancer Mother   . Rheum arthritis Mother   . Hyperlipidemia Mother   . Hypertension Father   . Heart disease Father   . Skin cancer Father   . Osteoarthritis Father   . Hypertension Brother   . Osteoarthritis Brother   . Skin cancer Sister   . Osteoarthritis Sister   . Hypertension Sister     Prior to Admission medications   Medication Sig Start Date End  Date Taking? Authorizing Provider  Cholecalciferol (VITAMIN D3) 1000 units CAPS Take 1 capsule by mouth daily.    [provider]  diazepam (VALIUM) 5 MG tablet TAKE 1 TABLET BY MOUTH AT BEDTIME AS NEEDED FOR ANXIETY 01/04/18   Crecencio Mc, MD  Eszopiclone (ESZOPICLONE) 3 MG TABS Take 3 mg by mouth at bedtime. Take immediately before bedtime. Name Brand ONLY    [provider]  gabapentin (NEURONTIN) 100 MG capsule Take 300 mg by mouth 3 (three) times daily.    [provider]  ibuprofen (ADVIL,MOTRIN) 600 MG tablet Take 600 mg by mouth as needed. 06/25/15   [provider]  ipratropium (ATROVENT) 0.06 % nasal spray USE TWO SPRAY(S) IN EACH NOSTRIL 4 TIMES DAILY 12/16/17   Crecencio Mc, MD  Lactobacillus (PROBIOTIC ACIDOPHILUS PO) Take 1 capsule by mouth daily.    [provider]  levothyroxine (SYNTHROID) 125 MCG tablet Take 1 tablet (125 mcg total) by mouth daily before breakfast. 10/17/17   Crecencio Mc, MD  methocarbamol (ROBAXIN) 500 MG tablet Take 1,000 mg by mouth Every 6 hours.  09/01/12   [provider]  mirabegron ER (MYRBETRIQ) 25 MG TB24 tablet Take 1 tablet (25 mg total) daily by mouth. Patient not taking: Reported on 01/06/2018 08/10/17   Crecencio Mc, MD  montelukast (SINGULAIR) 10 MG tablet TAKE ONE TABLET BY MOUTH AT BEDTIME 08/01/17   Crecencio Mc, MD  morphine (MSIR) 30 MG tablet Take 60 mg by mouth every 6 (six) hours as needed (takes 3xdaily). pain    [provider]  naloxegol oxalate (MOVANTIK) 25 MG TABS tablet Take 25 mg by mouth daily as needed.    [provider]  polyethylene glycol (MIRALAX / GLYCOLAX) packet Take 17 g by mouth as needed. constipation    [provider]  Red Yeast Rice 600 MG CAPS Take 600 mg by mouth 2 (two) times daily.    [provider]  tiZANidine (ZANAFLEX) 4 MG tablet Take 1 tablet (4 mg total) by mouth every 6 (six) hours as needed for muscle  spasms. 10/06/17   Crecencio Mc, MD  torsemide (DEMADEX) 5 MG tablet Take 1 tablet (5 mg total) by mouth daily as needed. 06/23/17   Crecencio Mc, MD    Physical Exam: Vitals:   01/06/18 1750 01/06/18 1830 01/06/18 1845 01/06/18 1900  BP: (!) 106/55 126/66 128/79 118/64  Pulse: 74 66 67 71  Resp: 18 18 12 12   Temp:      TempSrc:      SpO2: 99% 100% 100% 100%  Weight:      Height:        Constitutional:  No acute distress Head: Atraumatic Eyes: Conjunctiva clear ENM: Moist mucous membranes. Normal dentition.  Neck: Supple Respiratory: Clear to auscultation bilaterally, no wheezing/rales/rhonchi. Normal respiratory effort. No accessory muscle use. . Cardiovascular: Regular rate and rhythm. No murmurs/rubs/gallops. No ttp Abdomen: Non-tender, non-distended. No masses. No rebound or guarding. Positive bowel sounds. Musculoskeletal: No joint deformity upper and lower extremities. Normal ROM, spine scoliotic, deformed, multiple scars Skin: No rashes, lesions, or ulcers.  Extremities: trace b/l peripheral edema. Palpable peripheral pulses. No calf tenderness or palpable cord. Neurologic: Alert, moving all 4 extremities. Psychiatric: Normal insight and judgement.   Labs on Admission: I have personally reviewed following labs and imaging studies  CBC: Recent Labs  Lab 01/06/18 1038 01/06/18 1604  WBC 5.5 5.7  NEUTROABS 3.5  --   HGB 12.4 12.7  HCT 37.5 40.1  MCV 95.6 97.1  PLT 153.0 517   Basic Metabolic Panel: Recent Labs  Lab 01/06/18 1038 01/06/18 1604  NA 140 142  K 3.6 3.8  CL 101 102  CO2 35* 30  GLUCOSE 80 68  BUN 30* 30*  CREATININE 0.72 0.93  CALCIUM 9.1 9.3  MG 1.8  --    GFR: Estimated Creatinine Clearance: 54.7 mL/min (by C-G formula based on SCr of 0.93 mg/dL). Liver Function Tests: Recent Labs  Lab 01/06/18 1038  AST 25  ALT 17  ALKPHOS 54  BILITOT 0.5  PROT 6.3  ALBUMIN 3.8   No results for input(s): LIPASE, AMYLASE in the last 168  hours. No results for input(s): AMMONIA in the last 168 hours. Coagulation Profile: No results for input(s): INR, PROTIME in the last 168 hours. Cardiac Enzymes: Recent Labs  Lab 01/06/18 1038  CKTOTAL 394*  CKMB 22.4*   BNP (last 3 results) No results for input(s): PROBNP in the last 8760 hours. HbA1C: No results for input(s): HGBA1C in the last 72 hours. CBG: No results for input(s): GLUCAP in the last 168 hours. Lipid Profile: No results for input(s): CHOL, HDL, LDLCALC, TRIG, CHOLHDL, LDLDIRECT in the last 72 hours. Thyroid Function Tests: Recent Labs    01/06/18 1038  TSH 1.88   Anemia Panel: No results for input(s): VITAMINB12, FOLATE, FERRITIN, TIBC, IRON, RETICCTPCT in the last 72 hours. Urine analysis:    Component Value Date/Time   COLORURINE YELLOW 02/28/2014 [place] 02/28/2014 1237   LABSPEC 1.021 02/28/2014 1237   PHURINE 5.5 02/28/2014 1237   GLUCOSEU NEG 02/28/2014 1237   HGBUR NEG 02/28/2014 1237   BILIRUBINUR negative 08/10/2017 1503   KETONESUR NEG 02/28/2014 1237   PROTEINUR trace 08/10/2017 1503   PROTEINUR NEG 02/28/2014 1237   UROBILINOGEN 0.2 08/10/2017 1503   UROBILINOGEN 0.2 02/28/2014 1237   NITRITE negative 08/10/2017 1503   NITRITE NEG 02/28/2014 1237   LEUKOCYTESUR Negative 08/10/2017 1503    Radiological Exams on Admission: Dg Chest 2 View  Result Date: 01/06/2018 CLINICAL DATA:  Chest wall tightness for several weeks, no known injury, initial encounter EXAM: CHEST - 2 VIEW COMPARISON:  None. FINDINGS: Extensive fixation hardware is noted throughout the thoracic spine. Mild residual scoliosis concave to the left is noted. Spinal stimulator is noted in the midthoracic spine. Lungs are well aerated bilaterally. No focal infiltrate or sizable effusion is seen. Postsurgical changes in the right shoulder are noted as well. IMPRESSION: No acute abnormality noted. Electronically Signed   By: Inez Catalina M.D.   On: 01/06/2018  16:12    EKG: Independently reviewed. Lateral t wave inversions resolved on  repeat ekg  Assessment/Plan Active Problems:   Arthritis   Hypothyroidism   Substernal chest pain   Chronic pain   Chronic, continuous use of opioids   Constipation due to opioid therapy    # Substernal chest pain - etiology unclear. Relatively low risk for acs (heart score 3). Reassuringly initial troponin negative and this after 2 wks of symptoms; ekg has, however, shown dynamic changes in lateral leads. That this substernal pressure is associated with some shortness of breath is mildly concerning for PE (no recent immobilization or sign of dvt, but due to chronic pain pt is not very mobile at baseline). CXR without acute findings; pain is non re-producible. Denies recent stressors. TSH wnl today - obs - tele - am ekg - cycle troponins - f/u d-dimer, if above age-adjusted cutoff, will order CTA (pt aware and agrees) - npo after 2 am in case of AM study  # Chronic pain - continue home robaxin, morphine, gabapentin  # insomnia - continue home diazepam, substitute ambien for eszopiclone  # Chronic constipation - continue home movantik; miralax prn  # Hypothyroidism - continue home levothyroxine   DVT prophylaxis: lovenox, scds Code Status: full  Family Communication: husband Dennison Bulla [phone] Disposition Plan: tbd  Consults called: none  Admission status: obs tele    Desma Maxim MD [facility] Pager [phone]  If 7PM-7AM, please contact night-coverage [email or web address] Password [facility]  01/06/2018, 7:41 PM

## 2018-01-06 NOTE — ED Provider Notes (Signed)
I have personally seen and examined the patient. I have reviewed the documentation on PMH/FH/Soc Hx. I have discussed the plan of care with the resident and patient.  I have reviewed and agree with the resident's documentation. Please see associated encounter note.  Briefly, the patient is a 72 y.o. female here with here with several weeks of atypical chest pain that has been gradually worsening. EKG with new ischemic changes. Trops negative. Low pretest prob for PE. Presentation not classic for dissection or esophageal perforation. Admitted for ACS r/o.   EKG Interpretation  Date/Time:  Friday January 06 2018 16:21:00 EDT Ventricular Rate:  83 PR Interval:  140 QRS Duration: 62 QT Interval:  642 QTC Calculation: 754 R Axis:   86 Text Interpretation:   Critical Test Result: Long QTc Normal sinus rhythm Septal infarct , age undetermined Abnormal ECG inferior/lateral ischemic changes compared to old. Reconfirmed by Addison Lank [phone]) on 01/06/2018 6:18:39 PM         Cardama, Grayce Sessions, MD 01/07/18 [phone]

## 2018-01-06 NOTE — Patient Instructions (Addendum)
I'm not sure what is causing  your chest tightness .  I have ordered labs,  Chest x ray and will consider a cardiology referral to rule out atrial fib given your concurrent palpitations  You may require pulmonary function tests to help determine if this is a mechanical problem or a lung problem

## 2018-01-06 NOTE — ED Triage Notes (Signed)
Pt complains of CP ongoing for weeks non radiating in nature. Pt went to MD today and was told to come here after some of her labs were elevated. Pt denies N/V, but endorses feeling lightheaded at times.

## 2018-01-06 NOTE — ED Notes (Signed)
Sent label to main lab to add on d-dimer.

## 2018-01-06 NOTE — ED Provider Notes (Signed)
[facility] EMERGENCY DEPARTMENT Provider Note   CSN: [id number] Arrival date & time: 01/06/18  1552     History   Chief Complaint Chief Complaint  Patient presents with  . Chest Pain    HPI Sonya Cordova is a 72 y.o. female.  The history is provided by the patient.  Chest Pain   This is a new problem. The current episode started more than 1 week ago. The problem occurs constantly. The problem has been gradually worsening. The pain is associated with exertion and rest. The pain is present in the substernal region. The pain is at a severity of 7/10. The pain is moderate. The quality of the pain is described as pressure-like. The pain does not radiate. Duration of episode(s) is 3 weeks. Associated symptoms include back pain (chronic), exertional chest pressure, headaches (chronic), lower extremity edema (unchanged) and shortness of breath (dyspnea on exertion). Pertinent negatives include no abdominal pain, no cough, no fever, no nausea, no near-syncope, no numbness, no palpitations, no syncope, no vomiting and no weakness.  Patient has had a few weeks of substernal chest tightness and pressure.  She first mentioned it to her chronic pain doctor who recommended she follow-up with her primary doctor for this.  Her PCP today to the EKG and labs and a chest x-ray.  She is called later today and was told that she had elevated cardiac enzymes and was told to go to the emergency department.  She states she is having 7/10 of chest pressure that is nonradiating.  She says today that she has had worsening exertional dyspnea.  Past Medical History:  Diagnosis Date  . Anemia   . Arthritis   . Cancer (HCC)    Basal cell  . Complication of anesthesia    "states remembers surgeon talking- was light"  . Connective tissue disease ([place])   . Contact lens/glasses fitting    wears contacts or glasses  . Degenerative disc disease 1995  . Edema of both legs   . Family history of  anesthesia complication   . Headache(784.0)    migraines  . History of blood transfusion   . Hypothyroidism   . Lymphedema   . Neuropathy   . Osteopenia 02/2012   t score -1.1  . PONV (postoperative nausea and vomiting)     Patient Active Problem List   Diagnosis Date Noted  . Chest tightness or pressure 01/06/2018  . Hoarseness or changing voice 08/13/2017  . Chronic sinusitis of both maxillary sinuses 06/16/2017  . Loss of balance 09/16/2016  . Carotid atherosclerosis, bilateral 09/16/2016  . Insomnia secondary to anxiety 09/04/2016  . Allergic rhinitis 07/06/2016  . 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 Medicare annual wellness visit, subsequent 01/19/2015  . TMJ (temporomandibular joint syndrome) 09/23/2013  . Polyarthritis 09/20/2013  . Hypothyroidism 09/20/2013  . Edema of both legs 11/01/2012  . Osteoarthritis (arthritis due to wear and tear of joints) 05/22/2012  . Degeneration of lumbar or lumbosacral intervertebral disc   . Arthritis   . Osteopenia     Past Surgical History:  Procedure Laterality Date  . abdominal plasty    . APPENDECTOMY    . BACK SURGERY     neck,, cervical, fusion 1995-2010 ( 17 surgeries)  . BREAST ENHANCEMENT SURGERY  1978  . EYE SURGERY     as a child  . HAMMER TOE SURGERY    . hemorrhoid    . KNEE ARTHROSCOPY    .  knee replace    . left elbow    . left wrist    . SPINAL CORD STIMULATOR IMPLANT  2011  . THYROIDECTOMY  1986  . TONSILLECTOMY AND ADENOIDECTOMY    . TOTAL KNEE ARTHROPLASTY     2 times right  . TOTAL KNEE ARTHROPLASTY  05/22/2012   Procedure: TOTAL KNEE ARTHROPLASTY;  Surgeon: Gearlean Alf, MD;  Location: [facility];  Service: Orthopedics;  Laterality: Left;  . TOTAL SHOULDER REPLACEMENT     2 times right shoulder  . TUBAL LIGATION    . ULNAR NERVE TRANSPOSITION Left 06/13/2013   Procedure: DECOMPRESSION, POSSIBLE TRANSPOSITION LEFT ULNAR NERVE;  Surgeon: Wynonia Sours, MD;   Location: [facility];  Service: Orthopedics;  Laterality: Left;  Marland Kitchen VAGINAL HYSTERECTOMY  1986     OB History    Gravida  2   Para  2   Term  2   Preterm      AB      Living  2     SAB      TAB      Ectopic      Multiple      Live Births               Home Medications    Prior to Admission medications   Medication Sig Start Date End Date Taking? Authorizing Provider  Cholecalciferol (VITAMIN D3) 1000 units CAPS Take 1 capsule by mouth daily.    [provider]  diazepam (VALIUM) 5 MG tablet TAKE 1 TABLET BY MOUTH AT BEDTIME AS NEEDED FOR ANXIETY 01/04/18   Crecencio Mc, MD  Eszopiclone (ESZOPICLONE) 3 MG TABS Take 3 mg by mouth at bedtime. Take immediately before bedtime. Name Brand ONLY    [provider]  gabapentin (NEURONTIN) 100 MG capsule Take 300 mg by mouth 3 (three) times daily.    [provider]  ibuprofen (ADVIL,MOTRIN) 600 MG tablet Take 600 mg by mouth as needed. 06/25/15   [provider]  ipratropium (ATROVENT) 0.06 % nasal spray USE TWO SPRAY(S) IN EACH NOSTRIL 4 TIMES DAILY 12/16/17   Crecencio Mc, MD  Lactobacillus (PROBIOTIC ACIDOPHILUS PO) Take 1 capsule by mouth daily.    [provider]  levothyroxine (SYNTHROID) 125 MCG tablet Take 1 tablet (125 mcg total) by mouth daily before breakfast. 10/17/17   Crecencio Mc, MD  methocarbamol (ROBAXIN) 500 MG tablet Take 1,000 mg by mouth Every 6 hours.  09/01/12   [provider]  mirabegron ER (MYRBETRIQ) 25 MG TB24 tablet Take 1 tablet (25 mg total) daily by mouth. Patient not taking: Reported on 01/06/2018 08/10/17   Crecencio Mc, MD  montelukast (SINGULAIR) 10 MG tablet TAKE ONE TABLET BY MOUTH AT BEDTIME 08/01/17   Crecencio Mc, MD  morphine (MSIR) 30 MG tablet Take 60 mg by mouth every 6 (six) hours as needed (takes 3xdaily). pain    [provider]  naloxegol oxalate (MOVANTIK) 25 MG TABS tablet Take 25 mg by  mouth daily as needed.    [provider]  polyethylene glycol (MIRALAX / GLYCOLAX) packet Take 17 g by mouth as needed. constipation    [provider]  Red Yeast Rice 600 MG CAPS Take 600 mg by mouth 2 (two) times daily.    [provider]  tiZANidine (ZANAFLEX) 4 MG tablet Take 1 tablet (4 mg total) by mouth every 6 (six) hours as needed for muscle spasms. 10/06/17  Crecencio Mc, MD  torsemide (DEMADEX) 5 MG tablet Take 1 tablet (5 mg total) by mouth daily as needed. 06/23/17   Crecencio Mc, MD    Family History Family History  Problem Relation Age of Onset  . Hypertension Sister   . Stroke Sister   . Heart disease Sister   . Thyroid disease Sister   . Hypertension Mother   . Heart disease Mother   . Pancreatic cancer Mother   . Skin cancer Mother   . Rheum arthritis Mother   . Hyperlipidemia Mother   . Hypertension Father   . Heart disease Father   . Skin cancer Father   . Osteoarthritis Father   . Hypertension Brother   . Osteoarthritis Brother   . Skin cancer Sister   . Osteoarthritis Sister   . Hypertension Sister     Social History Social History   Tobacco Use  . Smoking status: Former Smoker    Packs/day: 1.00    Years: 20.00    Pack years: 20.00    Types: Cigarettes    Last attempt to quit: 10/05/1987    Years since quitting: 30.2  . Smokeless tobacco: Never Used  Substance Use Topics  . Alcohol use: No    Alcohol/week: 0.0 oz  . Drug use: No     Allergies   Cleocin [clindamycin hcl]; Amoxicillin-pot clavulanate; and Codeine   Review of Systems Review of Systems  Constitutional: Negative for chills and fever.  HENT: Negative for ear pain and sore throat.   Eyes: Negative for pain and visual disturbance.  Respiratory: Positive for shortness of breath (dyspnea on exertion). Negative for cough.   Cardiovascular: Positive for chest pain and leg swelling. Negative for palpitations, syncope and near-syncope.    Gastrointestinal: Negative for abdominal pain, nausea and vomiting.  Genitourinary: Negative for dysuria and hematuria.  Musculoskeletal: Positive for back pain (chronic) and neck pain (chronic). Negative for arthralgias.  Skin: Negative for color change and rash.  Neurological: Positive for headaches (chronic). Negative for syncope, weakness, light-headedness and numbness.  All other systems reviewed and are negative.    Physical Exam Updated Vital Signs BP 126/66   Pulse 66   Temp 99.1 F (37.3 C) (Oral)   Resp 18   Ht 5\' 7"  (1.702 m)   Wt 61.7 kg (136 lb)   SpO2 100%   BMI 21.30 kg/m   Physical Exam  Constitutional: She is oriented to person, place, and time. She appears well-developed and well-nourished. She does not appear ill. No distress.  HENT:  Head: Normocephalic and atraumatic.  Eyes: Conjunctivae are normal.  Neck: Neck supple.  Cardiovascular: Normal rate and regular rhythm.  No murmur heard. Pulmonary/Chest: Effort normal and breath sounds normal. No respiratory distress. She has no decreased breath sounds. She has no wheezes. She has no rales.  Abdominal: Soft. There is no tenderness.  Musculoskeletal:       Right lower leg: She exhibits edema (1+).       Left lower leg: She exhibits edema (1+).  Neurological: She is alert and oriented to person, place, and time.  Skin: Skin is warm and dry.  Psychiatric: She has a normal mood and affect.  Nursing note and vitals reviewed.    ED Treatments / Results  Labs (all labs ordered are listed, but only abnormal results are displayed) Labs Reviewed  BASIC METABOLIC PANEL - Abnormal; Notable for the following components:      Result Value   BUN 30 (*)  All other components within normal limits  CBC  BRAIN NATRIURETIC PEPTIDE  I-STAT TROPONIN, ED  I-STAT TROPONIN, ED    EKG  Radiology Dg Chest 2 View  Result Date: 01/06/2018 CLINICAL DATA:  Chest wall tightness for several weeks, no known injury,  initial encounter EXAM: CHEST - 2 VIEW COMPARISON:  None. FINDINGS: Extensive fixation hardware is noted throughout the thoracic spine. Mild residual scoliosis concave to the left is noted. Spinal stimulator is noted in the midthoracic spine. Lungs are well aerated bilaterally. No focal infiltrate or sizable effusion is seen. Postsurgical changes in the right shoulder are noted as well. IMPRESSION: No acute abnormality noted. Electronically Signed   By: Inez Catalina M.D.   On: 01/06/2018 16:12    Procedures Procedures (including critical care time)  Medications Ordered in ED Medications  nitroGLYCERIN (NITROSTAT) SL tablet 0.4 mg (has no administration in time range)  aspirin chewable tablet 324 mg (has no administration in time range)     Initial Impression / Assessment and Plan / ED Course  I have reviewed the triage vital signs and the nursing notes.  Pertinent labs & imaging results that were available during my care of the patient were reviewed by me and considered in my medical decision making (see chart for details).     Patient is a 72 year old female with history of chronic neck and back pain, migraine headaches, arthritis, hypothyroidism, lymphedema who presents with 3 weeks of substernal chest pressure and tightness that has been worsening.  Patient sent to the ED by her PCP for abnormal CK-MB.  On exam patient appears anxious but in no acute distress.  Heart and lung sounds fine.  She has 1+ pitting edema bilaterally but she states this is normal for her.  She has equal peripheral pulses.  EKG initially shows inferior and lateral ST depressions which is new from prior.  Repeat EKG showed resolution of these findings concerning for dynamic changes.  Patient given nitroglycerin and aspirin.  Patient advised that she will need to stay for further cardiac workup.  Her first troponin here is negative.  Chest x-ray is unremarkable.  Low suspicion for PE, dissection, pneumonia at this  time.  Plan to obtain a BNP and delta troponin while in the ED as well.  Patient will be admitted for further ACS workup.   Final Clinical Impressions(s) / ED Diagnoses   Final diagnoses:  Unstable angina [facility])    ED Discharge Orders    None       Tobie Poet, DO 01/06/18 1859

## 2018-01-06 NOTE — Telephone Encounter (Signed)
Sonya Cordova with [employer] called with critical lab results.    She is faxing the results to [employer] now.  I called the flow coordinator at [employer] at [facility] where Dr. Derrel Nip is and made them aware of the critical CK total and CKMB and relative index lab results being elevated.   I let them know the results were visible in Epic and also the lab is  Faxing them to them now.

## 2018-01-06 NOTE — Progress Notes (Signed)
Per NP Baltazar Najjar give oxycodone. Patient refusing oxycodone at this time.  Sonya Faiola, RN

## 2018-01-07 ENCOUNTER — Observation Stay (HOSPITAL_COMMUNITY): Payer: Medicare Other

## 2018-01-07 ENCOUNTER — Other Ambulatory Visit (HOSPITAL_COMMUNITY): Payer: Self-pay

## 2018-01-07 ENCOUNTER — Other Ambulatory Visit: Payer: Self-pay

## 2018-01-07 DIAGNOSIS — K5909 Other constipation: Secondary | ICD-10-CM | POA: Diagnosis not present

## 2018-01-07 DIAGNOSIS — G4700 Insomnia, unspecified: Secondary | ICD-10-CM | POA: Diagnosis not present

## 2018-01-07 DIAGNOSIS — F1190 Opioid use, unspecified, uncomplicated: Secondary | ICD-10-CM

## 2018-01-07 DIAGNOSIS — K5903 Drug induced constipation: Secondary | ICD-10-CM

## 2018-01-07 DIAGNOSIS — Z79891 Long term (current) use of opiate analgesic: Secondary | ICD-10-CM | POA: Diagnosis not present

## 2018-01-07 DIAGNOSIS — R490 Dysphonia: Secondary | ICD-10-CM | POA: Diagnosis not present

## 2018-01-07 DIAGNOSIS — R2681 Unsteadiness on feet: Secondary | ICD-10-CM | POA: Diagnosis not present

## 2018-01-07 DIAGNOSIS — I6523 Occlusion and stenosis of bilateral carotid arteries: Secondary | ICD-10-CM | POA: Diagnosis not present

## 2018-01-07 DIAGNOSIS — T402X5A Adverse effect of other opioids, initial encounter: Secondary | ICD-10-CM | POA: Diagnosis not present

## 2018-01-07 DIAGNOSIS — M419 Scoliosis, unspecified: Secondary | ICD-10-CM | POA: Diagnosis not present

## 2018-01-07 DIAGNOSIS — G8929 Other chronic pain: Secondary | ICD-10-CM | POA: Diagnosis not present

## 2018-01-07 DIAGNOSIS — R079 Chest pain, unspecified: Secondary | ICD-10-CM | POA: Diagnosis not present

## 2018-01-07 DIAGNOSIS — R072 Precordial pain: Secondary | ICD-10-CM | POA: Diagnosis not present

## 2018-01-07 DIAGNOSIS — E039 Hypothyroidism, unspecified: Secondary | ICD-10-CM

## 2018-01-07 DIAGNOSIS — M1990 Unspecified osteoarthritis, unspecified site: Secondary | ICD-10-CM | POA: Diagnosis not present

## 2018-01-07 DIAGNOSIS — R0789 Other chest pain: Secondary | ICD-10-CM | POA: Diagnosis not present

## 2018-01-07 LAB — TROPONIN I
Troponin I: <0.03 ng/mL (ref <0.03)
Troponin I: <0.03 ng/mL (ref <0.03)

## 2018-01-07 MED ORDER — LORATADINE 10 MG PO TABS
10.0000 mg | ORAL_TABLET | Freq: Every day | ORAL | Status: DC
  Administered 2018-01-08 – 2018-01-09 (×2): 10 mg via ORAL
  Filled 2018-01-07 (×3): qty 1

## 2018-01-07 MED ORDER — ASPIRIN 325 MG PO TABS
325.0000 mg | ORAL_TABLET | Freq: Every day | ORAL | Status: DC
  Administered 2018-01-07 – 2018-01-09 (×3): 325 mg via ORAL
  Filled 2018-01-07 (×4): qty 1

## 2018-01-07 MED ORDER — NON FORMULARY: 3.0000 mg | Freq: Every day | Status: DC

## 2018-01-07 MED ORDER — AZELASTINE HCL 0.1 % NA SOLN
1.0000 | Freq: Two times a day (BID) | NASAL | Status: DC
  Administered 2018-01-07 – 2018-01-09 (×5): 1 via NASAL
  Filled 2018-01-07 (×2): qty 30

## 2018-01-07 MED ORDER — IOPAMIDOL (ISOVUE-370) INJECTION 76%
100.0000 mL | Freq: Once | INTRAVENOUS | Status: AC | PRN
  Administered 2018-01-07: 100 mL via INTRAVENOUS

## 2018-01-07 MED ORDER — METHOCARBAMOL 500 MG PO TABS
750.0000 mg | ORAL_TABLET | Freq: Four times a day (QID) | ORAL | Status: DC | PRN
  Administered 2018-01-07: 750 mg via ORAL
  Administered 2018-01-07 – 2018-01-08 (×2): 1500 mg via ORAL
  Filled 2018-01-07: qty 3
  Filled 2018-01-07: qty 2
  Filled 2018-01-07: qty 3

## 2018-01-07 MED ORDER — METHOCARBAMOL 500 MG PO TABS
2250.0000 mg | ORAL_TABLET | Freq: Every day | ORAL | Status: DC
  Administered 2018-01-07 – 2018-01-08 (×2): 2250 mg via ORAL
  Filled 2018-01-07 (×2): qty 5

## 2018-01-07 MED ORDER — ESZOPICLONE 1 MG PO TABS
3.0000 mg | ORAL_TABLET | Freq: Every day | ORAL | Status: DC
  Administered 2018-01-07 – 2018-01-08 (×2): 3 mg via ORAL
  Filled 2018-01-07 (×2): qty 3

## 2018-01-07 MED ORDER — NITROGLYCERIN 0.4 MG SL SUBL: 0.4000 mg | SUBLINGUAL_TABLET | SUBLINGUAL | Status: DC | PRN

## 2018-01-07 MED ORDER — METHOCARBAMOL 500 MG PO TABS: 750.0000 mg | ORAL_TABLET | ORAL | Status: DC

## 2018-01-07 MED ORDER — MELATONIN 3 MG PO TABS
18.0000 mg | ORAL_TABLET | Freq: Every day | ORAL | Status: DC
  Administered 2018-01-07 – 2018-01-09 (×2): 18 mg via ORAL
  Filled 2018-01-07 (×3): qty 6

## 2018-01-07 MED ORDER — GABAPENTIN 300 MG PO CAPS
600.0000 mg | ORAL_CAPSULE | Freq: Three times a day (TID) | ORAL | Status: DC
  Administered 2018-01-07 – 2018-01-09 (×8): 600 mg via ORAL
  Filled 2018-01-07 (×4): qty 2
  Filled 2018-01-07: qty 6
  Filled 2018-01-07 (×3): qty 2

## 2018-01-07 NOTE — Consult Note (Signed)
Cardiology Consultation:   Patient ID: ARISTEA POSADA; [id number]; [birth year]   Admit date: 01/06/2018 Date of Consult: 01/07/2018  Primary Care Provider: Crecencio Mc, MD Primary Cardiologist: Dr Stanford Breed    Patient Profile:   Sonya Cordova is a 72 y.o. female with a hx of chronic back pain, hyperlipidemia who is being seen today for the evaluation of chest pain at the request of Dr Algis Liming.  History of Present Illness:   No prior cardiac history.  Patient states that for the past 3 weeks she has had chest tightness.  The pain is worse at times for several minutes then improves.  It never completely resolves with only mild pain.  She has had pain in the hospital relieved with nitroglycerin.  There is no associated symptoms.  It does not radiate.  It is not pleuritic or positional.  She was seen by her primary care physician and CK-MB was ordered which was elevated.  Total CK 394 with MB 22.4.  Troponins are normal.  Cardiology now asked to evaluate.  Note she has some chronic dyspnea on exertion that she attributes to deconditioning.  She denies orthopnea or PND.  Chronic mild pedal edema.  Past Medical History:  Diagnosis Date  . Anemia   . Arthritis   . Cancer (HCC)    Basal cell  . Complication of anesthesia    "states remembers surgeon talking- was light"  . Connective tissue disease ([place])   . Contact lens/glasses fitting    wears contacts or glasses  . Degenerative disc disease 1995  . Edema of both legs   . Family history of anesthesia complication   . Headache(784.0)    migraines  . History of blood transfusion   . Hypothyroidism   . Lymphedema   . Neuropathy   . Osteopenia 02/2012   t score -1.1  . PONV (postoperative nausea and vomiting)     Past Surgical History:  Procedure Laterality Date  . abdominal plasty    . APPENDECTOMY    . BACK SURGERY     neck,, cervical, fusion 1995-2010 ( 17 surgeries)  . BREAST ENHANCEMENT SURGERY  1978  . EYE SURGERY      as a child  . HAMMER TOE SURGERY    . hemorrhoid    . KNEE ARTHROSCOPY    . knee replace    . left elbow    . left wrist    . SPINAL CORD STIMULATOR IMPLANT  2011  . THYROIDECTOMY  1986  . TONSILLECTOMY AND ADENOIDECTOMY    . TOTAL KNEE ARTHROPLASTY     2 times right  . TOTAL KNEE ARTHROPLASTY  05/22/2012   Procedure: TOTAL KNEE ARTHROPLASTY;  Surgeon: Gearlean Alf, MD;  Location: [facility];  Service: Orthopedics;  Laterality: Left;  . TOTAL SHOULDER REPLACEMENT     2 times right shoulder  . TUBAL LIGATION    . ULNAR NERVE TRANSPOSITION Left 06/13/2013   Procedure: DECOMPRESSION, POSSIBLE TRANSPOSITION LEFT ULNAR NERVE;  Surgeon: Wynonia Sours, MD;  Location: [place] [place];  Service: Orthopedics;  Laterality: Left;  Marland Kitchen VAGINAL HYSTERECTOMY  1986       Inpatient Medications: Scheduled Meds: . azelastine  1 spray Each Nare BID  . diazepam  5 mg Oral QHS  . enoxaparin (LOVENOX) injection  40 mg Subcutaneous Q24H  . gabapentin  600 mg Oral TID  . iopamidol      . ipratropium  1 spray Each Nare QID  . levothyroxine  125 mcg Oral QAC breakfast  . loratadine  10 mg Oral Daily  . Melatonin  18 mg Oral QHS  . methocarbamol  2,250 mg Oral QHS  . montelukast  10 mg Oral QHS  . morphine  30 mg Oral BID PC  . naloxegol oxalate  25 mg Oral Daily  . NON FORMULARY 3 mg  3 mg Oral QHS   Continuous Infusions:  PRN Meds: acetaminophen, methocarbamol, nitroGLYCERIN, ondansetron (ZOFRAN) IV, oxyCODONE, polyethylene glycol  Allergies:    Allergies  Allergen Reactions  . Cleocin [Clindamycin Hcl] Anaphylaxis  . Amoxicillin-Pot Clavulanate Hives    Reaction to augmentin Has patient had a PCN reaction causing immediate rash, facial/tongue/throat swelling, SOB or lightheadedness with hypotension: Yes Has patient had a PCN reaction causing severe rash involving mucus membranes or skin necrosis: No Has patient had a PCN reaction that required hospitalization: No Has patient  had a PCN reaction occurring within the last 10 years: No If all of the above answers are "NO", then may proceed with Cephalosporin use.  . Codeine Nausea And Vomiting    Social History:   Social History   Socioeconomic History  . Marital status: Married    Spouse name: Not on file  . Number of children: Not on file  . Years of education: Not on file  . Highest education level: Not on file  Occupational History  . Occupation: Therapist, sports- Disabled  Social Needs  . Financial resource strain: Not on file  . Food insecurity:    Worry: Not on file    Inability: Not on file  . Transportation needs:    Medical: Not on file    Non-medical: Not on file  Tobacco Use  . Smoking status: Former Smoker    Packs/day: 1.00    Years: 20.00    Pack years: 20.00    Types: Cigarettes    Last attempt to quit: 10/05/1987    Years since quitting: 30.2  . Smokeless tobacco: Never Used  Substance and Sexual Activity  . Alcohol use: No    Alcohol/week: 0.0 oz  . Drug use: No  . Sexual activity: Yes    Partners: Male    Birth control/protection: Post-menopausal  Lifestyle  . Physical activity:    Days per week: Not on file    Minutes per session: Not on file  . Stress: Not on file  Relationships  . Social connections:    Talks on phone: Not on file    Gets together: Not on file    Attends religious service: Not on file    Active member of club or organization: Not on file    Attends meetings of clubs or organizations: Not on file    Relationship status: Not on file  . Intimate partner violence:    Fear of current or ex partner: Not on file    Emotionally abused: Not on file    Physically abused: Not on file    Forced sexual activity: Not on file  Other Topics Concern  . Not on file  Social History Narrative  . Not on file    Family History:    Family History  Problem Relation Age of Onset  . Hypertension Sister   . Stroke Sister   . Heart disease Sister   . Thyroid disease Sister     . Hypertension Mother   . Heart disease Mother   . Pancreatic cancer Mother   . Skin cancer Mother   . Rheum arthritis Mother   .  Hyperlipidemia Mother   . Hypertension Father   . Heart disease Father   . Skin cancer Father   . Osteoarthritis Father   . Hypertension Brother   . Osteoarthritis Brother   . Skin cancer Sister   . Osteoarthritis Sister   . Hypertension Sister      ROS:  Please see the history of present illness.  Patient has chronic back pain and arthralgias.  She denies fevers, chills, productive cough or hemoptysis. All other ROS reviewed and negative.     Physical Exam/Data:   Vitals:   01/06/18 2320 01/07/18 0500 01/07/18 0558 01/07/18 0955  BP: 113/64  (!) 119/51 132/66  Pulse: 62  (!) 55 60  Resp:   18   Temp:   98 F (36.7 C)   TempSrc:   Oral   SpO2:   95%   Weight:  137 lb 14.4 oz (62.6 kg)    Height:        Intake/Output Summary (Last 24 hours) at 01/07/2018 1034 Last data filed at 01/07/2018 0900 Gross per 24 hour  Intake 240 ml  Output 1650 ml  Net -1410 ml   Filed Weights   01/06/18 1603 01/06/18 2106 01/07/18 0500  Weight: 136 lb (61.7 kg) 138 lb 3.2 oz (62.7 kg) 137 lb 14.4 oz (62.6 kg)   Body mass index is 21.6 kg/m.  General:  Well nourished, well developed, in no acute distress HEENT: normal Neck: no JVD Vascular: No carotid bruits; FA pulses 2+ bilaterally without bruits  Cardiac:  normal S1, S2; RRR; no murmur  Lungs:  clear to auscultation bilaterally, no wheezing, rhonchi or rales  Abd: soft, nontender, no hepatomegaly  Ext: trace edema Musculoskeletal:  No deformities, BUE and BLE strength normal and equal Skin: warm and dry  Neuro:  CNs 2-12 intact, no focal abnormalities noted Psych:  Normal affect   EKG:  The EKG was personally reviewed and demonstrates: Normal sinus rhythm with inferior lateral slight ST depression.  Follow-up electrocardiogram shows sinus rhythm with no ST changes. Telemetry:  Telemetry was  personally reviewed and demonstrates:  NSR   Laboratory Data:  Chemistry Recent Labs  Lab 01/06/18 1038 01/06/18 1604  NA 140 142  K 3.6 3.8  CL 101 102  CO2 35* 30  GLUCOSE 80 68  BUN 30* 30*  CREATININE 0.72 0.93  CALCIUM 9.1 9.3  GFRNONAA  --  >60  GFRAA  --  >60  ANIONGAP  --  10    Recent Labs  Lab 01/06/18 1038  PROT 6.3  ALBUMIN 3.8  AST 25  ALT 17  ALKPHOS 54  BILITOT 0.5   Hematology Recent Labs  Lab 01/06/18 1038 01/06/18 1604  WBC 5.5 5.7  RBC 3.93 4.13  HGB 12.4 12.7  HCT 37.5 40.1  MCV 95.6 97.1  MCH  --  30.8  MCHC 33.2 31.7  RDW 13.3 12.9  PLT 153.0 161   Cardiac Enzymes Recent Labs  Lab 01/07/18 0023 01/07/18 0600  TROPONINI <0.03 <0.03    Recent Labs  Lab 01/06/18 1629 01/06/18 1916  TROPIPOC 0.00 0.01    BNP Recent Labs  Lab 01/06/18 1902  BNP 43.1    DDimer  Recent Labs  Lab 01/06/18 2108  DDIMER 3.25*    Radiology/Studies:  Dg Chest 2 View  Result Date: 01/06/2018 CLINICAL DATA:  Chest wall tightness for several weeks, no known injury, initial encounter EXAM: CHEST - 2 VIEW COMPARISON:  None. FINDINGS: Extensive fixation hardware is  noted throughout the thoracic spine. Mild residual scoliosis concave to the left is noted. Spinal stimulator is noted in the midthoracic spine. Lungs are well aerated bilaterally. No focal infiltrate or sizable effusion is seen. Postsurgical changes in the right shoulder are noted as well. IMPRESSION: No acute abnormality noted. Electronically Signed   By: Inez Catalina M.D.   On: 01/06/2018 16:12   Ct Angio Chest Pe W Or Wo Contrast  Result Date: 01/07/2018 CLINICAL DATA:  Chest tightness beginning tonight. EXAM: CT ANGIOGRAPHY CHEST WITH CONTRAST TECHNIQUE: Multidetector CT imaging of the chest was performed using the standard protocol during bolus administration of intravenous contrast. Multiplanar CT image reconstructions and MIPs were obtained to evaluate the vascular anatomy. CONTRAST:   169mL ISOVUE-370 IOPAMIDOL (ISOVUE-370) INJECTION 76% COMPARISON:  07/29/2015 FINDINGS: Cardiovascular: Satisfactory opacification of the pulmonary arteries to the segmental level. No evidence of pulmonary embolism. Normal heart size. No pericardial effusion. Aortic calcification. Mediastinum/Nodes: No enlarged mediastinal, hilar, or axillary lymph nodes. Thyroid gland, trachea, and esophagus demonstrate no significant findings. Bilateral breast implants. Lungs/Pleura: Emphysematous changes in the lungs. No airspace disease or consolidation. No pleural effusions. No pneumothorax. Upper Abdomen: No acute abnormality. Musculoskeletal: Postoperative changes with posterior rod and screw fixation throughout the thoracic spine. Artifact from hardware limits evaluation in some areas. Normal alignment of the thoracic spine. No destructive bone lesions. Review of the MIP images confirms the above findings. IMPRESSION: 1. No evidence of pulmonary embolus. 2. Emphysematous changes in the lungs. No evidence of active pulmonary disease. 3. Aortic atherosclerosis. Aortic Atherosclerosis (ICD10-I70.0) and Emphysema (ICD10-J43.9). Electronically Signed   By: Lucienne Capers M.D.   On: 01/07/2018 00:44    Assessment and stress nuclear study.:   1. Chest pain-symptoms are atypical.  They have been continuous for 3 weeks but she states worse at times with only minor symptoms at others.  Not clearly exertional.  However her initial electrocardiogram showed some degree of inferolateral ST depression.  Her CK-MB was elevated but troponin is normal.  Troponin should be more sensitive and specific.  We will check echocardiogram for wall motion.  If abnormal would likely need catheterization.  If normal would plan stress nuclear study.  Continue aspirin.  2. Chronic back pain-continue pain medications.   For questions or updates, please contact [place] Please consult [email or web address] for contact info under Cardiology/STEMI.    Signed, Kirk Ruths, MD  01/07/2018 10:34 AM

## 2018-01-07 NOTE — Progress Notes (Signed)
PROGRESS NOTE   Sonya Cordova  HER:[id number]    DOB: [birth year]    DOA: 01/06/2018  PCP: Crecencio Mc, MD   I have briefly reviewed patients previous medical records in [facility].  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 Nicholaus Bloom, pain MD), hypothyroid, presented to ED on 01/06/18 with complaints of chest tightness/discomfort.  Admitted for observation and further evaluation.  Cardiology consulted.   Assessment & Plan:   Active Problems:   Arthritis   Hypothyroidism   Substernal chest pain   Chronic pain   Chronic, continuous use of opioids   Constipation due to opioid therapy   Chest pain: Present for couple of weeks but patient not exactly certain.  Has mostly atypical and some typical characteristics (responded to NTG in the hospital).  She mentioned this to her pain MD following which she was seen by her PCP on day of admission, OP EKG reportedly with some abnormal findings, and elevated CK 394 with MB 22.4 and advised to come to ED for evaluation.  Since admission troponins x 2: Neg. EKG 4/6 10:14 AM: Low voltage, sinus rhythm, Q waves in leads V1-2 but no acute changes.  Since admission, had 2 episodes of chest pain that responded well to sublingual NTG.  Consulted cardiology, discussed with Dr. Stanford Breed: Plan to check TTE for wall motion, if abnormal would likely need cardiac catheterization but if normal would plan for nuclear stress test.  Currently not on aspirin, will add.  CTA chest negative for PE.  Chronic pain: Patient insisted on being on her home regimen.  Reviewed home regimen with RN and restarted majority of them.  Insomnia: Continue home medications.  Patient insists on continuing Eszopiclone-may continue home regimen.  Chronic constipation: Secondary to opioids.  Continue Movantik and MiraLAX.  Hypothyroid: Clinically euthyroid.  Continue Synthroid.   DVT prophylaxis: Lovenox Code  Status: Full Family Communication: None at bedside Disposition: DC home pending clinical improvement and evaluation.   Consultants:  Cardiology  Procedures:  None  Antimicrobials:  None   Subjective: Patient states that she was seen by her pain MD last week when she told him that she had been having lower midsternal chest tightness for a couple of weeks and he advised her to follow-up with her PCP which she did on evening of hospital admission where she was noted to have some EKG changes and abnormal labs and was advised to come to ED.  Chest discomfort is reported as tightness, can occur with rest or with activity, nonradiating, not associated with dyspnea, nausea or sweating, had to episodes since hospital admission and improved with sublingual NTG.  Chest discomfort has been intermittent since start.  ROS: As above.  Objective:  Vitals:   01/07/18 0500 01/07/18 0558 01/07/18 0955 01/07/18 1146  BP:  (!) 119/51 132/66 124/66  Pulse:  (!) 55 60 69  Resp:  18  20  Temp:  98 F (36.7 C)  97.9 F (36.6 C)  TempSrc:  Oral  Oral  SpO2:  95%  96%  Weight: 62.6 kg (137 lb 14.4 oz)     Height:        Examination:  General exam: Pleasant elderly female, moderately built and nourished, lying comfortably supine in bed. Respiratory system: Clear to auscultation. Respiratory effort normal.  CP nonreproducible to palpation. Cardiovascular system: S1 & S2 heard, RRR. No JVD, murmurs, rubs, gallops or clicks. No pedal edema.  Telemetry personally  reviewed: SB in the 50s-SR. Gastrointestinal system: Abdomen is nondistended, soft and nontender. No organomegaly or masses felt. Normal bowel sounds heard. Central nervous system: Alert and oriented. No focal neurological deficits. Extremities: Symmetric 5 x 5 power. Skin: No rashes, lesions or ulcers Psychiatry: Judgement and insight appear normal. Mood & affect appropriate.     Data Reviewed: I have personally reviewed following labs and  imaging studies  CBC: Recent Labs  Lab 01/06/18 1038 01/06/18 1604  WBC 5.5 5.7  NEUTROABS 3.5  --   HGB 12.4 12.7  HCT 37.5 40.1  MCV 95.6 97.1  PLT 153.0 315   Basic Metabolic Panel: Recent Labs  Lab 01/06/18 1038 01/06/18 1604  NA 140 142  K 3.6 3.8  CL 101 102  CO2 35* 30  GLUCOSE 80 68  BUN 30* 30*  CREATININE 0.72 0.93  CALCIUM 9.1 9.3  MG 1.8  --    Liver Function Tests: Recent Labs  Lab 01/06/18 1038  AST 25  ALT 17  ALKPHOS 54  BILITOT 0.5  PROT 6.3  ALBUMIN 3.8   Cardiac Enzymes: Recent Labs  Lab 01/06/18 1038 01/07/18 0023 01/07/18 0600  CKTOTAL 394*  --   --   CKMB 22.4*  --   --   TROPONINI  --  <0.03 <0.03         Radiology Studies: Dg Chest 2 View  Result Date: 01/06/2018 CLINICAL DATA:  Chest wall tightness for several weeks, no known injury, initial encounter EXAM: CHEST - 2 VIEW COMPARISON:  None. FINDINGS: Extensive fixation hardware is noted throughout the thoracic spine. Mild residual scoliosis concave to the left is noted. Spinal stimulator is noted in the midthoracic spine. Lungs are well aerated bilaterally. No focal infiltrate or sizable effusion is seen. Postsurgical changes in the right shoulder are noted as well. IMPRESSION: No acute abnormality noted. Electronically Signed   By: Inez Catalina M.D.   On: 01/06/2018 16:12   Ct Angio Chest Pe W Or Wo Contrast  Result Date: 01/07/2018 CLINICAL DATA:  Chest tightness beginning tonight. EXAM: CT ANGIOGRAPHY CHEST WITH CONTRAST TECHNIQUE: Multidetector CT imaging of the chest was performed using the standard protocol during bolus administration of intravenous contrast. Multiplanar CT image reconstructions and MIPs were obtained to evaluate the vascular anatomy. CONTRAST:  135mL ISOVUE-370 IOPAMIDOL (ISOVUE-370) INJECTION 76% COMPARISON:  07/29/2015 FINDINGS: Cardiovascular: Satisfactory opacification of the pulmonary arteries to the segmental level. No evidence of pulmonary embolism.  Normal heart size. No pericardial effusion. Aortic calcification. Mediastinum/Nodes: No enlarged mediastinal, hilar, or axillary lymph nodes. Thyroid gland, trachea, and esophagus demonstrate no significant findings. Bilateral breast implants. Lungs/Pleura: Emphysematous changes in the lungs. No airspace disease or consolidation. No pleural effusions. No pneumothorax. Upper Abdomen: No acute abnormality. Musculoskeletal: Postoperative changes with posterior rod and screw fixation throughout the thoracic spine. Artifact from hardware limits evaluation in some areas. Normal alignment of the thoracic spine. No destructive bone lesions. Review of the MIP images confirms the above findings. IMPRESSION: 1. No evidence of pulmonary embolus. 2. Emphysematous changes in the lungs. No evidence of active pulmonary disease. 3. Aortic atherosclerosis. Aortic Atherosclerosis (ICD10-I70.0) and Emphysema (ICD10-J43.9). Electronically Signed   By: Lucienne Capers M.D.   On: 01/07/2018 00:44        Scheduled Meds: . azelastine  1 spray Each Nare BID  . diazepam  5 mg Oral QHS  . enoxaparin (LOVENOX) injection  40 mg Subcutaneous Q24H  . gabapentin  600 mg Oral TID  . ipratropium  1 spray Each Nare QID  . levothyroxine  125 mcg Oral QAC breakfast  . loratadine  10 mg Oral Daily  . Melatonin  18 mg Oral QHS  . methocarbamol  2,250 mg Oral QHS  . montelukast  10 mg Oral QHS  . morphine  30 mg Oral BID PC  . naloxegol oxalate  25 mg Oral Daily  . NON FORMULARY 3 mg  3 mg Oral QHS   Continuous Infusions:   LOS: 0 days     Vernell Leep, MD, FACP, [facility]. [facility] Pager [phone]  If 7PM-7AM, please contact night-coverage [email or web address] Password [email or web address] 01/07/2018, 1:11 PM

## 2018-01-07 NOTE — Progress Notes (Signed)
Patient stated this morning again that she feels chest tightness. EKG done,shows SB, VSS, patient stated she does not want Nitroglycerin, or any pain medication at this time.Patient kept NPO after midnight for possible procedure.  Will continue to monitor.  Mikhala Kenan, RN

## 2018-01-07 NOTE — Care Management Obs Status (Signed)
[facility] NOTIFICATION   Patient Details  Name: [name] MRN: [id number] Date of Birth: 1946/05/02   Medicare Observation Status Notification Given:  Yes(Pt not willing to sign form)    Bethena Roys, RN 01/07/2018, 3:59 PM

## 2018-01-07 NOTE — Progress Notes (Signed)
Update: d- dimer elevated; cta obtained - negative

## 2018-01-07 NOTE — Progress Notes (Signed)
Patient c/o chest pain # 6 and midsterna.  Denies nausea and radiation of pain other than chest.  Skin warm and dry.    Emergency Standing orders initiated and stat EKG in progress. B/P and pulse obtained and ntg 0.4 sl given.

## 2018-01-07 NOTE — Care Management (Addendum)
1559 01-07-18 CM did speak with patient in regards to status. Pt is aware that she is Observation Status and aware that she may have to pay out of pocket for po medications once bill is processed. CM did speak with Staff RN Butch Penny and she placed a call to echo- unable to do @ 1530 2/2 closing at 4:00 pm. Pt will be on list for 01-08-18. Pt is willing to stay overnight to see result of echo- if no intervention is needed patient would like to transition home as soon as possible 2/2 status. Bethena Roys. RN,BSN [phone]

## 2018-01-07 NOTE — Progress Notes (Signed)
Patient stated chest pain relieved after 1 ntg sl.

## 2018-01-07 NOTE — Progress Notes (Signed)
Patient back from CT denies pain, resting comfortably.  Will continue to monitor.  Ahmon Tosi, RN

## 2018-01-08 ENCOUNTER — Observation Stay (HOSPITAL_BASED_OUTPATIENT_CLINIC_OR_DEPARTMENT_OTHER): Payer: Medicare Other

## 2018-01-08 DIAGNOSIS — R072 Precordial pain: Secondary | ICD-10-CM | POA: Diagnosis not present

## 2018-01-08 DIAGNOSIS — T402X5A Adverse effect of other opioids, initial encounter: Secondary | ICD-10-CM | POA: Diagnosis not present

## 2018-01-08 DIAGNOSIS — K5903 Drug induced constipation: Secondary | ICD-10-CM | POA: Diagnosis not present

## 2018-01-08 DIAGNOSIS — E039 Hypothyroidism, unspecified: Secondary | ICD-10-CM | POA: Diagnosis not present

## 2018-01-08 DIAGNOSIS — I5030 Unspecified diastolic (congestive) heart failure: Secondary | ICD-10-CM | POA: Diagnosis not present

## 2018-01-08 LAB — ECHOCARDIOGRAM COMPLETE
Height: 67 in
Weight: 2152 oz

## 2018-01-08 NOTE — Progress Notes (Signed)
PROGRESS NOTE   Sonya Cordova  ZOX:[id number]    DOB: Jun 20, 1946    DOA: 01/06/2018  PCP: Crecencio Mc, MD   I have briefly reviewed patients previous medical records in [facility].  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 Nicholaus Bloom, pain MD), hypothyroid, presented to ED on 01/06/18 with complaints of chest tightness/discomfort.  Admitted for observation and further evaluation.  Cardiology consulted.   Assessment & Plan:   Active Problems:   Arthritis   Hypothyroidism   Substernal chest pain   Chronic pain   Chronic, continuous use of opioids   Constipation due to opioid therapy   Chest pain: Present for couple of weeks but patient not exactly certain.  Has mostly atypical and some typical characteristics (responded to NTG in the hospital).  She mentioned this to her pain MD following which she was seen by her PCP on day of admission, OP EKG reportedly with some abnormal findings, and elevated CK 394 with MB 22.4 and advised to come to ED for evaluation.  Since admission troponins x 2: Neg. EKG 4/6 10:14 AM: Low voltage, sinus rhythm, Q waves in leads V1-2 but no acute changes.  Since admission, had 2 episodes of chest pain that responded well to sublingual NTG.  Consulted cardiology, discussed with Dr. Stanford Breed: Plan to check TTE for wall motion, if abnormal would likely need cardiac catheterization but if normal would plan for nuclear stress test.  Continue aspirin.  CTA chest negative for PE.  No recurrence of chest pain since morning of 4/6.  Awaiting TTE, discussed with RN to try and get ASAP.  Discussed with Dr. Stanford Breed, if stress test needed, likely for 4/8 a.m.  Chronic pain: Continue current regimen.  Controlled.  Insomnia: Continue home medications.    Chronic constipation: Secondary to opioids.  Continue Movantik and MiraLAX.  Hypothyroid: Clinically euthyroid.  Continue Synthroid.   DVT  prophylaxis: Lovenox Code Status: Full Family Communication: None at bedside Disposition: DC home pending further evaluation as per cardiology.   Consultants:  Cardiology  Procedures:  None  Antimicrobials:  None   Subjective: No recurrence of chest pain since morning of 4/6.  Denies any other complaints.  As per RN, no acute issues noted.  ROS: As above.  Objective:  Vitals:   01/07/18 1146 01/07/18 1949 01/08/18 0611 01/08/18 0900  BP: 124/66 (!) 112/57 (!) 127/58 (!) 116/55  Pulse: 69 69 68 69  Resp: 20 16 16 18   Temp: 97.9 F (36.6 C) 98.3 F (36.8 C) 98.1 F (36.7 C) 97.7 F (36.5 C)  TempSrc: Oral Oral Oral Oral  SpO2: 96% 96% 99% 98%  Weight:   61 kg (134 lb 8 oz)   Height:        Examination:  General exam: Pleasant elderly female, moderately built and nourished, lying comfortably supine in bed. Respiratory system: Clear to auscultation. Respiratory effort normal.  CP nonreproducible to palpation.  Stable. Cardiovascular system: S1 & S2 heard, RRR. No JVD, murmurs, rubs, gallops or clicks. No pedal edema.  Telemetry personally reviewed: Sinus rhythm. Gastrointestinal system: Abdomen is nondistended, soft and nontender. No organomegaly or masses felt. Normal bowel sounds heard. Central nervous system: Alert and oriented. No focal neurological deficits. Extremities: Symmetric 5 x 5 power. Skin: No rashes, lesions or ulcers Psychiatry: Judgement and insight appear normal. Mood & affect appropriate.     Data Reviewed: I have personally reviewed following labs and imaging  studies  CBC: Recent Labs  Lab 01/06/18 1038 01/06/18 1604  WBC 5.5 5.7  NEUTROABS 3.5  --   HGB 12.4 12.7  HCT 37.5 40.1  MCV 95.6 97.1  PLT 153.0 272   Basic Metabolic Panel: Recent Labs  Lab 01/06/18 1038 01/06/18 1604  NA 140 142  K 3.6 3.8  CL 101 102  CO2 35* 30  GLUCOSE 80 68  BUN 30* 30*  CREATININE 0.72 0.93  CALCIUM 9.1 9.3  MG 1.8  --    Liver Function  Tests: Recent Labs  Lab 01/06/18 1038  AST 25  ALT 17  ALKPHOS 54  BILITOT 0.5  PROT 6.3  ALBUMIN 3.8   Cardiac Enzymes: Recent Labs  Lab 01/06/18 1038 01/07/18 0023 01/07/18 0600  CKTOTAL 394*  --   --   CKMB 22.4*  --   --   TROPONINI  --  <0.03 <0.03         Radiology Studies: Ct Angio Chest Pe W Or Wo Contrast  Result Date: 01/07/2018 CLINICAL DATA:  Chest tightness beginning tonight. EXAM: CT ANGIOGRAPHY CHEST WITH CONTRAST TECHNIQUE: Multidetector CT imaging of the chest was performed using the standard protocol during bolus administration of intravenous contrast. Multiplanar CT image reconstructions and MIPs were obtained to evaluate the vascular anatomy. CONTRAST:  163mL ISOVUE-370 IOPAMIDOL (ISOVUE-370) INJECTION 76% COMPARISON:  07/29/2015 FINDINGS: Cardiovascular: Satisfactory opacification of the pulmonary arteries to the segmental level. No evidence of pulmonary embolism. Normal heart size. No pericardial effusion. Aortic calcification. Mediastinum/Nodes: No enlarged mediastinal, hilar, or axillary lymph nodes. Thyroid gland, trachea, and esophagus demonstrate no significant findings. Bilateral breast implants. Lungs/Pleura: Emphysematous changes in the lungs. No airspace disease or consolidation. No pleural effusions. No pneumothorax. Upper Abdomen: No acute abnormality. Musculoskeletal: Postoperative changes with posterior rod and screw fixation throughout the thoracic spine. Artifact from hardware limits evaluation in some areas. Normal alignment of the thoracic spine. No destructive bone lesions. Review of the MIP images confirms the above findings. IMPRESSION: 1. No evidence of pulmonary embolus. 2. Emphysematous changes in the lungs. No evidence of active pulmonary disease. 3. Aortic atherosclerosis. Aortic Atherosclerosis (ICD10-I70.0) and Emphysema (ICD10-J43.9). Electronically Signed   By: Lucienne Capers M.D.   On: 01/07/2018 00:44        Scheduled Meds: .  aspirin  325 mg Oral Daily  . azelastine  1 spray Each Nare BID  . diazepam  5 mg Oral QHS  . enoxaparin (LOVENOX) injection  40 mg Subcutaneous Q24H  . eszopiclone  3 mg Oral QHS  . gabapentin  600 mg Oral TID  . ipratropium  1 spray Each Nare QID  . levothyroxine  125 mcg Oral QAC breakfast  . loratadine  10 mg Oral Daily  . Melatonin  18 mg Oral QHS  . methocarbamol  2,250 mg Oral QHS  . montelukast  10 mg Oral QHS  . morphine  30 mg Oral BID PC  . naloxegol oxalate  25 mg Oral Daily   Continuous Infusions:   LOS: 0 days     Vernell Leep, MD, FACP, [facility]. [facility] Pager [phone]  If 7PM-7AM, please contact night-coverage [email or web address] Password [email or web address] 01/08/2018, 11:42 AM

## 2018-01-08 NOTE — Progress Notes (Signed)
  Echocardiogram 2D Echocardiogram has been performed.  Sonya Cordova 01/08/2018, 11:57 AM

## 2018-01-08 NOTE — Progress Notes (Signed)
Progress Note  Patient Name: Sonya Cordova Date of Encounter: 01/08/2018  Primary Cardiologist: Kirk Ruths, MD   Subjective   She has had no further CP; denies dyspnea  Inpatient Medications    Scheduled Meds: . aspirin  325 mg Oral Daily  . azelastine  1 spray Each Nare BID  . diazepam  5 mg Oral QHS  . enoxaparin (LOVENOX) injection  40 mg Subcutaneous Q24H  . eszopiclone  3 mg Oral QHS  . gabapentin  600 mg Oral TID  . ipratropium  1 spray Each Nare QID  . levothyroxine  125 mcg Oral QAC breakfast  . loratadine  10 mg Oral Daily  . Melatonin  18 mg Oral QHS  . methocarbamol  2,250 mg Oral QHS  . montelukast  10 mg Oral QHS  . morphine  30 mg Oral BID PC  . naloxegol oxalate  25 mg Oral Daily   Continuous Infusions:  PRN Meds: acetaminophen, methocarbamol, nitroGLYCERIN, ondansetron (ZOFRAN) IV, oxyCODONE, polyethylene glycol   Vital Signs    Vitals:   01/07/18 1146 01/07/18 1949 01/08/18 0611 01/08/18 0900  BP: 124/66 (!) 112/57 (!) 127/58 (!) 116/55  Pulse: 69 69 68 69  Resp: 20 16 16 18   Temp: 97.9 F (36.6 C) 98.3 F (36.8 C) 98.1 F (36.7 C) 97.7 F (36.5 C)  TempSrc: Oral Oral Oral Oral  SpO2: 96% 96% 99% 98%  Weight:   134 lb 8 oz (61 kg)   Height:        Intake/Output Summary (Last 24 hours) at 01/08/2018 1029 Last data filed at 01/08/2018 0900 Gross per 24 hour  Intake 1060 ml  Output 2325 ml  Net -1265 ml   Filed Weights   01/06/18 2106 01/07/18 0500 01/08/18 0611  Weight: 138 lb 3.2 oz (62.7 kg) 137 lb 14.4 oz (62.6 kg) 134 lb 8 oz (61 kg)    Telemetry    Sinus- Personally Reviewed   Physical Exam   GEN: No acute distress.   Neck: No JVD Cardiac: RRR, no murmurs, rubs, or gallops.  Respiratory: Clear to auscultation bilaterally. GI: Soft, nontender, non-distended  MS: No edema Neuro:  Nonfocal  Psych: Normal affect   Labs    Chemistry Recent Labs  Lab 01/06/18 1038 01/06/18 1604  NA 140 142  K 3.6 3.8  CL 101  102  CO2 35* 30  GLUCOSE 80 68  BUN 30* 30*  CREATININE 0.72 0.93  CALCIUM 9.1 9.3  PROT 6.3  --   ALBUMIN 3.8  --   AST 25  --   ALT 17  --   ALKPHOS 54  --   BILITOT 0.5  --   GFRNONAA  --  >60  GFRAA  --  >60  ANIONGAP  --  10     Hematology Recent Labs  Lab 01/06/18 1038 01/06/18 1604  WBC 5.5 5.7  RBC 3.93 4.13  HGB 12.4 12.7  HCT 37.5 40.1  MCV 95.6 97.1  MCH  --  30.8  MCHC 33.2 31.7  RDW 13.3 12.9  PLT 153.0 161    Cardiac Enzymes Recent Labs  Lab 01/07/18 0023 01/07/18 0600  TROPONINI <0.03 <0.03    Recent Labs  Lab 01/06/18 1629 01/06/18 1916  TROPIPOC 0.00 0.01     BNP Recent Labs  Lab 01/06/18 1902  BNP 43.1     DDimer  Recent Labs  Lab 01/06/18 2108  DDIMER 3.25*     Radiology  Dg Chest 2 View  Result Date: 01/06/2018 CLINICAL DATA:  Chest wall tightness for several weeks, no known injury, initial encounter EXAM: CHEST - 2 VIEW COMPARISON:  None. FINDINGS: Extensive fixation hardware is noted throughout the thoracic spine. Mild residual scoliosis concave to the left is noted. Spinal stimulator is noted in the midthoracic spine. Lungs are well aerated bilaterally. No focal infiltrate or sizable effusion is seen. Postsurgical changes in the right shoulder are noted as well. IMPRESSION: No acute abnormality noted. Electronically Signed   By: Inez Catalina M.D.   On: 01/06/2018 16:12   Ct Angio Chest Pe W Or Wo Contrast  Result Date: 01/07/2018 CLINICAL DATA:  Chest tightness beginning tonight. EXAM: CT ANGIOGRAPHY CHEST WITH CONTRAST TECHNIQUE: Multidetector CT imaging of the chest was performed using the standard protocol during bolus administration of intravenous contrast. Multiplanar CT image reconstructions and MIPs were obtained to evaluate the vascular anatomy. CONTRAST:  146mL ISOVUE-370 IOPAMIDOL (ISOVUE-370) INJECTION 76% COMPARISON:  07/29/2015 FINDINGS: Cardiovascular: Satisfactory opacification of the pulmonary arteries to the  segmental level. No evidence of pulmonary embolism. Normal heart size. No pericardial effusion. Aortic calcification. Mediastinum/Nodes: No enlarged mediastinal, hilar, or axillary lymph nodes. Thyroid gland, trachea, and esophagus demonstrate no significant findings. Bilateral breast implants. Lungs/Pleura: Emphysematous changes in the lungs. No airspace disease or consolidation. No pleural effusions. No pneumothorax. Upper Abdomen: No acute abnormality. Musculoskeletal: Postoperative changes with posterior rod and screw fixation throughout the thoracic spine. Artifact from hardware limits evaluation in some areas. Normal alignment of the thoracic spine. No destructive bone lesions. Review of the MIP images confirms the above findings. IMPRESSION: 1. No evidence of pulmonary embolus. 2. Emphysematous changes in the lungs. No evidence of active pulmonary disease. 3. Aortic atherosclerosis. Aortic Atherosclerosis (ICD10-I70.0) and Emphysema (ICD10-J43.9). Electronically Signed   By: Lucienne Capers M.D.   On: 01/07/2018 00:44    Patient Profile     Sonya Cordova is a 72 y.o. female with a hx of chronic back pain, hyperlipidemia who is being seen today for the evaluation of chest pain at the request of Dr Algis Liming. She was seen by her primary care physician and CK-MB was ordered which was elevated.  Total CK 394 with MB 22.4.  Troponins are normal.     Assessment & Plan    1. Chest pain-No further CP; previous symptoms felt to be atypical.  However her initial electrocardiogram showed some degree of inferolateral ST depression which has resolved.  Her CK-MB was elevated but troponin is normal.  Troponin should be more sensitive and specific.  We will await echocardiogram for wall motion.  If abnormal would likely need catheterization.  If normal would plan stress nuclear study in AM.  Continue aspirin.  2. Chronic back pain-continue pain medications; management per primary care.   For questions or  updates, please contact [place] Please consult [email or web address] for contact info under Cardiology/STEMI.      Signed, Kirk Ruths, MD  01/08/2018, 10:29 AM

## 2018-01-09 ENCOUNTER — Observation Stay (HOSPITAL_BASED_OUTPATIENT_CLINIC_OR_DEPARTMENT_OTHER): Payer: Medicare Other

## 2018-01-09 ENCOUNTER — Observation Stay (HOSPITAL_COMMUNITY): Payer: Medicare Other

## 2018-01-09 DIAGNOSIS — T402X5A Adverse effect of other opioids, initial encounter: Secondary | ICD-10-CM | POA: Diagnosis not present

## 2018-01-09 DIAGNOSIS — R079 Chest pain, unspecified: Secondary | ICD-10-CM | POA: Diagnosis not present

## 2018-01-09 DIAGNOSIS — F1190 Opioid use, unspecified, uncomplicated: Secondary | ICD-10-CM | POA: Diagnosis not present

## 2018-01-09 DIAGNOSIS — E039 Hypothyroidism, unspecified: Secondary | ICD-10-CM | POA: Diagnosis not present

## 2018-01-09 DIAGNOSIS — K5903 Drug induced constipation: Secondary | ICD-10-CM | POA: Diagnosis not present

## 2018-01-09 LAB — NM MYOCAR MULTI W/SPECT W/WALL MOTION / EF
Estimated workload: 1 METS
Exercise duration (min): 5 min
Exercise duration (sec): 21 s
LV dias vol: 74 mL (ref 46–106)
LV sys vol: 29 mL
MPHR: 150 {beats}/min
Peak HR: 94 {beats}/min
Percent HR: 62 %
Rest HR: 60 {beats}/min
TID: 0.88

## 2018-01-09 MED ORDER — REGADENOSON 0.4 MG/5ML IV SOLN
0.4000 mg | Freq: Once | INTRAVENOUS | Status: AC
  Administered 2018-01-09: 0.4 mg via INTRAVENOUS
  Filled 2018-01-09: qty 5

## 2018-01-09 MED ORDER — TECHNETIUM TC 99M TETROFOSMIN IV KIT
10.0000 | PACK | Freq: Once | INTRAVENOUS | Status: AC | PRN
  Administered 2018-01-09: 10 via INTRAVENOUS

## 2018-01-09 MED ORDER — REGADENOSON 0.4 MG/5ML IV SOLN
INTRAVENOUS | Status: AC
  Filled 2018-01-09: qty 5

## 2018-01-09 MED ORDER — NALOXEGOL OXALATE 25 MG PO TABS
25.0000 mg | ORAL_TABLET | Freq: Every day | ORAL | Status: DC
  Filled 2018-01-09: qty 1

## 2018-01-09 MED ORDER — TECHNETIUM TC 99M TETROFOSMIN IV KIT
30.0000 | PACK | Freq: Once | INTRAVENOUS | Status: AC | PRN
  Administered 2018-01-09: 30 via INTRAVENOUS

## 2018-01-09 NOTE — Discharge Instructions (Signed)
Please get your medications reviewed and adjusted by your Primary MD. ° °Please request your Primary MD to go over all Hospital Tests and Procedure/Radiological results at the follow up, please get all Hospital records sent to your Prim MD by signing hospital release before you go home. ° °If you had Pneumonia of Lung problems at the Hospital: °Please get a 2 view Chest X ray done in 6-8 weeks after hospital discharge or sooner if instructed by your Primary MD. ° °If you have Congestive Heart Failure: °Please call your Cardiologist or Primary MD anytime you have any of the following symptoms:  °1) 3 pound weight gain in 24 hours or 5 pounds in 1 week  °2) shortness of breath, with or without a dry hacking cough  °3) swelling in the hands, feet or stomach  °4) if you have to sleep on extra pillows at night in order to breathe ° °Follow cardiac low salt diet and 1.5 lit/day fluid restriction. ° °If you have diabetes °Accuchecks 4 times/day, Once in AM empty stomach and then before each meal. °Log in all results and show them to your primary doctor at your next visit. °If any glucose reading is under 80 or above 300 call your primary MD immediately. ° °If you have Seizure/Convulsions/Epilepsy: °Please do not drive, operate heavy machinery, participate in activities at heights or participate in high speed sports until you have seen by Primary MD or a Neurologist and advised to do so again. ° °If you had Gastrointestinal Bleeding: °Please ask your Primary MD to check a complete blood count within one week of discharge or at your next visit. Your endoscopic/colonoscopic biopsies that are pending at the time of discharge, will also need to followed by your Primary MD. ° °Get Medicines reviewed and adjusted. °Please take all your medications with you for your next visit with your Primary MD ° °Please request your Primary MD to go over all hospital tests and procedure/radiological results at the follow up, please ask your  Primary MD to get all Hospital records sent to his/her office. ° °If you experience worsening of your admission symptoms, develop shortness of breath, life threatening emergency, suicidal or homicidal thoughts you must seek medical attention immediately by calling 911 or calling your MD immediately  if symptoms less severe. ° °You must read complete instructions/literature along with all the possible adverse reactions/side effects for all the Medicines you take and that have been prescribed to you. Take any new Medicines after you have completely understood and accpet all the possible adverse reactions/side effects.  ° °Do not drive or operate heavy machinery when taking Pain medications.  ° °Do not take more than prescribed Pain, Sleep and Anxiety Medications ° °Special Instructions: If you have smoked or chewed Tobacco  in the last 2 yrs please stop smoking, stop any regular Alcohol  and or any Recreational drug use. ° °Wear Seat belts while driving. ° °Please note °You were cared for by a hospitalist during your hospital stay. If you have any questions about your discharge medications or the care you received while you were in the hospital after you are discharged, you can call the unit and asked to speak with the hospitalist on call if the hospitalist that took care of you is not available. Once you are discharged, your primary care physician will handle any further medical issues. Please note that NO REFILLS for any discharge medications will be authorized once you are discharged, as it is imperative that you   return to your primary care physician (or establish a relationship with a primary care physician if you do not have one) for your aftercare needs so that they can reassess your need for medications and monitor your lab values. ° °You can reach the hospitalist office at phone [phone] or fax [phone] °  °If you do not have a primary care physician, you can call [phone] for a physician  referral. ° ° °Nonspecific Chest Pain °Chest pain can be caused by many different conditions. There is always a chance that your pain could be related to something serious, such as a heart attack or a blood clot in your lungs. Chest pain can also be caused by conditions that are not life-threatening. If you have chest pain, it is very important to follow up with your health care provider. °What are the causes? °Causes of this condition include: °· Heartburn. °· Pneumonia or bronchitis. °· Anxiety or stress. °· Inflammation around your heart (pericarditis) or lung (pleuritis or pleurisy). °· A blood clot in your lung. °· A collapsed lung (pneumothorax). This can develop suddenly on its own (spontaneous pneumothorax) or from trauma to the chest. °· Shingles infection (varicella-zoster virus). °· Heart attack. °· Damage to the bones, muscles, and cartilage that make up your chest wall. This can include: °? Bruised bones due to injury. °? Strained muscles or cartilage due to frequent or repeated coughing or overwork. °? Fracture to one or more ribs. °? Sore cartilage due to inflammation (costochondritis). ° °What increases the risk? °Risk factors for this condition may include: °· Activities that increase your risk for trauma or injury to your chest. °· Respiratory infections or conditions that cause frequent coughing. °· Medical conditions or overeating that can cause heartburn. °· Heart disease or family history of heart disease. °· Conditions or health behaviors that increase your risk of developing a blood clot. °· Having had chicken pox (varicella zoster). ° °What are the signs or symptoms? °Chest pain can feel like: °· Burning or tingling on the surface of your chest or deep in your chest. °· Crushing, pressure, aching, or squeezing pain. °· Dull or sharp pain that is worse when you move, cough, or take a deep breath. °· Pain that is also felt in your back, neck, shoulder, or arm, or pain that spreads to any of  these areas. ° °Your chest pain may come and go, or it may stay constant. °How is this diagnosed? °Lab tests or other studies may be needed to find the cause of your pain. Your health care provider may have you take a test called an ECG (electrocardiogram). An ECG records your heartbeat patterns at the time the test is performed. You may also have other tests, such as: °· Transthoracic echocardiogram (TTE). In this test, sound waves are used to create a picture of the heart structures and to look at how blood flows through your heart. °· Transesophageal echocardiogram (TEE). This is a more advanced imaging test that takes images from inside your body. It allows your health care provider to see your heart in finer detail. °· Cardiac monitoring. This allows your health care provider to monitor your heart rate and rhythm in real time. °· Holter monitor. This is a portable device that records your heartbeat and can help to diagnose abnormal heartbeats. It allows your health care provider to track your heart activity for several days, if needed. °· Stress tests. These can be done through exercise or by taking medicine that makes your heart   beat more quickly. °· Blood tests. °· Other imaging tests. ° °How is this treated? °Treatment depends on what is causing your chest pain. Treatment may include: °· Medicines. These may include: °? Acid blockers for heartburn. °? Anti-inflammatory medicine. °? Pain medicine for inflammatory conditions. °? Antibiotic medicine, if an infection is present. °? Medicines to dissolve blood clots. °? Medicines to treat coronary artery disease (CAD). °· Supportive care for conditions that do not require medicines. This may include: °? Resting. °? Applying heat or cold packs to injured areas. °? Limiting activities until pain decreases. ° °Follow these instructions at home: °Medicines °· If you were prescribed an antibiotic, take it as told by your health care provider. Do not stop taking the  antibiotic even if you start to feel better. °· Take over-the-counter and prescription medicines only as told by your health care provider. °Lifestyle °· Do not use any products that contain nicotine or tobacco, such as cigarettes and e-cigarettes. If you need help quitting, ask your health care provider. °· Do not drink alcohol. °· Make lifestyle changes as directed by your health care provider. These may include: °? Getting regular exercise. Ask your health care provider to suggest some activities that are safe for you. °? Eating a heart-healthy diet. A registered dietitian can help you to learn healthy eating options. °? Maintaining a healthy weight. °? Managing diabetes, if necessary. °? Reducing stress, such as with yoga or relaxation techniques. °General instructions °· Avoid any activities that bring on chest pain. °· If heartburn is the cause for your chest pain, raise (elevate) the head of your bed about 6 inches (15 cm) by putting blocks under the legs. Sleeping with more pillows does not effectively relieve heartburn because it only changes the position of your head. °· Keep all follow-up visits as told by your health care provider. This is important. This includes any further testing if your chest pain does not go away. °Contact a health care provider if: °· Your chest pain does not go away. °· You have a rash with blisters on your chest. °· You have a fever. °· You have chills. °Get help right away if: °· Your chest pain is worse. °· You have a cough that gets worse, or you cough up blood. °· You have severe pain in your abdomen. °· You have severe weakness. °· You faint. °· You have sudden, unexplained chest discomfort. °· You have sudden, unexplained discomfort in your arms, back, neck, or jaw. °· You have shortness of breath at any time. °· You suddenly start to sweat, or your skin gets clammy. °· You feel nauseous or you vomit. °· You suddenly feel light-headed or dizzy. °· Your heart begins to beat  quickly, or it feels like it is skipping beats. °These symptoms may represent a serious problem that is an emergency. Do not wait to see if the symptoms will go away. Get medical help right away. Call your local emergency services (911 in the [place] Do not drive yourself to the hospital. °This information is not intended to replace advice given to you by your health care provider. Make sure you discuss any questions you have with your health care provider. °Document Released: 06/30/2005 Document Revised: 06/14/2016 Document Reviewed: 06/14/2016 °Elsevier Interactive Patient Education © 2017 [employer] ° °

## 2018-01-09 NOTE — Progress Notes (Signed)
Dc instructions reviewed and all questions entertained and answered

## 2018-01-09 NOTE — Progress Notes (Addendum)
Progress Note  Patient Name: Sonya Cordova Date of Encounter: 01/09/2018  Primary Cardiologist: Dr. Kirk Ruths  Subjective   Pt with 3/10 chest tightness early this AM>>spontaneous resolution. Plan is for Lexiscan stress today given that her echo was normal with NWMA.   Inpatient Medications    Scheduled Meds: . aspirin  325 mg Oral Daily  . azelastine  1 spray Each Nare BID  . diazepam  5 mg Oral QHS  . enoxaparin (LOVENOX) injection  40 mg Subcutaneous Q24H  . eszopiclone  3 mg Oral QHS  . gabapentin  600 mg Oral TID  . ipratropium  1 spray Each Nare QID  . levothyroxine  125 mcg Oral QAC breakfast  . loratadine  10 mg Oral Daily  . Melatonin  18 mg Oral QHS  . methocarbamol  2,250 mg Oral QHS  . montelukast  10 mg Oral QHS  . morphine  30 mg Oral BID PC  . naloxegol oxalate  25 mg Oral Daily   Continuous Infusions:  PRN Meds: acetaminophen, methocarbamol, nitroGLYCERIN, ondansetron (ZOFRAN) IV, oxyCODONE, polyethylene glycol   Vital Signs    Vitals:   01/08/18 0900 01/08/18 1223 01/08/18 2030 01/09/18 0614  BP: (!) 116/55 (!) 123/54 104/86 (!) 104/55  Pulse: 69 65 81 78  Resp: 18 20 18 18   Temp: 97.7 F (36.5 C) (!) 97.5 F (36.4 C) 98.5 F (36.9 C) 97.8 F (36.6 C)  TempSrc: Oral Oral Oral Oral  SpO2: 98% 95% 96% 95%  Weight:    133 lb 1.6 oz (60.4 kg)  Height:        Intake/Output Summary (Last 24 hours) at 01/09/2018 0739 Last data filed at 01/09/2018 0728 Gross per 24 hour  Intake 540 ml  Output 1850 ml  Net -1310 ml   Filed Weights   01/07/18 0500 01/08/18 0611 01/09/18 0614  Weight: 137 lb 14.4 oz (62.6 kg) 134 lb 8 oz (61 kg) 133 lb 1.6 oz (60.4 kg)    Physical Exam   General: Well developed, well nourished, NAD Skin: Warm, dry, intact  Head: Normocephalic, atraumatic, clear, moist mucus membranes. Neck: Negative for carotid bruits. No JVD Lungs:Clear to ausculation bilaterally. No wheezes, rales, or rhonchi. Breathing is  unlabored. Cardiovascular: RRR with S1 S2. No murmurs, rubs or gallops Abdomen: Soft, non-tender, non-distended with normoactive bowel sounds. No obvious abdominal masses. MSK: Strength and tone appear normal for age. 5/5 in all extremities Extremities: No edema. No clubbing or cyanosis. DP/PT pulses 2+ bilaterally Neuro: Alert and oriented. No focal deficits. No facial asymmetry. MAE spontaneously. Psych: Responds to questions appropriately with normal affect.   Labs    Chemistry Recent Labs  Lab 01/06/18 1038 01/06/18 1604  NA 140 142  K 3.6 3.8  CL 101 102  CO2 35* 30  GLUCOSE 80 68  BUN 30* 30*  CREATININE 0.72 0.93  CALCIUM 9.1 9.3  PROT 6.3  --   ALBUMIN 3.8  --   AST 25  --   ALT 17  --   ALKPHOS 54  --   BILITOT 0.5  --   GFRNONAA  --  >60  GFRAA  --  >60  ANIONGAP  --  10     Hematology Recent Labs  Lab 01/06/18 1038 01/06/18 1604  WBC 5.5 5.7  RBC 3.93 4.13  HGB 12.4 12.7  HCT 37.5 40.1  MCV 95.6 97.1  MCH  --  30.8  MCHC 33.2 31.7  RDW 13.3  12.9  PLT 153.0 161    Cardiac Enzymes Recent Labs  Lab 01/07/18 0023 01/07/18 0600  TROPONINI <0.03 <0.03    Recent Labs  Lab 01/06/18 1629 01/06/18 1916  TROPIPOC 0.00 0.01     BNP Recent Labs  Lab 01/06/18 1902  BNP 43.1     DDimer  Recent Labs  Lab 01/06/18 2108  DDIMER 3.25*     Radiology    No results found.  Telemetry    01/09/18 NSR HR 72 - Personally Reviewed  ECG    No new tracing as of 01/09/18- Personally Reviewed  Cardiac Studies   Echocardiogram 01/08/18: Study Conclusions  - Left ventricle: The cavity size was normal. Wall thickness was   normal. Systolic function was normal. The estimated ejection   fraction was in the range of 55% to 60%. Wall motion was normal;   there were no regional wall motion abnormalities. Doppler   parameters are consistent with abnormal left ventricular   relaxation (grade 1 diastolic dysfunction). - Mitral valve: Mild  prolapse, involving the posterior leaflet.  Impressions:  - Normal LV systolic function; mild diastolic dysfunction; mild   prolapse of posterior MV leaflet.  Patient Profile     72 y.o. female with a hx ofchronic back pain and hyperlipidemiawho is being seen today for the evaluation of chest painat the request of Dr Algis Liming. She was seen by her primary care physician and CK-MB was ordered which was elevated. Total CK 394 with MB 22.4.Troponins are normal.   Plan for nuc stress 01/09/18  Assessment & Plan    1. Chest pain: -EKG with initial inferolateral ST depression which has now resolved on subsequent tracings -Trop, <0.03, <0.03 -CK elevated prior to admission at 394/MB 22.4 -Mild chest tightness early this AM>>>spontaneous resolution  -Echo with normal LVEF function and NWMA -Will plan for Lexiscan stress for further ischemic evaluation given normal echo 01/09/18 -ASA>>will change to 81mg  PO QD  2. Chronic back pain: -Per primary team   3. Elevated d-dimer: -D-dimer, 3.25 -CTA negative for PE    Signed, Kathyrn Drown NP-C [facility] Pager: [phone] 01/09/2018, 7:39 AM     For questions or updates, please contact   Please consult [email or web address] for contact info under Cardiology/STEMI.

## 2018-01-09 NOTE — Discharge Summary (Signed)
Physician Discharge Summary  Sonya Cordova ZJQ:[id number] DOB: 07-10-1946  PCP: Crecencio Mc, MD  Admit date: 01/06/2018 Discharge date: 01/09/2018  Recommendations for Outpatient Follow-up:  1. Dr. Deborra Medina, PCP in 1 week. 2. Dr. Kirk Ruths, Cardiology  Home Health: None Equipment/Devices: None  Discharge Condition: Improved and stable CODE STATUS: Full Diet recommendation: Heart healthy diet.  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 Nicholaus Bloom, pain MD), hypothyroid, presented to ED on 01/06/18 with complaints of chest tightness/discomfort.  Admitted for observation and further evaluation.  Cardiology consulted.   Assessment & Plan:   Chest pain: Present for couple of weeks but patient not exactly certain.  Has mostly atypical and some typical characteristics (responded to NTG in the hospital).  She mentioned this to her pain MD following which she was seen by her PCP on day of admission, OP EKG reportedly with some abnormal findings, and elevated CK 394 with MB 22.4 and advised to come to ED for evaluation.  Since admission troponins x 2: Neg. EKG 4/6 10:14 AM: Low voltage, sinus rhythm, Q waves in leads V1-2 but no acute changes.  Since admission, had 2 episodes of chest pain that responded well to sublingual NTG.  Consulted cardiology, checked TTE which showed normal EF without wall motion abnormalities.  This was followed by nuclear stress test which was low risk study.  I discussed with Cardiology team and they have cleared her for discharge home.  Chest pain resolved. CTA chest negative for PE. I have updated patient regarding her stress test results and answered questions.  Chronic pain: Continue current  regimen.  Controlled.  Outpatient follow-up with pain MD.  Insomnia: Continue home medications.    Chronic constipation: Secondary to opioids.  Continue Movantik and MiraLAX.  Hypothyroid: Clinically euthyroid.  Continue Synthroid.   Consultants:  Cardiology  Procedures:  As above.   Discharge Instructions  Discharge Instructions    Activity as tolerated - No restrictions   Complete by:  As directed    Call MD for:  difficulty breathing, headache or visual disturbances   Complete by:  As directed    Call MD for:  extreme fatigue   Complete by:  As directed    Call MD for:  persistant dizziness or light-headedness   Complete by:  As directed    Call MD for:  severe uncontrolled pain   Complete by:  As directed    Diet - low sodium heart healthy   Complete by:  As directed        Medication List    STOP taking these medications   mirabegron ER 25 MG Tb24 tablet Commonly known as:  MYRBETRIQ     TAKE these medications   azelastine 0.1 % nasal spray Commonly known as:  ASTELIN Place 1 spray into both nostrils 2 (two) times daily.   diazepam 5 MG tablet Commonly known as:  VALIUM TAKE 1 TABLET BY MOUTH AT BEDTIME AS NEEDED FOR ANXIETY What changed:  See the new instructions.   diclofenac sodium 1 % Gel Commonly known as:  VOLTAREN Apply 1 application topically daily as needed (pain).   eszopiclone 3 MG Tabs Generic drug:  Eszopiclone Take 3 mg by mouth at  bedtime. Take immediately before bedtime. Name Brand ONLY   gabapentin 300 MG capsule Commonly known as:  NEURONTIN Take 600 mg by mouth 3 (three) times daily.   ibuprofen 200 MG tablet Commonly known as:  ADVIL,MOTRIN Take 600 mg by mouth 2 (two) times daily.   ipratropium 0.06 % nasal spray Commonly known as:  ATROVENT USE TWO SPRAY(S) IN EACH NOSTRIL 4 TIMES DAILY What changed:    how much to take  how to take this  when to take this  reasons to take this  additional instructions    levothyroxine 125 MCG tablet Commonly known as:  SYNTHROID Take 1 tablet (125 mcg total) by mouth daily before breakfast. What changed:  additional instructions   loratadine 10 MG tablet Commonly known as:  CLARITIN Take 10 mg by mouth daily.   Melatonin 10 MG Tabs Take 20 mg by mouth at bedtime.   methocarbamol 750 MG tablet Commonly known as:  ROBAXIN Take 750-2,250 mg by mouth See admin instructions. Take 3 tablets (2250 mg) by mouth daily at bedtime, may also take 1 or 2 tablets ([id number]  mg) during the day every 6 hours as needed for muscle spasms   montelukast 10 MG tablet Commonly known as:  SINGULAIR TAKE ONE TABLET BY MOUTH AT BEDTIME What changed:    how much to take  how to take this  when to take this   morphine 30 MG tablet Commonly known as:  MSIR Take 30 mg by mouth every 8 (eight) hours. pain   MOVANTIK 25 MG Tabs tablet Generic drug:  naloxegol oxalate Take 25 mg by mouth daily.   polyethylene glycol packet Commonly known as:  MIRALAX / GLYCOLAX Take 17 g by mouth daily as needed (constipation).   PROBIOTIC ACIDOPHILUS PO Take 1 capsule by mouth daily.   Red Yeast Rice 600 MG Caps Take 600 mg by mouth 2 (two) times daily.   tiZANidine 4 MG tablet Commonly known as:  ZANAFLEX Take 1 tablet (4 mg total) by mouth every 6 (six) hours as needed for muscle spasms.   torsemide 5 MG tablet Commonly known as:  DEMADEX Take 1 tablet (5 mg total) by mouth daily as needed. What changed:  reasons to take this   Vitamin D3 1000 units Caps Take 1,000 Units by mouth at bedtime.      Follow-up Information    Crecencio Mc, MD. Schedule an appointment as soon as possible for a visit in 1 week(s).   Specialty:  Internal Medicine Contact information: [address] [place] [address] [phone]        Lelon Perla, MD .   Specialty:  Cardiology Contact information: [address] [place]  [address] [phone]          Allergies  Allergen Reactions  . Cleocin [Clindamycin Hcl] Anaphylaxis  . Amoxicillin-Pot Clavulanate Hives    Reaction to augmentin Has patient had a PCN reaction causing immediate rash, facial/tongue/throat swelling, SOB or lightheadedness with hypotension: Yes Has patient had a PCN reaction causing severe rash involving mucus membranes or skin necrosis: No Has patient had a PCN reaction that required hospitalization: No Has patient had a PCN reaction occurring within the last 10 years: No If all of the above answers are "NO", then may proceed with Cephalosporin use.  . Codeine Nausea And Vomiting      Procedures/Studies: Dg Chest 2 View  Result Date: 01/06/2018 CLINICAL DATA:  Chest wall tightness for  several weeks, no known injury, initial encounter EXAM: CHEST - 2 VIEW COMPARISON:  None. FINDINGS: Extensive fixation hardware is noted throughout the thoracic spine. Mild residual scoliosis concave to the left is noted. Spinal stimulator is noted in the midthoracic spine. Lungs are well aerated bilaterally. No focal infiltrate or sizable effusion is seen. Postsurgical changes in the right shoulder are noted as well. IMPRESSION: No acute abnormality noted. Electronically Signed   By: Inez Catalina M.D.   On: 01/06/2018 16:12   Ct Angio Chest Pe W Or Wo Contrast  Result Date: 01/07/2018 CLINICAL DATA:  Chest tightness beginning tonight. EXAM: CT ANGIOGRAPHY CHEST WITH CONTRAST TECHNIQUE: Multidetector CT imaging of the chest was performed using the standard protocol during bolus administration of intravenous contrast. Multiplanar CT image reconstructions and MIPs were obtained to evaluate the vascular anatomy. CONTRAST:  167mL ISOVUE-370 IOPAMIDOL (ISOVUE-370) INJECTION 76% COMPARISON:  07/29/2015 FINDINGS: Cardiovascular: Satisfactory opacification of the pulmonary arteries to the segmental level. No evidence of pulmonary embolism. Normal heart size. No  pericardial effusion. Aortic calcification. Mediastinum/Nodes: No enlarged mediastinal, hilar, or axillary lymph nodes. Thyroid gland, trachea, and esophagus demonstrate no significant findings. Bilateral breast implants. Lungs/Pleura: Emphysematous changes in the lungs. No airspace disease or consolidation. No pleural effusions. No pneumothorax. Upper Abdomen: No acute abnormality. Musculoskeletal: Postoperative changes with posterior rod and screw fixation throughout the thoracic spine. Artifact from hardware limits evaluation in some areas. Normal alignment of the thoracic spine. No destructive bone lesions. Review of the MIP images confirms the above findings. IMPRESSION: 1. No evidence of pulmonary embolus. 2. Emphysematous changes in the lungs. No evidence of active pulmonary disease. 3. Aortic atherosclerosis. Aortic Atherosclerosis (ICD10-I70.0) and Emphysema (ICD10-J43.9). Electronically Signed   By: Lucienne Capers M.D.   On: 01/07/2018 00:44   Nm Myocar Multi W/spect W/wall Motion / Ef  Result Date: 01/09/2018  T wave inversion was noted during stress.  The study is normal.  This is a low risk study.  The left ventricular ejection fraction is normal (55-65%).       Subjective: Patient had transient approximately 20 seconds of lower mid substernal discomfort early this morning but none since.  Resolved spontaneously.  Denies any other complaints.  Anxious to go home.  Discharge Exam:  Vitals:   01/09/18 1002 01/09/18 1004 01/09/18 1006 01/09/18 1334  BP: 126/75 (!) 126/51 (!) 121/54 (!) 109/55  Pulse:    62  Resp:      Temp:    (!) 97.5 F (36.4 C)  TempSrc:    Oral  SpO2:    99%  Weight:      Height:        General exam: Pleasant elderly female, moderately built and nourished, lying comfortably supine in bed. Respiratory system: Clear to auscultation. Respiratory effort normal.  CP nonreproducible to palpation.   Cardiovascular system: S1 & S2 heard, RRR. No JVD, murmurs,  rubs, gallops or clicks. No pedal edema.  Telemetry personally reviewed: Sinus rhythm. Gastrointestinal system: Abdomen is nondistended, soft and nontender. No organomegaly or masses felt. Normal bowel sounds heard. Central nervous system: Alert and oriented. No focal neurological deficits. Extremities: Symmetric 5 x 5 power. Skin: No rashes, lesions or ulcers Psychiatry: Judgement and insight appear normal. Mood & affect appropriate.      The results of significant diagnostics from this hospitalization (including imaging, microbiology, ancillary and laboratory) are listed below for reference.      Labs: CBC: Recent Labs  Lab 01/06/18 1038 01/06/18 1604  WBC  5.5 5.7  NEUTROABS 3.5  --   HGB 12.4 12.7  HCT 37.5 40.1  MCV 95.6 97.1  PLT 153.0 401   Basic Metabolic Panel: Recent Labs  Lab 01/06/18 1038 01/06/18 1604  NA 140 142  K 3.6 3.8  CL 101 102  CO2 35* 30  GLUCOSE 80 68  BUN 30* 30*  CREATININE 0.72 0.93  CALCIUM 9.1 9.3  MG 1.8  --    Liver Function Tests: Recent Labs  Lab 01/06/18 1038  AST 25  ALT 17  ALKPHOS 54  BILITOT 0.5  PROT 6.3  ALBUMIN 3.8   BNP (last 3 results) Recent Labs    01/06/18 1902  BNP 43.1   Cardiac Enzymes: Recent Labs  Lab 01/06/18 1038 01/07/18 0023 01/07/18 0600  CKTOTAL 394*  --   --   CKMB 22.4*  --   --   TROPONINI  --  <0.03 <0.03       Time coordinating discharge: Less than 30 minutes  SIGNED:  Vernell Leep, MD, FACP, [facility] Pager [phone]  If 7PM-7AM, please contact night-coverage [email or web address] Password [facility] 01/09/2018, 4:51 PM

## 2018-01-09 NOTE — Progress Notes (Signed)
Pt to stress lab in stable condition via wheelchair, sr on telemetry, had been NPO except sips withearly am meds

## 2018-01-09 NOTE — Progress Notes (Signed)
   Sonya Cordova presented for a nuclear stress test today.  No immediate complications.  Stress imaging is pending at this time.  Preliminary EKG findings may be listed in the chart, but the stress test result will not be finalized until perfusion imaging is complete.  1 day study, [facility] to read.  Rosaria Ferries, PA-C 01/09/2018, 9:59 AM

## 2018-01-12 ENCOUNTER — Encounter: Payer: Self-pay | Admitting: Internal Medicine

## 2018-01-13 ENCOUNTER — Encounter: Payer: Self-pay | Admitting: Internal Medicine

## 2018-01-13 ENCOUNTER — Ambulatory Visit (INDEPENDENT_AMBULATORY_CARE_PROVIDER_SITE_OTHER): Payer: Medicare Other | Admitting: Internal Medicine

## 2018-01-13 ENCOUNTER — Other Ambulatory Visit: Payer: Self-pay

## 2018-01-13 DIAGNOSIS — R072 Precordial pain: Secondary | ICD-10-CM

## 2018-01-13 DIAGNOSIS — I200 Unstable angina: Secondary | ICD-10-CM

## 2018-01-13 MED ORDER — NITROGLYCERIN 0.4 MG SL SUBL: 0.4000 mg | SUBLINGUAL_TABLET | SUBLINGUAL | 3 refills | Status: AC | PRN

## 2018-01-13 MED ORDER — PANTOPRAZOLE SODIUM 40 MG PO TBEC: 40.0000 mg | DELAYED_RELEASE_TABLET | Freq: Every day | ORAL | 2 refills | Status: DC

## 2018-01-13 NOTE — Progress Notes (Signed)
Subjective:  Patient ID: Sonya Cordova, female    DOB: 28-Apr-1946  Age: 72 y.o. MRN: [id number]  CC: The encounter diagnosis was Substernal chest pain.  HPI Sonya Cordova presents for hospital follow up . She was admitted to [facility] on April 5 with Unstable angina after being seen in the office for evaluation of a two week history of  chest tightness. She was noted to have elevated CK/MB and was sent to the ER. She was admitted with the diagnosis of [place] and underwent ECHO and nuclear stress test which were unrevealing. CTA was done to rule out PE.2 episodes occurrred in house that responded to TG. She was [phone] ged home on April 8 without NTG and no follow up.  She has not had any recurrent episodes    Lyrica was added by pain clinic to gabapentin  The day prior to presentation    Outpatient Medications Prior to Visit  Medication Sig Dispense Refill  . azelastine (ASTELIN) 0.1 % nasal spray Place 1 spray into both nostrils 2 (two) times daily.    . Cholecalciferol (VITAMIN D3) 1000 units CAPS Take 1,000 Units by mouth at bedtime.     . diazepam (VALIUM) 5 MG tablet TAKE 1 TABLET BY MOUTH AT BEDTIME AS NEEDED FOR ANXIETY (Patient taking differently: TAKE 1 TABLET (5 MG) BY MOUTH DAILY AT BEDTIME) 30 tablet 1  . diclofenac sodium (VOLTAREN) 1 % GEL Apply 1 application topically daily as needed (pain).    . Eszopiclone (ESZOPICLONE) 3 MG TABS Take 3 mg by mouth at bedtime. Take immediately before bedtime. Name Brand ONLY    . gabapentin (NEURONTIN) 300 MG capsule Take 600 mg by mouth 3 (three) times daily.    Marland Kitchen ibuprofen (ADVIL,MOTRIN) 200 MG tablet Take 600 mg by mouth 2 (two) times daily.    Marland Kitchen ipratropium (ATROVENT) 0.06 % nasal spray USE TWO SPRAY(S) IN EACH NOSTRIL 4 TIMES DAILY (Patient taking differently: Place 2 sprays into both nostrils 4 (four) times daily as needed for rhinitis. ) 15 mL 5  . Lactobacillus (PROBIOTIC ACIDOPHILUS PO) Take 1 capsule by mouth daily.      Marland Kitchen levothyroxine (SYNTHROID) 125 MCG tablet Take 1 tablet (125 mcg total) by mouth daily before breakfast. (Patient taking differently: Take 125 mcg by mouth daily before breakfast. Must be brand name Synthroid) 90 tablet 0  . loratadine (CLARITIN) 10 MG tablet Take 10 mg by mouth daily.    . Melatonin 10 MG TABS Take 20 mg by mouth at bedtime.    . methocarbamol (ROBAXIN) 750 MG tablet Take 750-2,250 mg by mouth See admin instructions. Take 3 tablets (2250 mg) by mouth daily at bedtime, may also take 1 or 2 tablets [phone]  mg) during the day every 6 hours as needed for muscle spasms    . montelukast (SINGULAIR) 10 MG tablet TAKE ONE TABLET BY MOUTH AT BEDTIME (Patient taking differently: TAKE ONE TABLET (10 MG) BY MOUTH AT BEDTIME) 90 tablet 2  . morphine (MSIR) 30 MG tablet Take 30 mg by mouth every 8 (eight) hours. pain    . naloxegol oxalate (MOVANTIK) 25 MG TABS tablet Take 25 mg by mouth daily.     . polyethylene glycol (MIRALAX / GLYCOLAX) packet Take 17 g by mouth daily as needed (constipation).     . Red Yeast Rice 600 MG CAPS Take 600 mg by mouth 2 (two) times daily.    Marland Kitchen tiZANidine (ZANAFLEX) 4 MG tablet Take 1 tablet (  4 mg total) by mouth every 6 (six) hours as needed for muscle spasms. 90 tablet 1  . torsemide (DEMADEX) 5 MG tablet Take 1 tablet (5 mg total) by mouth daily as needed. (Patient taking differently: Take 5 mg by mouth daily as needed (swelling/edema). ) 90 tablet 0   No facility-administered medications prior to visit.     Review of Systems;  Patient denies headache, fevers, malaise, unintentional weight loss, skin rash, eye pain, sinus congestion and sinus pain, sore throat, dysphagia,  hemoptysis , cough, dyspnea, wheezing, chest pain, palpitations, orthopnea, edema, abdominal pain, nausea, melena, diarrhea, constipation, flank pain, dysuria, hematuria, urinary  Frequency, nocturia, numbness, tingling, seizures,  Focal weakness, Loss of consciousness,  Tremor,  insomnia, depression, anxiety, and suicidal ideation.      Objective:  BP 118/62 (BP Location: Left Arm, Patient Position: Sitting, Cuff Size: Normal)   Pulse 79   Temp 97.9 F (36.6 C)   Wt 137 lb 6.4 oz (62.3 kg)   SpO2 99%   BMI 21.52 kg/m   BP Readings from Last 3 Encounters:  01/13/18 118/62  01/09/18 (!) 109/55  01/06/18 120/64    Wt Readings from Last 3 Encounters:  01/13/18 137 lb 6.4 oz (62.3 kg)  01/09/18 133 lb 1.6 oz (60.4 kg)  01/06/18 139 lb 3.2 oz (63.1 kg)    General appearance: alert, cooperative and appears stated age Ears: normal TM's and external ear canals both ears Throat: lips, mucosa, and tongue normal; teeth and gums normal Neck: no adenopathy, no carotid bruit, supple, symmetrical, trachea midline and thyroid not enlarged, symmetric, no tenderness/mass/nodules Back: symmetric, no curvature. ROM normal. No CVA tenderness. Lungs: clear to auscultation bilaterally Heart: regular rate and rhythm, S1, S2 normal, no murmur, click, rub or gallop Abdomen: soft, non-tender; bowel sounds normal; no masses,  no organomegaly Pulses: 2+ and symmetric Skin: Skin color, texture, turgor normal. No rashes or lesions Lymph nodes: Cervical, supraclavicular, and axillary nodes normal.  No results found for: HGBA1C  Lab Results  Component Value Date   CREATININE 0.93 01/06/2018   CREATININE 0.72 01/06/2018   CREATININE 0.95 06/14/2017    Lab Results  Component Value Date   WBC 5.7 01/06/2018   HGB 12.7 01/06/2018   HCT 40.1 01/06/2018   PLT 161 01/06/2018   GLUCOSE 68 01/06/2018   CHOL 189 08/31/2016   TRIG 65.0 08/31/2016   HDL 73.70 08/31/2016   LDLDIRECT 122.0 06/14/2017   LDLCALC 102 (H) 08/31/2016   ALT 17 01/06/2018   AST 25 01/06/2018   NA 142 01/06/2018   K 3.8 01/06/2018   CL 102 01/06/2018   CREATININE 0.93 01/06/2018   BUN 30 (H) 01/06/2018   CO2 30 01/06/2018   TSH 1.88 01/06/2018   INR 0.85 05/16/2012   MICROALBUR 0.3 11/01/2012      Dg Chest 2 View  Result Date: 01/06/2018 CLINICAL DATA:  Chest wall tightness for several weeks, no known injury, initial encounter EXAM: CHEST - 2 VIEW COMPARISON:  None. FINDINGS: Extensive fixation hardware is noted throughout the thoracic spine. Mild residual scoliosis concave to the left is noted. Spinal stimulator is noted in the midthoracic spine. Lungs are well aerated bilaterally. No focal infiltrate or sizable effusion is seen. Postsurgical changes in the right shoulder are noted as well. IMPRESSION: No acute abnormality noted. Electronically Signed   By: Inez Catalina M.D.   On: 01/06/2018 16:12   Ct Angio Chest Pe W Or Wo Contrast  Result Date: 01/07/2018 CLINICAL  DATA:  Chest tightness beginning tonight. EXAM: CT ANGIOGRAPHY CHEST WITH CONTRAST TECHNIQUE: Multidetector CT imaging of the chest was performed using the standard protocol during bolus administration of intravenous contrast. Multiplanar CT image reconstructions and MIPs were obtained to evaluate the vascular anatomy. CONTRAST:  123mL ISOVUE-370 IOPAMIDOL (ISOVUE-370) INJECTION 76% COMPARISON:  07/29/2015 FINDINGS: Cardiovascular: Satisfactory opacification of the pulmonary arteries to the segmental level. No evidence of pulmonary embolism. Normal heart size. No pericardial effusion. Aortic calcification. Mediastinum/Nodes: No enlarged mediastinal, hilar, or axillary lymph nodes. Thyroid gland, trachea, and esophagus demonstrate no significant findings. Bilateral breast implants. Lungs/Pleura: Emphysematous changes in the lungs. No airspace disease or consolidation. No pleural effusions. No pneumothorax. Upper Abdomen: No acute abnormality. Musculoskeletal: Postoperative changes with posterior rod and screw fixation throughout the thoracic spine. Artifact from hardware limits evaluation in some areas. Normal alignment of the thoracic spine. No destructive bone lesions. Review of the MIP images confirms the above findings. IMPRESSION:  1. No evidence of pulmonary embolus. 2. Emphysematous changes in the lungs. No evidence of active pulmonary disease. 3. Aortic atherosclerosis. Aortic Atherosclerosis (ICD10-I70.0) and Emphysema (ICD10-J43.9). Electronically Signed   By: Lucienne Capers M.D.   On: 01/07/2018 00:44    Assessment & Plan:   Problem List Items Addressed This Visit    Substernal chest pain    Admission yielded normal ECHO,  Low prob nuclear  med study, and CTA negative for PE> Suspect GERD with esophageal spasm.  recommend daily PPI and prm Mylanta.  NTG rx given           Patient is stable post discharge from ER and has no new issues or questions about discharge plans at the visit today for ER follow up.  I have reviewed the records from the ER evaluation in detail with patient today.   I am having Sonya Cordova start on pantoprazole and nitroGLYCERIN. I am also having her maintain her morphine, polyethylene glycol, Eszopiclone, naloxegol oxalate, Red Yeast Rice, Lactobacillus (PROBIOTIC ACIDOPHILUS PO), Vitamin D3, torsemide, montelukast, tiZANidine, levothyroxine, ipratropium, diazepam, gabapentin, methocarbamol, Melatonin, ibuprofen, loratadine, azelastine, and diclofenac sodium.  Meds ordered this encounter  Medications  . pantoprazole (PROTONIX) 40 MG tablet    Sig: Take 1 tablet (40 mg total) by mouth daily.    Dispense:  30 tablet    Refill:  2  . nitroGLYCERIN (NITROSTAT) 0.4 MG SL tablet    Sig: Place 1 tablet (0.4 mg total) under the tongue every 5 (five) minutes as needed for chest pain.    Dispense:  50 tablet    Refill:  3    There are no discontinued medications.  Follow-up: Return in about 3 months (around 04/14/2018).   Crecencio Mc, MD

## 2018-01-13 NOTE — Patient Instructions (Addendum)
I recommend a trial of generic protonix : one tablet at bedtime to see if the recurrent pain is from esophagitis   Mylanta and Gaviscon (liquid) will also be worth trying for acute symptoms

## 2018-01-15 NOTE — Assessment & Plan Note (Addendum)
Admission yielded normal ECHO,  Low prob nuclear  med study, and CTA negative for PE> Suspect GERD with esophageal spasm.  recommend daily PPI and prm Mylanta.  NTG rx given

## 2018-02-01 DIAGNOSIS — G4700 Insomnia, unspecified: Secondary | ICD-10-CM | POA: Diagnosis not present

## 2018-02-01 DIAGNOSIS — R079 Chest pain, unspecified: Secondary | ICD-10-CM | POA: Diagnosis not present

## 2018-02-01 DIAGNOSIS — M961 Postlaminectomy syndrome, not elsewhere classified: Secondary | ICD-10-CM | POA: Diagnosis not present

## 2018-02-01 DIAGNOSIS — G894 Chronic pain syndrome: Secondary | ICD-10-CM | POA: Diagnosis not present

## 2018-02-15 ENCOUNTER — Other Ambulatory Visit: Payer: Self-pay | Admitting: Internal Medicine

## 2018-02-15 DIAGNOSIS — Z1231 Encounter for screening mammogram for malignant neoplasm of breast: Secondary | ICD-10-CM

## 2018-02-28 ENCOUNTER — Ambulatory Visit (INDEPENDENT_AMBULATORY_CARE_PROVIDER_SITE_OTHER): Payer: Medicare Other | Admitting: Podiatry

## 2018-02-28 ENCOUNTER — Ambulatory Visit (INDEPENDENT_AMBULATORY_CARE_PROVIDER_SITE_OTHER): Payer: Medicare Other

## 2018-02-28 ENCOUNTER — Encounter: Payer: Self-pay | Admitting: Podiatry

## 2018-02-28 DIAGNOSIS — T847XXS Infection and inflammatory reaction due to other internal orthopedic prosthetic devices, implants and grafts, sequela: Secondary | ICD-10-CM

## 2018-02-28 DIAGNOSIS — I200 Unstable angina: Secondary | ICD-10-CM | POA: Diagnosis not present

## 2018-02-28 NOTE — Patient Instructions (Signed)
Pre-Operative Instructions  Congratulations, you have decided to take an important step towards improving your quality of life.  You can be assured that the doctors and staff at [facility] will be with you every step of the way.  Here are some important things you should know:  1. Plan to be at the surgery center/hospital at least 1 (one) hour prior to your scheduled time, unless otherwise directed by the surgical center/hospital staff.  You must have a responsible adult accompany you, remain during the surgery and drive you home.  Make sure you have directions to the surgical center/hospital to ensure you arrive on time. 2. If you are having surgery at [facility] or [facility] hospitals, you will need a copy of your medical history and physical form from your family physician within one month prior to the date of surgery. We will give you a form for your primary physician to complete.  3. We make every effort to accommodate the date you request for surgery.  However, there are times where surgery dates or times have to be moved.  We will contact you as soon as possible if a change in schedule is required.   4. No aspirin/ibuprofen for one week before surgery.  If you are on aspirin, any non-steroidal anti-inflammatory medications (Mobic, Aleve, Ibuprofen) should not be taken seven (7) days prior to your surgery.  You make take Tylenol for pain prior to surgery.  5. Medications - If you are taking daily heart and blood pressure medications, seizure, reflux, allergy, asthma, anxiety, pain or diabetes medications, make sure you notify the surgery center/hospital before the day of surgery so they can tell you which medications you should take or avoid the day of surgery. 6. No food or drink after midnight the night before surgery unless directed otherwise by surgical center/hospital staff. 7. No alcoholic beverages 24-hours prior to surgery.  No smoking 24-hours prior or 24-hours after  surgery. 8. Wear loose pants or shorts. They should be loose enough to fit over bandages, boots, and casts. 9. Don't wear slip-on shoes. Sneakers are preferred. 10. Bring your boot with you to the surgery center/hospital.  Also bring crutches or a walker if your physician has prescribed it for you.  If you do not have this equipment, it will be provided for you after surgery. 11. If you have not been contacted by the surgery center/hospital by the day before your surgery, call to confirm the date and time of your surgery. 12. Leave-time from work may vary depending on the type of surgery you have.  Appropriate arrangements should be made prior to surgery with your employer. 13. Prescriptions will be provided immediately following surgery by your doctor.  Fill these as soon as possible after surgery and take the medication as directed. Pain medications will not be refilled on weekends and must be approved by the doctor. 14. Remove nail polish on the operative foot and avoid getting pedicures prior to surgery. 15. Wash the night before surgery.  The night before surgery wash the foot and leg well with water and the antibacterial soap provided. Be sure to pay special attention to beneath the toenails and in between the toes.  Wash for at least three (3) minutes. Rinse thoroughly with water and dry well with a towel.  Perform this wash unless told not to do so by your physician.  Enclosed: 1 Ice pack (please put in freezer the night before surgery)   1 Hibiclens skin cleaner     Pre-op instructions  If you have any questions regarding the instructions, please do not hesitate to call our office.  [place]: [address], [place], [place] [address] -- [phone]  [place]: [address] [place], [place] [address] -- [phone]  [place]: [address]  [place], [place] [address] -- [phone]  [place]: [address], [address], [place], [place] [address] -- [phone]  Website:  [email or web address] 

## 2018-02-28 NOTE — Progress Notes (Signed)
She presents today still complaining of painful hammertoe deformities #2 #3 of the left foot.  She states they have been sticking up ever since we did surgery on them back in 2015.  She was scheduled for surgery on them again in 2016 but since then has had a major scoliosis and kyphotic back repair.  She states that the only medications that she can take pain wise is more faint and she is being followed by [facility] management Nicholaus Bloom.  She can only take morphine for pain.  Objective: Vital signs are stable she is alert oriented x3 I have reviewed her past medical history medications allergy surgeries social history review of systems.  Pulses are palpable.  Neurologic sensorium is intact deep tendon reflexes are intact.  Muscle strength is normal symmetrical.  Orthopedic evaluation demonstrates all joints distal to the ankle full range of motion without crepitation she has pain on range of motion of the second metatarsal phalangeal joints.  She states that the toes are hammertoes however there is contracted dorsally at the level of the second and third metatarsophalangeal joints due to scar tissue.  At this point the scar tissue has resulted in tight skin is well from previous osteotomy sites of the second and third metatarsals and screws.  Radiographs taken today demonstrates completely fused toes #2 #3 with internal fixation as well as internal fixation to toes metatarsals #2 #3.  Toes are in an elevated position.  Assessment: Contracted second and third metatarsophalangeal joints with painful internal fixation.  Plan: Discussed etiology pathology conservative or surgical therapies.  At this point we consented her for removal of internal fixation toes #2 and 3 and metatarsals #2 and 3 on the left foot.  Also consented her for release of the metatarsal phalangeal joint as well as pinning of the toes.  She understands this and is amenable to it she has her cam walker at home.  We did discuss  possible postop complications which may include but not limited to postop pain bleeding swelling infection recurrence need further surgery overcorrection under correction loss of digit loss of limb loss of life.

## 2018-03-01 DIAGNOSIS — R079 Chest pain, unspecified: Secondary | ICD-10-CM | POA: Diagnosis not present

## 2018-03-01 DIAGNOSIS — G894 Chronic pain syndrome: Secondary | ICD-10-CM | POA: Diagnosis not present

## 2018-03-01 DIAGNOSIS — G4700 Insomnia, unspecified: Secondary | ICD-10-CM | POA: Diagnosis not present

## 2018-03-01 DIAGNOSIS — M961 Postlaminectomy syndrome, not elsewhere classified: Secondary | ICD-10-CM | POA: Diagnosis not present

## 2018-03-02 ENCOUNTER — Ambulatory Visit (INDEPENDENT_AMBULATORY_CARE_PROVIDER_SITE_OTHER): Payer: Medicare Other | Admitting: Physician Assistant

## 2018-03-02 ENCOUNTER — Encounter: Payer: Self-pay | Admitting: Physician Assistant

## 2018-03-02 ENCOUNTER — Encounter: Payer: Self-pay | Admitting: *Deleted

## 2018-03-02 VITALS — BP 122/74 | HR 69 | Ht 67.0 in | Wt 139.0 lb

## 2018-03-02 DIAGNOSIS — E785 Hyperlipidemia, unspecified: Secondary | ICD-10-CM | POA: Diagnosis not present

## 2018-03-02 DIAGNOSIS — R0789 Other chest pain: Secondary | ICD-10-CM

## 2018-03-02 DIAGNOSIS — E039 Hypothyroidism, unspecified: Secondary | ICD-10-CM

## 2018-03-02 DIAGNOSIS — I200 Unstable angina: Secondary | ICD-10-CM

## 2018-03-02 NOTE — Progress Notes (Signed)
Cardiology Office Note    Date:  03/03/2018   ID:  Sonya Cordova, DOB [birth year], MRN [id number]  PCP:  Crecencio Mc, MD  Cardiologist:  Dr. Stanford Breed  Chief Complaint  Patient presents with  . Follow-up    seen for Dr. Stanford Breed    History of Present Illness:  Sonya Cordova is a 72 y.o. female with PMH of connective tissue disease, hypothyroidism and history of lymphedema who was recently admitted on 01/06/2018 for evaluation of chest pain.  His chest pain was felt to be atypical as they have been continuous for 3 weeks prior to arrival.  Her EKG however showed some degree of inferolateral ST depression.  Troponin is normal.  Echocardiogram performed on 01/08/2018 showed EF 55 to 60%, grade 1 DD, mild prolapse of the posterior mitral valve leaflet.  Myoview obtained on 01/09/2018 was normal with EF 55 to 65%, no perfusion abnormality.  D-dimer was elevated, CT angiogram of the chest was negative for PE, it did show emphysematous change in the lungs however no active pulmonary disease.  She also noted to have aortic atherosclerosis.  Patient presents today for cardiology office visit.  She rarely has some substernal chest discomfort by only last about 15 to 20 seconds each.  The last time she had any chest discomfort was a week ago.  It does not sound like cardiac chest pain.  She was not given cardiology follow-up on the last discharge, however her son who is a doctor told her she should follow-up with cardiology.  Her previous lipid panel obtained in November 2017 showed LDL of 102, normal cholesterol, HDL and triglyceride.  She is due for repeat lipid panel by her primary care provider.  Otherwise she has no lower extremity edema, orthopnea or PND.  She can follow-up with Dr. Stanford Breed in 6 months.   Past Medical History:  Diagnosis Date  . Anemia   . Arthritis   . Cancer (HCC)    Basal cell  . Complication of anesthesia    "states remembers surgeon talking- was light"  .  Connective tissue disease ([facility])   . Contact lens/glasses fitting    wears contacts or glasses  . Degenerative disc disease 1995  . Edema of both legs   . Family history of anesthesia complication   . Headache(784.0)    migraines  . History of blood transfusion   . Hypothyroidism   . Lymphedema   . Neuropathy   . Osteopenia 02/2012   t score -1.1  . PONV (postoperative nausea and vomiting)     Past Surgical History:  Procedure Laterality Date  . abdominal plasty    . APPENDECTOMY    . BACK SURGERY     neck,, cervical, fusion 1995-2010 ( 17 surgeries)  . BREAST ENHANCEMENT SURGERY  1978  . EYE SURGERY     as a child  . HAMMER TOE SURGERY    . hemorrhoid    . KNEE ARTHROSCOPY    . knee replace    . left elbow    . left wrist    . SPINAL CORD STIMULATOR IMPLANT  2011  . THYROIDECTOMY  1986  . TONSILLECTOMY AND ADENOIDECTOMY    . TOTAL KNEE ARTHROPLASTY     2 times right  . TOTAL KNEE ARTHROPLASTY  05/22/2012   Procedure: TOTAL KNEE ARTHROPLASTY;  Surgeon: Gearlean Alf, MD;  Location: [facility];  Service: Orthopedics;  Laterality: Left;  . TOTAL SHOULDER REPLACEMENT  2 times right shoulder  . TUBAL LIGATION    . ULNAR NERVE TRANSPOSITION Left 06/13/2013   Procedure: DECOMPRESSION, POSSIBLE TRANSPOSITION LEFT ULNAR NERVE;  Surgeon: Wynonia Sours, MD;  Location: [facility];  Service: Orthopedics;  Laterality: Left;  Marland Kitchen VAGINAL HYSTERECTOMY  1986    Current Medications: Outpatient Medications Prior to Visit  Medication Sig Dispense Refill  . azelastine (ASTELIN) 0.1 % nasal spray Place 1 spray into both nostrils 2 (two) times daily.    . Cholecalciferol (VITAMIN D3) 1000 units CAPS Take 1,000 Units by mouth at bedtime.     . diazepam (VALIUM) 5 MG tablet TAKE 1 TABLET BY MOUTH AT BEDTIME AS NEEDED FOR ANXIETY (Patient taking differently: TAKE 1 TABLET (5 MG) BY MOUTH DAILY AT BEDTIME) 30 tablet 1  . diclofenac sodium (VOLTAREN) 1 % GEL Apply 1 application  topically daily as needed (pain).    . Eszopiclone (ESZOPICLONE) 3 MG TABS Take 3 mg by mouth at bedtime. Take immediately before bedtime. Name Brand ONLY    . gabapentin (NEURONTIN) 300 MG capsule Take 600 mg by mouth 3 (three) times daily.    Marland Kitchen ibuprofen (ADVIL,MOTRIN) 200 MG tablet Take 600 mg by mouth 2 (two) times daily.    Marland Kitchen ipratropium (ATROVENT) 0.06 % nasal spray USE TWO SPRAY(S) IN EACH NOSTRIL 4 TIMES DAILY (Patient taking differently: Place 2 sprays into both nostrils 4 (four) times daily as needed for rhinitis. ) 15 mL 5  . Lactobacillus (PROBIOTIC ACIDOPHILUS PO) Take 1 capsule by mouth daily.    Marland Kitchen levothyroxine (SYNTHROID) 125 MCG tablet Take 1 tablet (125 mcg total) by mouth daily before breakfast. 90 tablet 0  . loratadine (CLARITIN) 10 MG tablet Take 10 mg by mouth daily.    . Melatonin 10 MG TABS Take 20 mg by mouth at bedtime.    . methocarbamol (ROBAXIN) 750 MG tablet Take 750-2,250 mg by mouth See admin instructions. Take 3 tablets (2250 mg) by mouth daily at bedtime, may also take 1 or 2 tablets ([id number]  mg) during the day every 6 hours as needed for muscle spasms    . montelukast (SINGULAIR) 10 MG tablet TAKE ONE TABLET BY MOUTH AT BEDTIME (Patient taking differently: TAKE ONE TABLET (10 MG) BY MOUTH AT BEDTIME) 90 tablet 2  . morphine (MSIR) 30 MG tablet Take 30 mg by mouth every 8 (eight) hours. pain    . naloxegol oxalate (MOVANTIK) 25 MG TABS tablet Take 25 mg by mouth daily.     . nitroGLYCERIN (NITROSTAT) 0.4 MG SL tablet Place 1 tablet (0.4 mg total) under the tongue every 5 (five) minutes as needed for chest pain. 50 tablet 3  . pantoprazole (PROTONIX) 40 MG tablet Take 1 tablet (40 mg total) by mouth daily. 30 tablet 2  . polyethylene glycol (MIRALAX / GLYCOLAX) packet Take 17 g by mouth daily as needed (constipation).     . Red Yeast Rice 600 MG CAPS Take 600 mg by mouth 2 (two) times daily.    Marland Kitchen tiZANidine (ZANAFLEX) 4 MG tablet Take 1 tablet (4 mg total) by  mouth every 6 (six) hours as needed for muscle spasms. 90 tablet 1  . torsemide (DEMADEX) 5 MG tablet Take 1 tablet (5 mg total) by mouth daily as needed. (Patient taking differently: Take 5 mg by mouth daily as needed (swelling/edema). ) 90 tablet 0   No facility-administered medications prior to visit.      Allergies:   Cleocin [clindamycin hcl];  Amoxicillin-pot clavulanate; and Codeine   Social History   Socioeconomic History  . Marital status: Married    Spouse name: Not on file  . Number of children: Not on file  . Years of education: Not on file  . Highest education level: Not on file  Occupational History  . Occupation: Therapist, sports- Disabled  Social Needs  . Financial resource strain: Not on file  . Food insecurity:    Worry: Not on file    Inability: Not on file  . Transportation needs:    Medical: Not on file    Non-medical: Not on file  Tobacco Use  . Smoking status: Former Smoker    Packs/day: 1.00    Years: 20.00    Pack years: 20.00    Types: Cigarettes    Last attempt to quit: 10/05/1987    Years since quitting: 30.4  . Smokeless tobacco: Never Used  Substance and Sexual Activity  . Alcohol use: No    Alcohol/week: 0.0 oz  . Drug use: No  . Sexual activity: Yes    Partners: Male    Birth control/protection: Post-menopausal  Lifestyle  . Physical activity:    Days per week: Not on file    Minutes per session: Not on file  . Stress: Not on file  Relationships  . Social connections:    Talks on phone: Not on file    Gets together: Not on file    Attends religious service: Not on file    Active member of club or organization: Not on file    Attends meetings of clubs or organizations: Not on file    Relationship status: Not on file  Other Topics Concern  . Not on file  Social History Narrative  . Not on file     Family History:  The patient's family history includes Heart disease in her father, mother, and sister; Hyperlipidemia in her mother; Hypertension  in her brother, father, mother, sister, and sister; Osteoarthritis in her brother, father, and sister; Pancreatic cancer in her mother; Rheum arthritis in her mother; Skin cancer in her father, mother, and sister; Stroke in her sister; Thyroid disease in her sister.   ROS:   Please see the history of present illness.    ROS All other systems reviewed and are negative.   PHYSICAL EXAM:   VS:  BP 122/74   Pulse 69   Ht 5\' 7"  (1.702 m)   Wt 139 lb (63 kg)   SpO2 96%   BMI 21.77 kg/m    GEN: Well nourished, well developed, in no acute distress  HEENT: normal  Neck: no JVD, carotid bruits, or masses Cardiac: RRR; no murmurs, rubs, or gallops,no edema  Respiratory:  clear to auscultation bilaterally, normal work of breathing GI: soft, nontender, nondistended, + BS MS: no deformity or atrophy  Skin: warm and dry, no rash Neuro:  Alert and Oriented x 3, Strength and sensation are intact Psych: euthymic mood, full affect  Wt Readings from Last 3 Encounters:  03/02/18 139 lb (63 kg)  01/13/18 137 lb 6.4 oz (62.3 kg)  01/09/18 133 lb 1.6 oz (60.4 kg)      Studies/Labs Reviewed:   EKG:  EKG is not ordered today.   Recent Labs: 01/06/2018: ALT 17; B Natriuretic Peptide 43.1; BUN 30; Creatinine, Ser 0.93; Hemoglobin 12.7; Magnesium 1.8; Platelets 161; Potassium 3.8; Sodium 142; TSH 1.88   Lipid Panel    Component Value Date/Time   CHOL 189 08/31/2016 1007  TRIG 65.0 08/31/2016 1007   HDL 73.70 08/31/2016 1007   CHOLHDL 3 08/31/2016 1007   VLDL 13.0 08/31/2016 1007   LDLCALC 102 (H) 08/31/2016 1007   LDLDIRECT 122.0 06/14/2017 1541    Additional studies/ records that were reviewed today include:   Echo 01/08/2018 LV EF: 55% -   60% Study Conclusions  - Left ventricle: The cavity size was normal. Wall thickness was   normal. Systolic function was normal. The estimated ejection   fraction was in the range of 55% to 60%. Wall motion was normal;   there were no regional wall  motion abnormalities. Doppler   parameters are consistent with abnormal left ventricular   relaxation (grade 1 diastolic dysfunction). - Mitral valve: Mild prolapse, involving the posterior leaflet.  Impressions:  - Normal LV systolic function; mild diastolic dysfunction; mild   prolapse of posterior MV leaflet.   Myoview 01/09/2018  T wave inversion was noted during stress.  The study is normal.  This is a low risk study.  The left ventricular ejection fraction is normal (55-65%).       ASSESSMENT:    1. Atypical chest pain   2. Hypothyroidism, unspecified type   3. Hyperlipidemia, unspecified hyperlipidemia type      PLAN:  In order of problems listed above:  1. Atypical chest pain: Previous echocardiogram and Myoview in April 2019 was negative.  Her chest pain only last about 15 to 20 seconds and it does not associated with exertion.  I recommend continue observation for now.  2. Hypothyroidism: Managed by primary care provider  3. Hyperlipidemia: She is due for repeat fasting lipid panel, I will defer this to primary care provider.  Her previous LDL was borderline high in 2017.    Medication Adjustments/Labs and Tests Ordered: Current medicines are reviewed at length with the patient today.  Concerns regarding medicines are outlined above.  Medication changes, Labs and Tests ordered today are listed in the Patient Instructions below. Patient Instructions  Medication Instructions: Your physician recommends that you continue on your current medications as directed. Please refer to the Current Medication list given to you today.  If you need a refill on your cardiac medications before your next appointment, please call your pharmacy.   Follow-Up: Your physician wants you to follow-up in 6 months with Dr. Stanford Breed. You will receive a reminder letter in the mail two months in advance. If you don't receive a letter, please call our office at [phone] to schedule  this follow-up appointment.   Thank you for choosing [facility], [place], [place]  03/03/2018 9:56 AM    [address] [place], [place], [place]  [address] Phone: [phone]; Fax: [phone]

## 2018-03-02 NOTE — Patient Instructions (Signed)
Medication Instructions: Your physician recommends that you continue on your current medications as directed. Please refer to the Current Medication list given to you today.  If you need a refill on your cardiac medications before your next appointment, please call your pharmacy.   Follow-Up: Your physician wants you to follow-up in 6 months with Dr. Crenshaw. You will receive a reminder letter in the mail two months in advance. If you don't receive a letter, please call our office at [phone] to schedule this follow-up appointment.   Thank you for choosing [facility]!!      

## 2018-03-03 ENCOUNTER — Encounter: Payer: Self-pay | Admitting: Physician Assistant

## 2018-03-06 ENCOUNTER — Telehealth: Payer: Self-pay | Admitting: Internal Medicine

## 2018-03-06 NOTE — Telephone Encounter (Signed)
Copied from [place] [id number]. Topic: Quick Communication - Rx Refill/Question >> Mar 06, 2018  2:20 PM Synthia Innocent wrote: Medication: levothyroxine (SYNTHROID) 125 MCG tablet  Has the patient contacted their pharmacy? Yes, they have faxed request (Agent: If no, request that the patient contact the pharmacy for the refill.) (Agent: If yes, when and what did the pharmacy advise?)  Preferred Pharmacy (with phone number or street name): [facility] [place]: Please be advised that RX refills may take up to 3 business days. We ask that you follow-up with your pharmacy.

## 2018-03-07 ENCOUNTER — Telehealth: Payer: Self-pay | Admitting: Internal Medicine

## 2018-03-07 NOTE — Telephone Encounter (Signed)
Pt is calling checking on status of med refill. Pt didn't realize she didn't have any refills left and she is out of the medication. She contacted the pharmacy on Saturday.

## 2018-03-07 NOTE — Telephone Encounter (Incomplete)
Copied from [place] [id number]. Topic: Quick Communication - Rx Refill/Question >> Mar 07, 2018  2:34 PM Aurelio Brash B wrote: Medication:levothyroxine (SYNTHROID) 125 MCG tablet  Has the patient contacted their pharmacy? {yes [id number]} (Agent: If no, request that the patient contact the pharmacy for the refill.) (Agent: If yes, when and what did the pharmacy advise?)  Preferred Pharmacy (with phone number or street name): [id number] [place], [place] [phone] (Phone) [phone] (Fax)      Agent: Please be advised that RX refills may take up to 3 business days. We ask that you follow-up with your pharmacy.

## 2018-03-08 ENCOUNTER — Ambulatory Visit
Admission: RE | Admit: 2018-03-08 | Discharge: 2018-03-08 | Disposition: A | Discharge status: Home / Self Care | Payer: Medicare Other | Source: Ambulatory Visit | Attending: Internal Medicine | Admitting: Internal Medicine

## 2018-03-08 DIAGNOSIS — Z1231 Encounter for screening mammogram for malignant neoplasm of breast: Secondary | ICD-10-CM | POA: Diagnosis not present

## 2018-03-08 MED ORDER — LEVOTHYROXINE SODIUM 125 MCG PO TABS: 125.0000 ug | ORAL_TABLET | Freq: Every day | ORAL | 0 refills | Status: DC

## 2018-03-08 NOTE — Telephone Encounter (Signed)
Medication filled on 6/5

## 2018-03-08 NOTE — Telephone Encounter (Signed)
Refill request Levothyroxine 125 MCG tab LOV 01/13/18 medication addressed. TSH on 10/12/17

## 2018-03-17 ENCOUNTER — Telehealth: Payer: Self-pay | Admitting: Internal Medicine

## 2018-03-17 DIAGNOSIS — M542 Cervicalgia: Secondary | ICD-10-CM

## 2018-03-17 NOTE — Telephone Encounter (Signed)
Patient fell in shower 1 1/2 weeks ago, has been having neck pain and stiffness since fall patient has been using ice and heat and neck pain is no better but no worse refused any other appointment but with PCP scheduled 03/24/18 with PCP, patient ask if she could come first of the week for cervical X-ray?

## 2018-03-17 NOTE — Telephone Encounter (Unsigned)
Copied from [place] [id number]. Topic: Quick Communication - See Telephone Encounter >> Mar 17, 2018  2:33 PM Neva Seat wrote: Pt had a fall in the shower 1 1/2 weeks ago.  Pt's neck has been in pain with headaches. Pt isn't getting better even after taking ibuprofen that usually helps w/ her neck arthritis. Pt is asking if Dr. Derrel Nip thinks she needs an Xray on her neck and head area. Dr. Derrel Nip doesn't have any availabilty until July. Pt cannont wait that long to be seen.

## 2018-03-17 NOTE — Telephone Encounter (Signed)
Patient will come in on Monday

## 2018-03-17 NOTE — Telephone Encounter (Signed)
ORDERED

## 2018-03-20 ENCOUNTER — Ambulatory Visit (INDEPENDENT_AMBULATORY_CARE_PROVIDER_SITE_OTHER): Payer: Medicare Other

## 2018-03-20 DIAGNOSIS — M542 Cervicalgia: Secondary | ICD-10-CM | POA: Diagnosis not present

## 2018-03-22 ENCOUNTER — Ambulatory Visit (INDEPENDENT_AMBULATORY_CARE_PROVIDER_SITE_OTHER): Payer: Medicare Other | Admitting: Internal Medicine

## 2018-03-22 ENCOUNTER — Encounter: Payer: Self-pay | Admitting: Internal Medicine

## 2018-03-22 DIAGNOSIS — G8929 Other chronic pain: Secondary | ICD-10-CM | POA: Diagnosis not present

## 2018-03-22 DIAGNOSIS — M25552 Pain in left hip: Secondary | ICD-10-CM | POA: Diagnosis not present

## 2018-03-22 DIAGNOSIS — I200 Unstable angina: Secondary | ICD-10-CM | POA: Diagnosis not present

## 2018-03-22 DIAGNOSIS — M542 Cervicalgia: Secondary | ICD-10-CM | POA: Diagnosis not present

## 2018-03-22 DIAGNOSIS — G894 Chronic pain syndrome: Secondary | ICD-10-CM | POA: Diagnosis not present

## 2018-03-22 DIAGNOSIS — M25551 Pain in right hip: Secondary | ICD-10-CM | POA: Diagnosis not present

## 2018-03-22 MED ORDER — CELECOXIB 200 MG PO CAPS: 200.0000 mg | ORAL_CAPSULE | Freq: Every day | ORAL | 5 refills | Status: DC

## 2018-03-22 NOTE — Progress Notes (Signed)
Subjective:  Patient ID: Sonya Cordova, female    DOB: 05-17-1946  Age: 72 y.o. MRN: [id number]  CC: Diagnoses of Musculoskeletal neck pain and Other chronic pain were pertinent to this visit.  HPI Sonya Cordova presents for evaluation of new onset hip pain following a fall several weeks ago  That occurred after getting up from the commode.  She states that suddenly bth knees locked , she was unable to maintain her balance and fell iinto the shower stall and hit the ceramic frame of the shower stall. .   She initially noted right hp pain aggravated by walking , but the pain has improved. She continues to have neck and back pain   She has chronic pain due to DDD of spine and DHD  of both knees..  Both knees have been replaced ,  The right one twice (last one by Aluicio), and she currently  has tendonitis in right knee .  Has appt for aquatic therapy (ordered by Aluicio) at a facility in [place] on [employer]    Outpatient Medications Prior to Visit  Medication Sig Dispense Refill  . azelastine (ASTELIN) 0.1 % nasal spray Place 1 spray into both nostrils 2 (two) times daily.    . Cholecalciferol (VITAMIN D3) 1000 units CAPS Take 1,000 Units by mouth at bedtime.     . diazepam (VALIUM) 5 MG tablet TAKE 1 TABLET BY MOUTH AT BEDTIME AS NEEDED FOR ANXIETY (Patient taking differently: TAKE 1 TABLET (5 MG) BY MOUTH DAILY AT BEDTIME) 30 tablet 1  . diclofenac sodium (VOLTAREN) 1 % GEL Apply 1 application topically daily as needed (pain).    . Eszopiclone (ESZOPICLONE) 3 MG TABS Take 3 mg by mouth at bedtime. Take immediately before bedtime. Name Brand ONLY    . gabapentin (NEURONTIN) 300 MG capsule Take 600 mg by mouth 3 (three) times daily.    Marland Kitchen ibuprofen (ADVIL,MOTRIN) 200 MG tablet Take 600 mg by mouth 2 (two) times daily.    Marland Kitchen ipratropium (ATROVENT) 0.06 % nasal spray USE TWO SPRAY(S) IN EACH NOSTRIL 4 TIMES DAILY (Patient taking differently: Place 2 sprays into both nostrils 4  (four) times daily as needed for rhinitis. ) 15 mL 5  . Lactobacillus (PROBIOTIC ACIDOPHILUS PO) Take 1 capsule by mouth daily.    Marland Kitchen levothyroxine (SYNTHROID) 125 MCG tablet Take 1 tablet (125 mcg total) by mouth daily before breakfast. 90 tablet 0  . loratadine (CLARITIN) 10 MG tablet Take 10 mg by mouth daily.    . Melatonin 10 MG TABS Take 20 mg by mouth at bedtime.    . methocarbamol (ROBAXIN) 750 MG tablet Take 750-2,250 mg by mouth See admin instructions. Take 3 tablets (2250 mg) by mouth daily at bedtime, may also take 1 or 2 tablets ([id number]  mg) during the day every 6 hours as needed for muscle spasms    . montelukast (SINGULAIR) 10 MG tablet TAKE ONE TABLET BY MOUTH AT BEDTIME (Patient taking differently: TAKE ONE TABLET (10 MG) BY MOUTH AT BEDTIME) 90 tablet 2  . morphine (MSIR) 30 MG tablet Take 30 mg by mouth every 8 (eight) hours. pain    . naloxegol oxalate (MOVANTIK) 25 MG TABS tablet Take 25 mg by mouth daily.     . nitroGLYCERIN (NITROSTAT) 0.4 MG SL tablet Place 1 tablet (0.4 mg total) under the tongue every 5 (five) minutes as needed for chest pain. 50 tablet 3  . polyethylene glycol (MIRALAX / GLYCOLAX) packet Take  17 g by mouth daily as needed (constipation).     . Red Yeast Rice 600 MG CAPS Take 600 mg by mouth 2 (two) times daily.    Marland Kitchen tiZANidine (ZANAFLEX) 4 MG tablet Take 1 tablet (4 mg total) by mouth every 6 (six) hours as needed for muscle spasms. 90 tablet 1  . torsemide (DEMADEX) 5 MG tablet Take 1 tablet (5 mg total) by mouth daily as needed. (Patient taking differently: Take 5 mg by mouth daily as needed (swelling/edema). ) 90 tablet 0  . pantoprazole (PROTONIX) 40 MG tablet Take 1 tablet (40 mg total) by mouth daily. (Patient not taking: Reported on 03/22/2018) 30 tablet 2   No facility-administered medications prior to visit.     Review of Systems;  Patient denies headache, fevers, malaise, unintentional weight loss, skin rash, eye pain, sinus congestion and  sinus pain, sore throat, dysphagia,  hemoptysis , cough, dyspnea, wheezing, chest pain, palpitations, orthopnea, edema, abdominal pain, nausea, melena, diarrhea, constipation, flank pain, dysuria, hematuria, urinary  Frequency, nocturia, numbness, tingling, seizures,  Focal weakness, Loss of consciousness,  Tremor, insomnia, depression, anxiety, and suicidal ideation.      Objective:  BP 114/62 (BP Location: Left Arm, Patient Position: Sitting, Cuff Size: Normal)   Pulse 77   Temp 98.2 F (36.8 C) (Oral)   Resp 16   Ht 5\' 7"  (1.702 m)   Wt 138 lb 12.8 oz (63 kg)   SpO2 96%   BMI 21.74 kg/m   BP Readings from Last 3 Encounters:  03/22/18 114/62  03/02/18 122/74  01/13/18 118/62    Wt Readings from Last 3 Encounters:  03/22/18 138 lb 12.8 oz (63 kg)  03/02/18 139 lb (63 kg)  01/13/18 137 lb 6.4 oz (62.3 kg)    General appearance: alert, cooperative and appears stated age Neck: no adenopathy, no carotid bruit, supple, symmetrical, trachea midline and thyroid not enlarged, symmetric, no cervical spine tenderness Back: symmetric, no curvature. ROM normal. No vertebral or CVA tenderness. Lungs: clear to auscultation bilaterally Heart: regular rate and rhythm, S1, S2 normal, no murmur, click, rub or gallop Abdomen: soft, non-tender; bowel sounds normal; no masses,  no organomegaly Pulses: 2+ and symmetric Skin: Skin color, texture, turgor normal. No rashes or lesions MSK:  full ROM both hips,  Can stand on right leg.   Lymph nodes: Cervical, supraclavicular, and axillary nodes normal.    Assessment & Plan:   Problem List Items Addressed This Visit    Musculoskeletal neck pain    Plain films were ordered afer her recent fall to evaluate and rule out hardware from prior fusion.  No damage was seen and alignement was normal.        Chronic pain    Adding once daily celebrex ,  And advised to resume protonix for GI prophylaxis       Relevant Medications   celecoxib  (CELEBREX) 200 MG capsule     A total of 25 minutes of face to face time was spent with patient more than half of which was spent in counselling about the above mentioned conditions  and coordination of care  I am having Dion Saucier. Totino start on celecoxib. I am also having her maintain her morphine, polyethylene glycol, Eszopiclone, naloxegol oxalate, Red Yeast Rice, Lactobacillus (PROBIOTIC ACIDOPHILUS PO), Vitamin D3, torsemide, montelukast, tiZANidine, ipratropium, diazepam, gabapentin, methocarbamol, Melatonin, ibuprofen, loratadine, azelastine, diclofenac sodium, pantoprazole, nitroGLYCERIN, and levothyroxine.  Meds ordered this encounter  Medications  . celecoxib (CELEBREX) 200 MG  capsule    Sig: Take 1 capsule (200 mg total) by mouth daily.    Dispense:  30 capsule    Refill:  5    There are no discontinued medications.  Follow-up: No follow-ups on file.   Crecencio Mc, MD

## 2018-03-22 NOTE — Patient Instructions (Signed)
Trial of celeberex 200 mg dilay  Get back on protonix to GI protection

## 2018-03-24 ENCOUNTER — Ambulatory Visit: Payer: Medicare Other | Admitting: Internal Medicine

## 2018-03-25 ENCOUNTER — Encounter: Payer: Self-pay | Admitting: Internal Medicine

## 2018-03-25 DIAGNOSIS — M542 Cervicalgia: Secondary | ICD-10-CM | POA: Insufficient documentation

## 2018-03-25 NOTE — Assessment & Plan Note (Signed)
Adding once daily celebrex ,  And advised to resume protonix for GI prophylaxis

## 2018-03-25 NOTE — Assessment & Plan Note (Signed)
Plain films were ordered afer her recent fall to evaluate and rule out hardware from prior fusion.  No damage was seen and alignement was normal.

## 2018-04-05 ENCOUNTER — Other Ambulatory Visit: Payer: Self-pay | Admitting: Podiatry

## 2018-04-05 ENCOUNTER — Ambulatory Visit: Payer: Medicare Other | Admitting: Internal Medicine

## 2018-04-07 ENCOUNTER — Telehealth: Payer: Self-pay | Admitting: Internal Medicine

## 2018-04-07 DIAGNOSIS — Z472 Encounter for removal of internal fixation device: Secondary | ICD-10-CM | POA: Diagnosis not present

## 2018-04-07 DIAGNOSIS — E039 Hypothyroidism, unspecified: Secondary | ICD-10-CM | POA: Diagnosis not present

## 2018-04-07 DIAGNOSIS — Z4889 Encounter for other specified surgical aftercare: Secondary | ICD-10-CM | POA: Diagnosis not present

## 2018-04-07 DIAGNOSIS — T8484XA Pain due to internal orthopedic prosthetic devices, implants and grafts, initial encounter: Secondary | ICD-10-CM | POA: Diagnosis not present

## 2018-04-07 DIAGNOSIS — M7752 Other enthesopathy of left foot: Secondary | ICD-10-CM | POA: Diagnosis not present

## 2018-04-07 DIAGNOSIS — M24575 Contracture, left foot: Secondary | ICD-10-CM | POA: Diagnosis not present

## 2018-04-07 NOTE — Telephone Encounter (Signed)
Does pt need to keep her follow up appt on 04/14/2018? Pt stated that she is feeling fine.

## 2018-04-07 NOTE — Telephone Encounter (Signed)
Copied from [place] [id number]. Topic: Quick Communication - See Telephone Encounter >> Apr 07, 2018  3:26 PM Burchel, Abbi R wrote: See Telephone encounter for: 04/07/18.  Pt received a reminder msg about her upcoming 7mo f/up appt on 7/12.  Pt states she is feeling fine, and would like to know if she needs to keep this appt.  Please advise.   Pt:[phone]

## 2018-04-08 NOTE — Telephone Encounter (Signed)
No she does not need to keep the July 12 appt

## 2018-04-10 NOTE — Telephone Encounter (Signed)
LMTCB. Need to let pt know that she does not have to keep her appt on 04/14/2018 and then cancel the appt. PEC may speak with pt.

## 2018-04-11 NOTE — Telephone Encounter (Signed)
Spoke with pt to let her know that she does not need to keep her appt on 04/14/2018. Pt gave a verbal understanding. Appt has been canceled.

## 2018-04-12 DIAGNOSIS — G894 Chronic pain syndrome: Secondary | ICD-10-CM | POA: Diagnosis not present

## 2018-04-12 DIAGNOSIS — G4700 Insomnia, unspecified: Secondary | ICD-10-CM | POA: Diagnosis not present

## 2018-04-12 DIAGNOSIS — R079 Chest pain, unspecified: Secondary | ICD-10-CM | POA: Diagnosis not present

## 2018-04-12 DIAGNOSIS — M961 Postlaminectomy syndrome, not elsewhere classified: Secondary | ICD-10-CM | POA: Diagnosis not present

## 2018-04-13 ENCOUNTER — Ambulatory Visit (INDEPENDENT_AMBULATORY_CARE_PROVIDER_SITE_OTHER): Payer: Self-pay | Admitting: Podiatry

## 2018-04-13 ENCOUNTER — Ambulatory Visit (INDEPENDENT_AMBULATORY_CARE_PROVIDER_SITE_OTHER): Payer: Medicare Other

## 2018-04-13 VITALS — BP 115/66 | HR 66 | Temp 96.6°F

## 2018-04-13 DIAGNOSIS — T847XXS Infection and inflammatory reaction due to other internal orthopedic prosthetic devices, implants and grafts, sequela: Secondary | ICD-10-CM

## 2018-04-13 NOTE — Progress Notes (Signed)
Subjective:   Patient ID: Sonya Cordova, female   DOB: 72 y.o.   MRN: [id number]   HPI Patient presents stating she is doing very well with surgery   ROS      Objective:  Physical Exam  Neurovascular status intact negative Homans sign noted with good healing surgical sites left second and third metatarsal and distal digits where fixation has been removed by Dr. Milinda Pointer     Assessment:  Doing well post fixation removal left     Plan:  X-ray confirmed good removal of fixation sterile dressing reapplied today continued elevation immobilization recommended and reappoint for suture removal in the next 2 weeks.  Encouraged to call with any questions prior if any issues should arise  X-rays indicate satisfactory removal of fixation with good alignment noted of the underlying metatarsal phalangeal joint

## 2018-04-14 ENCOUNTER — Ambulatory Visit: Payer: Medicare Other | Admitting: Internal Medicine

## 2018-04-20 ENCOUNTER — Ambulatory Visit (INDEPENDENT_AMBULATORY_CARE_PROVIDER_SITE_OTHER): Payer: Self-pay | Admitting: Podiatry

## 2018-04-20 DIAGNOSIS — T847XXS Infection and inflammatory reaction due to other internal orthopedic prosthetic devices, implants and grafts, sequela: Secondary | ICD-10-CM

## 2018-04-22 NOTE — Progress Notes (Signed)
She presents today for follow-up of her metatarsal phalangeal joint release and removal of internal fixation digits 2 and 3 of the left foot.  She tolerated that procedure well and states that she is very happy with outcome.  Objective: Vital signs temp she is alert and oriented x3 no erythema edema saline strange odor incision sites appear to go on to heal uneventfully.  Assessment: Well-healing surgical toes.  Plan: Sutures were removed today follow-up with her in a couple of weeks.

## 2018-04-28 DIAGNOSIS — Z96651 Presence of right artificial knee joint: Secondary | ICD-10-CM | POA: Diagnosis not present

## 2018-04-28 DIAGNOSIS — M7061 Trochanteric bursitis, right hip: Secondary | ICD-10-CM | POA: Diagnosis not present

## 2018-04-28 DIAGNOSIS — Z471 Aftercare following joint replacement surgery: Secondary | ICD-10-CM | POA: Diagnosis not present

## 2018-04-28 DIAGNOSIS — M7060 Trochanteric bursitis, unspecified hip: Secondary | ICD-10-CM | POA: Diagnosis not present

## 2018-04-28 DIAGNOSIS — M1611 Unilateral primary osteoarthritis, right hip: Secondary | ICD-10-CM | POA: Diagnosis not present

## 2018-04-28 DIAGNOSIS — M1711 Unilateral primary osteoarthritis, right knee: Secondary | ICD-10-CM | POA: Diagnosis not present

## 2018-05-03 ENCOUNTER — Other Ambulatory Visit: Payer: Self-pay | Admitting: Internal Medicine

## 2018-05-03 DIAGNOSIS — M25552 Pain in left hip: Secondary | ICD-10-CM | POA: Diagnosis not present

## 2018-05-03 DIAGNOSIS — M25551 Pain in right hip: Secondary | ICD-10-CM | POA: Diagnosis not present

## 2018-05-03 DIAGNOSIS — G894 Chronic pain syndrome: Secondary | ICD-10-CM | POA: Diagnosis not present

## 2018-05-04 ENCOUNTER — Ambulatory Visit (INDEPENDENT_AMBULATORY_CARE_PROVIDER_SITE_OTHER): Payer: Medicare Other | Admitting: Podiatry

## 2018-05-04 ENCOUNTER — Encounter: Payer: Self-pay | Admitting: Podiatry

## 2018-05-04 ENCOUNTER — Encounter: Payer: Medicare Other | Admitting: Podiatry

## 2018-05-04 DIAGNOSIS — T847XXS Infection and inflammatory reaction due to other internal orthopedic prosthetic devices, implants and grafts, sequela: Secondary | ICD-10-CM

## 2018-05-05 DIAGNOSIS — G894 Chronic pain syndrome: Secondary | ICD-10-CM | POA: Diagnosis not present

## 2018-05-05 DIAGNOSIS — M25552 Pain in left hip: Secondary | ICD-10-CM | POA: Diagnosis not present

## 2018-05-05 DIAGNOSIS — M25551 Pain in right hip: Secondary | ICD-10-CM | POA: Diagnosis not present

## 2018-05-06 NOTE — Progress Notes (Signed)
She presents today after surgery stating that my toes really look about the same as they did before the surgery.  Objective: Vital signs are stable she is alert and oriented x3.  Pulses are palpable.  We reviewed previous radiographs as opposed to radiographs taken today and evaluations demonstrating that the toes are laying down nice and flat at this point time she states that this still seem to rub the top of her shoe I expressed to her that her shoes were very tight and that they do not have a very deep toe box.  Recommended a higher toe box she understands this.  Assessment: I encouraged her to continue to stretch the skin on the toes and keep the toes pulled down.  Assessment plan: She will continue to stretch the soft tissue with the Darco digital splint I will follow-up with her as needed

## 2018-05-09 DIAGNOSIS — M25552 Pain in left hip: Secondary | ICD-10-CM | POA: Diagnosis not present

## 2018-05-09 DIAGNOSIS — G894 Chronic pain syndrome: Secondary | ICD-10-CM | POA: Diagnosis not present

## 2018-05-09 DIAGNOSIS — M25551 Pain in right hip: Secondary | ICD-10-CM | POA: Diagnosis not present

## 2018-05-11 DIAGNOSIS — M25552 Pain in left hip: Secondary | ICD-10-CM | POA: Diagnosis not present

## 2018-05-11 DIAGNOSIS — G894 Chronic pain syndrome: Secondary | ICD-10-CM | POA: Diagnosis not present

## 2018-05-11 DIAGNOSIS — M25551 Pain in right hip: Secondary | ICD-10-CM | POA: Diagnosis not present

## 2018-05-16 ENCOUNTER — Encounter: Payer: Medicare Other | Admitting: Podiatry

## 2018-05-19 DIAGNOSIS — M25551 Pain in right hip: Secondary | ICD-10-CM | POA: Diagnosis not present

## 2018-05-19 DIAGNOSIS — M25552 Pain in left hip: Secondary | ICD-10-CM | POA: Diagnosis not present

## 2018-05-19 DIAGNOSIS — G894 Chronic pain syndrome: Secondary | ICD-10-CM | POA: Diagnosis not present

## 2018-05-22 DIAGNOSIS — M25552 Pain in left hip: Secondary | ICD-10-CM | POA: Diagnosis not present

## 2018-05-22 DIAGNOSIS — M25551 Pain in right hip: Secondary | ICD-10-CM | POA: Diagnosis not present

## 2018-05-22 DIAGNOSIS — G894 Chronic pain syndrome: Secondary | ICD-10-CM | POA: Diagnosis not present

## 2018-05-24 DIAGNOSIS — M961 Postlaminectomy syndrome, not elsewhere classified: Secondary | ICD-10-CM | POA: Diagnosis not present

## 2018-05-24 DIAGNOSIS — G894 Chronic pain syndrome: Secondary | ICD-10-CM | POA: Diagnosis not present

## 2018-05-24 DIAGNOSIS — G4700 Insomnia, unspecified: Secondary | ICD-10-CM | POA: Diagnosis not present

## 2018-05-24 DIAGNOSIS — M25551 Pain in right hip: Secondary | ICD-10-CM | POA: Diagnosis not present

## 2018-05-24 DIAGNOSIS — M25552 Pain in left hip: Secondary | ICD-10-CM | POA: Diagnosis not present

## 2018-05-24 DIAGNOSIS — R079 Chest pain, unspecified: Secondary | ICD-10-CM | POA: Diagnosis not present

## 2018-05-30 DIAGNOSIS — M25552 Pain in left hip: Secondary | ICD-10-CM | POA: Diagnosis not present

## 2018-05-30 DIAGNOSIS — G894 Chronic pain syndrome: Secondary | ICD-10-CM | POA: Diagnosis not present

## 2018-05-30 DIAGNOSIS — M25551 Pain in right hip: Secondary | ICD-10-CM | POA: Diagnosis not present

## 2018-05-30 DIAGNOSIS — M25562 Pain in left knee: Secondary | ICD-10-CM | POA: Diagnosis not present

## 2018-05-31 ENCOUNTER — Other Ambulatory Visit: Payer: Self-pay | Admitting: Internal Medicine

## 2018-06-01 DIAGNOSIS — M25562 Pain in left knee: Secondary | ICD-10-CM | POA: Diagnosis not present

## 2018-06-01 DIAGNOSIS — M25551 Pain in right hip: Secondary | ICD-10-CM | POA: Diagnosis not present

## 2018-06-01 DIAGNOSIS — M25552 Pain in left hip: Secondary | ICD-10-CM | POA: Diagnosis not present

## 2018-06-01 DIAGNOSIS — G894 Chronic pain syndrome: Secondary | ICD-10-CM | POA: Diagnosis not present

## 2018-06-05 ENCOUNTER — Other Ambulatory Visit: Payer: Self-pay | Admitting: Internal Medicine

## 2018-06-06 DIAGNOSIS — M25551 Pain in right hip: Secondary | ICD-10-CM | POA: Diagnosis not present

## 2018-06-06 DIAGNOSIS — M25562 Pain in left knee: Secondary | ICD-10-CM | POA: Diagnosis not present

## 2018-06-06 DIAGNOSIS — M25552 Pain in left hip: Secondary | ICD-10-CM | POA: Diagnosis not present

## 2018-06-06 DIAGNOSIS — G894 Chronic pain syndrome: Secondary | ICD-10-CM | POA: Diagnosis not present

## 2018-06-06 NOTE — Telephone Encounter (Signed)
Refilled: 01/04/2018 Last OV: 03/22/2018 Next OV: not scheduled

## 2018-06-07 DIAGNOSIS — Z85828 Personal history of other malignant neoplasm of skin: Secondary | ICD-10-CM | POA: Diagnosis not present

## 2018-06-07 DIAGNOSIS — D1801 Hemangioma of skin and subcutaneous tissue: Secondary | ICD-10-CM | POA: Diagnosis not present

## 2018-06-07 DIAGNOSIS — L821 Other seborrheic keratosis: Secondary | ICD-10-CM | POA: Diagnosis not present

## 2018-06-07 DIAGNOSIS — D225 Melanocytic nevi of trunk: Secondary | ICD-10-CM | POA: Diagnosis not present

## 2018-06-07 DIAGNOSIS — L814 Other melanin hyperpigmentation: Secondary | ICD-10-CM | POA: Diagnosis not present

## 2018-06-07 DIAGNOSIS — L309 Dermatitis, unspecified: Secondary | ICD-10-CM | POA: Diagnosis not present

## 2018-06-08 DIAGNOSIS — M25551 Pain in right hip: Secondary | ICD-10-CM | POA: Diagnosis not present

## 2018-06-08 DIAGNOSIS — M25562 Pain in left knee: Secondary | ICD-10-CM | POA: Diagnosis not present

## 2018-06-08 DIAGNOSIS — G894 Chronic pain syndrome: Secondary | ICD-10-CM | POA: Diagnosis not present

## 2018-06-08 DIAGNOSIS — M25552 Pain in left hip: Secondary | ICD-10-CM | POA: Diagnosis not present

## 2018-06-10 DIAGNOSIS — Z23 Encounter for immunization: Secondary | ICD-10-CM | POA: Diagnosis not present

## 2018-06-13 DIAGNOSIS — M25551 Pain in right hip: Secondary | ICD-10-CM | POA: Diagnosis not present

## 2018-06-13 DIAGNOSIS — M25562 Pain in left knee: Secondary | ICD-10-CM | POA: Diagnosis not present

## 2018-06-13 DIAGNOSIS — M25552 Pain in left hip: Secondary | ICD-10-CM | POA: Diagnosis not present

## 2018-06-13 DIAGNOSIS — G894 Chronic pain syndrome: Secondary | ICD-10-CM | POA: Diagnosis not present

## 2018-06-15 DIAGNOSIS — G894 Chronic pain syndrome: Secondary | ICD-10-CM | POA: Diagnosis not present

## 2018-06-15 DIAGNOSIS — M25552 Pain in left hip: Secondary | ICD-10-CM | POA: Diagnosis not present

## 2018-06-15 DIAGNOSIS — M25551 Pain in right hip: Secondary | ICD-10-CM | POA: Diagnosis not present

## 2018-06-15 DIAGNOSIS — M25562 Pain in left knee: Secondary | ICD-10-CM | POA: Diagnosis not present

## 2018-06-20 DIAGNOSIS — M25551 Pain in right hip: Secondary | ICD-10-CM | POA: Diagnosis not present

## 2018-06-20 DIAGNOSIS — M25552 Pain in left hip: Secondary | ICD-10-CM | POA: Diagnosis not present

## 2018-06-20 DIAGNOSIS — M25562 Pain in left knee: Secondary | ICD-10-CM | POA: Diagnosis not present

## 2018-06-20 DIAGNOSIS — G894 Chronic pain syndrome: Secondary | ICD-10-CM | POA: Diagnosis not present

## 2018-06-21 DIAGNOSIS — R079 Chest pain, unspecified: Secondary | ICD-10-CM | POA: Diagnosis not present

## 2018-06-21 DIAGNOSIS — G4700 Insomnia, unspecified: Secondary | ICD-10-CM | POA: Diagnosis not present

## 2018-06-21 DIAGNOSIS — M961 Postlaminectomy syndrome, not elsewhere classified: Secondary | ICD-10-CM | POA: Diagnosis not present

## 2018-06-21 DIAGNOSIS — G894 Chronic pain syndrome: Secondary | ICD-10-CM | POA: Diagnosis not present

## 2018-07-09 ENCOUNTER — Other Ambulatory Visit: Payer: Self-pay | Admitting: Internal Medicine

## 2018-07-11 ENCOUNTER — Other Ambulatory Visit: Payer: Self-pay | Admitting: Internal Medicine

## 2018-07-11 ENCOUNTER — Telehealth: Payer: Self-pay

## 2018-07-11 NOTE — Telephone Encounter (Signed)
Copied from [place] [id number]. Topic: Inquiry >> Jul 11, 2018  3:56 PM Sheran Luz wrote: Reason for CRM: Pt would like to know if/when she needs to get a 2nd shingles shot.

## 2018-07-18 DIAGNOSIS — Z01818 Encounter for other preprocedural examination: Secondary | ICD-10-CM | POA: Diagnosis not present

## 2018-07-18 DIAGNOSIS — H25812 Combined forms of age-related cataract, left eye: Secondary | ICD-10-CM | POA: Diagnosis not present

## 2018-07-18 DIAGNOSIS — H25811 Combined forms of age-related cataract, right eye: Secondary | ICD-10-CM | POA: Diagnosis not present

## 2018-07-18 DIAGNOSIS — H52223 Regular astigmatism, bilateral: Secondary | ICD-10-CM | POA: Diagnosis not present

## 2018-07-18 DIAGNOSIS — H509 Unspecified strabismus: Secondary | ICD-10-CM | POA: Diagnosis not present

## 2018-07-18 NOTE — Telephone Encounter (Signed)
Spoke with pt and she stated that she had a shingles vaccine about 5 years ago. I explained to pt that there is now a new shingles vaccine out that is called shingrix and it is a two dose vaccine. Explained to pt that it is advised that pt's get the new vaccine even though they had the old version. Pt was advised that she would need to get it at a pharmacy because Medicare will not pay for her to get it in the office. Pt gave a verbal understanding.

## 2018-07-18 NOTE — Telephone Encounter (Signed)
LMTCB. Please transfer pt to our office.  

## 2018-07-21 ENCOUNTER — Ambulatory Visit (INDEPENDENT_AMBULATORY_CARE_PROVIDER_SITE_OTHER): Payer: Medicare Other | Admitting: Cardiovascular Disease

## 2018-07-21 ENCOUNTER — Ambulatory Visit (INDEPENDENT_AMBULATORY_CARE_PROVIDER_SITE_OTHER): Payer: Medicare Other

## 2018-07-21 ENCOUNTER — Encounter: Payer: Self-pay | Admitting: Cardiovascular Disease

## 2018-07-21 VITALS — BP 110/64 | HR 63 | Temp 97.9°F | Resp 14 | Ht 66.75 in | Wt 141.1 lb

## 2018-07-21 VITALS — BP 84/50 | HR 66 | Ht 67.0 in | Wt 141.0 lb

## 2018-07-21 DIAGNOSIS — Z0000 Encounter for general adult medical examination without abnormal findings: Secondary | ICD-10-CM

## 2018-07-21 DIAGNOSIS — G894 Chronic pain syndrome: Secondary | ICD-10-CM | POA: Diagnosis not present

## 2018-07-21 DIAGNOSIS — M961 Postlaminectomy syndrome, not elsewhere classified: Secondary | ICD-10-CM | POA: Diagnosis not present

## 2018-07-21 DIAGNOSIS — Z01812 Encounter for preprocedural laboratory examination: Secondary | ICD-10-CM

## 2018-07-21 DIAGNOSIS — R079 Chest pain, unspecified: Secondary | ICD-10-CM | POA: Diagnosis not present

## 2018-07-21 DIAGNOSIS — I200 Unstable angina: Secondary | ICD-10-CM

## 2018-07-21 DIAGNOSIS — R072 Precordial pain: Secondary | ICD-10-CM | POA: Diagnosis not present

## 2018-07-21 DIAGNOSIS — R0789 Other chest pain: Secondary | ICD-10-CM | POA: Diagnosis not present

## 2018-07-21 DIAGNOSIS — G4700 Insomnia, unspecified: Secondary | ICD-10-CM | POA: Diagnosis not present

## 2018-07-21 DIAGNOSIS — Z79899 Other long term (current) drug therapy: Secondary | ICD-10-CM | POA: Diagnosis not present

## 2018-07-21 NOTE — Patient Instructions (Addendum)
Medication Instructions:  Your physician recommends that you continue on your current medications as directed. Please refer to the Current Medication list given to you today.  If you need a refill on your cardiac medications before your next appointment, please call your pharmacy.   Lab work: BMET 1 WEEK PRIOR TO CT If you have labs (blood work) drawn today and your tests are completely normal, you will receive your results only by: Marland Kitchen MyChart Message (if you have MyChart) OR . A paper copy in the mail If you have any lab test that is abnormal or we need to change your treatment, we will call you to review the results.  Testing/Procedures: Your physician has requested that you have cardiac CT. Cardiac computed tomography (CT) is a painless test that uses an x-ray machine to take clear, detailed pictures of your heart. For further information please visit [email or web address]tn. Please follow instruction sheet as given.  Follow-Up: At [facility], you and your health needs are our priority.  As part of our continuing mission to provide you with exceptional heart care, we have created designated Provider Care Teams.  These Care Teams include your primary Cardiologist (physician) and Advanced Practice Providers (APPs -  Physician Assistants and Nurse Practitioners) who all work together to provide you with the care you need, when you need it. You will need a follow up appointment in 12 months.  Please call our office 2 months in advance to schedule this appointment.  You may see DR [facility] or one of the following Advanced Practice Providers on your designated Care Team:   Kerin Ransom, PA-C Roby Lofts, [place] . Sande Rives, PA-C  Any Other Special Instructions Will Be Listed Below (If Applicable).  Please arrive at the [facility] main entrance of [facility] at xx:xx AM (30-45 minutes prior to test start time)  [facility] [place], [place]  [address] [phone]  Proceed to the [facility] Radiology Department (First Floor).  Please follow these instructions carefully (unless otherwise directed):  On the Night Before the Test: . Be sure to Drink plenty of water. . Do not consume any caffeinated/decaffeinated beverages or chocolate 12 hours prior to your test. . Do not take any antihistamines 12 hours prior to your test. . If you take Metformin do not take 24 hours prior to test. . If the patient has contrast allergy: ? Patient will need a prescription for Prednisone and very clear instructions (as follows): 1. Prednisone 50 mg - take 13 hours prior to test 2. Take another Prednisone 50 mg 7 hours prior to test 3. Take another Prednisone 50 mg 1 hour prior to test 4. Take Benadryl 50 mg 1 hour prior to test . Patient must complete all four doses of above prophylactic medications. . Patient will need a ride after test due to Benadryl.  On the Day of the Test: . Drink plenty of water. Do not drink any water within one hour of the test. . Do not eat any food 4 hours prior to the test. . You may take your regular medications prior to the test.  . Take metoprolol (Lopressor) two hours prior to test. . HOLD Furosemide/Hydrochlorothiazide morning of the test. .  Do not give Lopressor to patients with an allergy to lopressor or anyone with asthma or active COPD symptoms (currently taking steroids).       After the Test: . Drink plenty of water. . After receiving IV contrast, you may experience  a mild flushed feeling. This is normal. . On occasion, you may experience a mild rash up to 24 hours after the test. This is not dangerous. If this occurs, you can take Benadryl 25 mg and increase your fluid intake. . If you experience trouble breathing, this can be serious. If it is severe call 911 IMMEDIATELY. If it is mild, please call our office. . If you take any of these medications: Glipizide/Metformin, Avandament, Glucavance,  please do not take 48 hours after completing test.   Cardiac CT Angiogram A cardiac CT angiogram is a procedure to look at the heart and the area around the heart. It may be done to help find the cause of chest pains or other symptoms of heart disease. During this procedure, a large X-ray machine, called a CT scanner, takes detailed pictures of the heart and the surrounding area after a dye (contrast material) has been injected into blood vessels in the area. The procedure is also sometimes called a coronary CT angiogram, coronary artery scanning, or CTA. A cardiac CT angiogram allows the health care provider to see how well blood is flowing to and from the heart. The health care provider will be able to see if there are any problems, such as:  Blockage or narrowing of the coronary arteries in the heart.  Fluid around the heart.  Signs of weakness or disease in the muscles, valves, and tissues of the heart.  Tell a health care provider about:  Any allergies you have. This is especially important if you have had a previous allergic reaction to contrast dye.  All medicines you are taking, including vitamins, herbs, eye drops, creams, and over-the-counter medicines.  Any blood disorders you have.  Any surgeries you have had.  Any medical conditions you have.  Whether you are pregnant or may be pregnant.  Any anxiety disorders, chronic pain, or other conditions you have that may increase your stress or prevent you from lying still. What are the risks? Generally, this is a safe procedure. However, problems may occur, including:  Bleeding.  Infection.  Allergic reactions to medicines or dyes.  Damage to other structures or organs.  Kidney damage from the dye or contrast that is used.  Increased risk of cancer from radiation exposure. This risk is low. Talk with your health care provider about: ? The risks and benefits of testing. ? How you can receive the lowest dose of  radiation.  What happens before the procedure?  Wear comfortable clothing and remove any jewelry, glasses, dentures, and hearing aids.  Follow instructions from your health care provider about eating and drinking. This may include: ? For 12 hours before the test - avoid caffeine. This includes tea, coffee, soda, energy drinks, and diet pills. Drink plenty of water or other fluids that do not have caffeine in them. Being well-hydrated can prevent complications. ? For 4-6 hours before the test - stop eating and drinking. The contrast dye can cause nausea, but this is less likely if your stomach is empty.  Ask your health care provider about changing or stopping your regular medicines. This is especially important if you are taking diabetes medicines, blood thinners, or medicines to treat erectile dysfunction. What happens during the procedure?  Hair on your chest may need to be removed so that small sticky patches called electrodes can be placed on your chest. These will transmit information that helps to monitor your heart during the test.  An IV tube will be inserted into one  of your veins.  You might be given a medicine to control your heart rate during the test. This will help to ensure that good images are obtained.  You will be asked to lie on an exam table. This table will slide in and out of the CT machine during the procedure.  Contrast dye will be injected into the IV tube. You might feel warm, or you may get a metallic taste in your mouth.  You will be given a medicine (nitroglycerin) to relax (dilate) the arteries in your heart.  The table that you are lying on will move into the CT machine tunnel for the scan.  The person running the machine will give you instructions while the scans are being done. You may be asked to: ? Keep your arms above your head. ? Hold your breath. ? Stay very still, even if the table is moving.  When the scanning is complete, you will be moved out  of the machine.  The IV tube will be removed. The procedure may vary among health care providers and hospitals. What happens after the procedure?  You might feel warm, or you may get a metallic taste in your mouth from the contrast dye.  You may have a headache from the nitroglycerin.  After the procedure, drink water or other fluids to wash (flush) the contrast material out of your body.  Contact a health care provider if you have any symptoms of allergy to the contrast. These symptoms include: ? Shortness of breath. ? Rash or hives. ? A racing heartbeat.  Most people can return to their normal activities right after the procedure. Ask your health care provider what activities are safe for you.  It is up to you to get the results of your procedure. Ask your health care provider, or the department that is doing the procedure, when your results will be ready. Summary  A cardiac CT angiogram is a procedure to look at the heart and the area around the heart. It may be done to help find the cause of chest pains or other symptoms of heart disease.  During this procedure, a large X-ray machine, called a CT scanner, takes detailed pictures of the heart and the surrounding area after a dye (contrast material) has been injected into blood vessels in the area.  Ask your health care provider about changing or stopping your regular medicines before the procedure. This is especially important if you are taking diabetes medicines, blood thinners, or medicines to treat erectile dysfunction.  After the procedure, drink water or other fluids to wash (flush) the contrast material out of your body. This information is not intended to replace advice given to you by your health care provider. Make sure you discuss any questions you have with your health care provider. Document Released: 09/02/2008 Document Revised: 08/09/2016 Document Reviewed: 08/09/2016 Elsevier Interactive Patient Education  2017  [employer].

## 2018-07-21 NOTE — Progress Notes (Signed)
Subjective:   Sonya Cordova is a 72 y.o. female who presents for Medicare Annual (Subsequent) preventive examination.  Review of Systems:  No ROS.  Medicare Wellness Visit. Additional risk factors are reflected in the social history. Cardiac Risk Factors include: advanced age (>104men, >42 women)     Objective:     Vitals: BP 110/64 (BP Location: Left Arm, Patient Position: Sitting, Cuff Size: Normal)   Pulse 63   Temp 97.9 F (36.6 C) (Oral)   Resp 14   Ht 5' 6.75" (1.695 m)   Wt 141 lb 1.9 oz (64 kg)   BMI 22.27 kg/m   Body mass index is 22.27 kg/m.  Advanced Directives 07/21/2018 01/06/2018 07/20/2017 11/18/2016 11/09/2016 10/28/2016 10/26/2016  Does Patient Have a Medical Advance Directive? Yes Yes Yes No No No No  Type of Paramedic of [place];Living will [place];Living will [place];Living will - - - -  Does patient want to make changes to medical advance directive? No - Patient declined No - Patient declined No - Patient declined - - - -  Copy of [place] in Chart? Yes No - copy requested Yes - - - -  Pre-existing out of facility DNR order (yellow form or pink MOST form) - - - - - - -    Tobacco Social History   Tobacco Use  Smoking Status Former Smoker  . Packs/day: 1.00  . Years: 20.00  . Pack years: 20.00  . Types: Cigarettes  . Last attempt to quit: 10/05/1987  . Years since quitting: 30.8  Smokeless Tobacco Never Used     Counseling given: Not Answered   Clinical Intake:  Pre-visit preparation completed: Yes  Pain : 0-10 Pain Score: 8  Pain Type: Chronic pain Pain Location: Neck Pain Radiating Towards: Low back, hips Pain Descriptors / Indicators: Aching, Burning, Constant, Discomfort Pain Onset: More than a month ago Pain Frequency: Constant Pain Relieving Factors: Medication. Rest. Followed by Nicholaus Bloom for pain management.  Effect of Pain on Daily  Activities: She paces herself.   Pain Relieving Factors: Medication. Rest. Followed by Nicholaus Bloom for pain management.   Nutritional Status: BMI of 19-24  Normal Diabetes: No  How often do you need to have someone help you when you read instructions, pamphlets, or other written materials from your doctor or pharmacy?: 1 - Never  Interpreter Needed?: No     Past Medical History:  Diagnosis Date  . Anemia   . Arthritis   . Cancer (HCC)    Basal cell  . Complication of anesthesia    "states remembers surgeon talking- was light"  . Connective tissue disease ([place])   . Contact lens/glasses fitting    wears contacts or glasses  . Degenerative disc disease 1995  . Edema of both legs   . Family history of anesthesia complication   . Headache(784.0)    migraines  . History of blood transfusion   . Hypothyroidism   . Lymphedema   . Neuropathy   . Osteopenia 02/2012   t score -1.1  . PONV (postoperative nausea and vomiting)    Past Surgical History:  Procedure Laterality Date  . abdominal plasty    . APPENDECTOMY    . BACK SURGERY     neck,, cervical, fusion 1995-2010 ( 17 surgeries)  . BREAST ENHANCEMENT SURGERY  1978  . EYE SURGERY     as a child  . HAMMER TOE SURGERY    .  hemorrhoid    . KNEE ARTHROSCOPY    . knee replace    . left elbow    . left wrist    . SPINAL CORD STIMULATOR IMPLANT  2011  . THYROIDECTOMY  1986  . TONSILLECTOMY AND ADENOIDECTOMY    . TOTAL KNEE ARTHROPLASTY     2 times right  . TOTAL KNEE ARTHROPLASTY  05/22/2012   Procedure: TOTAL KNEE ARTHROPLASTY;  Surgeon: Gearlean Alf, MD;  Location: [facility];  Service: Orthopedics;  Laterality: Left;  . TOTAL SHOULDER REPLACEMENT     2 times right shoulder  . TUBAL LIGATION    . ULNAR NERVE TRANSPOSITION Left 06/13/2013   Procedure: DECOMPRESSION, POSSIBLE TRANSPOSITION LEFT ULNAR NERVE;  Surgeon: Wynonia Sours, MD;  Location: [facility];  Service: Orthopedics;  Laterality: Left;  Marland Kitchen  VAGINAL HYSTERECTOMY  1986   Family History  Problem Relation Age of Onset  . Hypertension Sister   . Stroke Sister   . Heart disease Sister   . Thyroid disease Sister   . Hypertension Mother   . Heart disease Mother   . Pancreatic cancer Mother   . Skin cancer Mother   . Rheum arthritis Mother   . Hyperlipidemia Mother   . Hypertension Father   . Heart disease Father   . Skin cancer Father   . Osteoarthritis Father   . Hypertension Brother   . Osteoarthritis Brother   . Skin cancer Sister   . Osteoarthritis Sister   . Hypertension Sister   . Heart attack Son    Social History   Socioeconomic History  . Marital status: Married    Spouse name: Not on file  . Number of children: Not on file  . Years of education: Not on file  . Highest education level: Not on file  Occupational History  . Occupation: Therapist, sports- Disabled  Social Needs  . Financial resource strain: Not hard at all  . Food insecurity:    Worry: Never true    Inability: Never true  . Transportation needs:    Medical: No    Non-medical: No  Tobacco Use  . Smoking status: Former Smoker    Packs/day: 1.00    Years: 20.00    Pack years: 20.00    Types: Cigarettes    Last attempt to quit: 10/05/1987    Years since quitting: 30.8  . Smokeless tobacco: Never Used  Substance and Sexual Activity  . Alcohol use: No    Alcohol/week: 0.0 standard drinks  . Drug use: No  . Sexual activity: Yes    Partners: Male    Birth control/protection: Post-menopausal  Lifestyle  . Physical activity:    Days per week: 0 days    Minutes per session: Not on file  . Stress: Not on file  Relationships  . Social connections:    Talks on phone: Not on file    Gets together: Not on file    Attends religious service: Not on file    Active member of club or organization: Not on file    Attends meetings of clubs or organizations: Not on file    Relationship status: Not on file  Other Topics Concern  . Not on file  Social  History Narrative  . Not on file    Outpatient Encounter Medications as of 07/21/2018  Medication Sig  . azelastine (ASTELIN) 0.1 % nasal spray Place 1 spray into both nostrils 2 (two) times daily.  . celecoxib (CELEBREX) 200 MG  capsule Take 1 capsule (200 mg total) by mouth daily.  . Cholecalciferol (VITAMIN D3) 1000 units CAPS Take 1,000 Units by mouth at bedtime.   . diazepam (VALIUM) 5 MG tablet TAKE 1 TABLET BY MOUTH EVERY DAY AT BEDTIME AS NEEDED FOR ANXIETY  . diclofenac sodium (VOLTAREN) 1 % GEL Apply 1 application topically daily as needed (pain).  . Eszopiclone (ESZOPICLONE) 3 MG TABS Take 3 mg by mouth at bedtime. Take immediately before bedtime. Name Brand ONLY  . gabapentin (NEURONTIN) 300 MG capsule Take 600 mg by mouth 3 (three) times daily.  Marland Kitchen ibuprofen (ADVIL,MOTRIN) 200 MG tablet Take 600 mg by mouth 2 (two) times daily.  Marland Kitchen ipratropium (ATROVENT) 0.06 % nasal spray USE TWO SPRAY(S) IN EACH NOSTRIL 4 TIMES DAILY (Patient taking differently: Place 2 sprays into both nostrils 4 (four) times daily as needed for rhinitis. )  . Lactobacillus (PROBIOTIC ACIDOPHILUS PO) Take 1 capsule by mouth daily.  Marland Kitchen loratadine (CLARITIN) 10 MG tablet Take 10 mg by mouth daily.  . Melatonin 10 MG TABS Take 20 mg by mouth at bedtime.  . methocarbamol (ROBAXIN) 750 MG tablet Take 750-2,250 mg by mouth See admin instructions. Take 3 tablets (2250 mg) by mouth daily at bedtime, may also take 1 or 2 tablets ([phone]  mg) during the day every 6 hours as needed for muscle spasms  . montelukast (SINGULAIR) 10 MG tablet TAKE 1 TABLET BY MOUTH AT BEDTIME  . morphine (MSIR) 30 MG tablet Take 30 mg by mouth every 8 (eight) hours. pain  . naloxegol oxalate (MOVANTIK) 25 MG TABS tablet Take 25 mg by mouth daily.   . nitroGLYCERIN (NITROSTAT) 0.4 MG SL tablet Place 1 tablet (0.4 mg total) under the tongue every 5 (five) minutes as needed for chest pain.  . pantoprazole (PROTONIX) 40 MG tablet TAKE ONE TABLET BY  MOUTH ONE TIME DAILY   . polyethylene glycol (MIRALAX / GLYCOLAX) packet Take 17 g by mouth daily as needed (constipation).   . Red Yeast Rice 600 MG CAPS Take 600 mg by mouth 2 (two) times daily.  Marland Kitchen SYNTHROID 125 MCG tablet TAKE 1 TABLET (125 MCG TOTAL) BY MOUTH DAILY BEFORE BREAKFAST.  Marland Kitchen tiZANidine (ZANAFLEX) 4 MG tablet Take 1 tablet (4 mg total) by mouth every 6 (six) hours as needed for muscle spasms.  Marland Kitchen torsemide (DEMADEX) 5 MG tablet TAKE 1 TABLET BY MOUTH ONCE DAILY AS NEEDED   No facility-administered encounter medications on file as of 07/21/2018.     Activities of Daily Living In your present state of health, do you have any difficulty performing the following activities: 07/21/2018 01/06/2018  Hearing? N N  Vision? N N  Difficulty concentrating or making decisions? N N  Walking or climbing stairs? Y Y  Dressing or bathing? N N  Doing errands, shopping? N N  Preparing Food and eating ? N -  Using the Toilet? N -  In the past six months, have you accidently leaked urine? Y -  Comment Urgency. Managed with daily liner.  -  Do you have problems with loss of bowel control? N -  Managing your Medications? N -  Managing your Finances? N -  Housekeeping or managing your Housekeeping? Y -  Some recent data might be hidden    Patient Care Team: Crecencio Mc, MD as PCP - General (Internal Medicine) Stanford Breed Denice Bors, MD as PCP - Cardiology (Cardiology)    Assessment:   This is a routine wellness examination for  Sonya Cordova. The goal of the wellness visit is to assist the patient how to close the gaps in care and create a preventative care plan for the patient.   The roster of all physicians providing medical care to patient is listed in the Snapshot section of the chart.  Osteopenia. Osteoarthritis. Taking calcium VIT D as appropriate/Osteoporosis risk reviewed.    Safety issues reviewed; Smoke and carbon monoxide detectors in the home. No firearms in the home. Wears seatbelts  when driving or riding with others. No violence in the home.  They do not have excessive sun exposure.  Discussed the need for sun protection: hats, long sleeves and the use of sunscreen if there is significant sun exposure.  No new identified risk were noted.  No failures at ADL's or IADL's.    BMI- discussed the importance of a healthy diet, water intake and the benefits of aerobic exercise. Educational material provided.   24 hour diet recall: Regular diet  Dental- every 6 months.  Eye- Visual acuity not assessed per patient preference since they have regular follow up with the ophthalmologist.  Wears corrective lenses.  Sleep patterns- Sleeps about 5 hours at night.   Health maintenance gaps- closed.  Patient Concerns: None at this time. Follow up with PCP as needed.  Exercise Activities and Dietary recommendations Current Exercise Habits: The patient does not participate in regular exercise at present  Goals      Patient Stated   . Walk more (pt-stated)       Fall Risk Fall Risk  07/21/2018 07/20/2017 06/14/2017 05/28/2016 01/16/2015  Falls in the past year? Yes No No No Yes  Comment - - - Emmi Telephone Survey: data to providers prior to load -  Number falls in past yr: 2 or more - - - (No Data)  Comment - - - - tripped over dog  Injury with Fall? No - - - Yes  Comment - - - - bruised shoulder  Risk Factor Category  High Fall Risk - - - High Fall Risk  Comment - - - - neuropathy  Risk for fall due to : History of fall(s) - - - Other (Comment)   Depression Screen PHQ 2/9 Scores 07/21/2018 07/20/2017 01/16/2015  PHQ - 2 Score 0 0 1  PHQ- 9 Score - 0 -     Cognitive Function MMSE - Mini Mental State Exam 07/20/2017  Orientation to time 5  Orientation to Place 5  Registration 3  Attention/ Calculation 5  Recall 3  Language- name 2 objects 2  Language- repeat 1  Language- follow 3 step command 3  Language- read & follow direction 1  Write a sentence 1    Copy design 1  Total score 30     6CIT Screen 07/21/2018  What Year? 0 points  What month? 0 points  What time? 0 points  Count back from 20 0 points  Months in reverse 0 points  Repeat phrase 0 points  Total Score 0    Immunization History  Administered Date(s) Administered  . Influenza Split 06/04/2014, 06/05/2015, 06/29/2016  . Influenza, High Dose Seasonal PF 06/08/2017  . Influenza,inj,Quad PF,6+ Mos 06/21/2013  . Influenza-Unspecified 06/04/2012, 07/19/2018  . Pneumococcal Conjugate-13 08/10/2013  . Pneumococcal Polysaccharide-23 04/01/2015  . Tdap 09/07/2016  . Zoster 08/21/2013  . Zoster Recombinat (Shingrix) 07/19/2018   Screening Tests Health Maintenance  Topic Date Due  . MAMMOGRAM  03/08/2020  . COLONOSCOPY  11/01/2020  . TETANUS/TDAP  09/07/2026  .  INFLUENZA VACCINE  Completed  . DEXA SCAN  Completed  . Hepatitis C Screening  Completed  . PNA vac Low Risk Adult  Completed      Plan:    End of life planning; Advance aging; Advanced directives discussed. Copy of current HCPOA/Living Will on file.    I have personally reviewed and noted the following in the patient's chart:   . Medical and social history . Use of alcohol, tobacco or illicit drugs  . Current medications and supplements . Functional ability and status . Nutritional status . Physical activity . Advanced directives . List of other physicians . Hospitalizations, surgeries, and ER visits in previous 12 months . Vitals . Screenings to include cognitive, depression, and falls . Referrals and appointments  In addition, I have reviewed and discussed with patient certain preventive protocols, quality metrics, and best practice recommendations. A written personalized care plan for preventive services as well as general preventive health recommendations were provided to patient.     OBrien-Blaney, Denisa L, LPN  [id number]    I have reviewed the above information and agree with above.    Deborra Medina, MD

## 2018-07-21 NOTE — Patient Instructions (Addendum)
  Ms. Voth , Thank you for taking time to come for your Medicare Wellness Visit. I appreciate your ongoing commitment to your health goals. Please review the following plan we discussed and let me know if I can assist you in the future.   These are the goals we discussed: Goals      Patient Stated   . Walk more (pt-stated)       This is a list of the screening recommended for you and due dates:  Health Maintenance  Topic Date Due  . Mammogram  03/08/2020  . Colon Cancer Screening  11/01/2020  . Tetanus Vaccine  09/07/2026  . Flu Shot  Completed  . DEXA scan (bone density measurement)  Completed  .  Hepatitis C: One time screening is recommended by Center for Disease Control  (CDC) for  adults born from [birth year] through 1963.   Completed  . Pneumonia vaccines  Completed

## 2018-07-21 NOTE — Progress Notes (Signed)
Cardiology Office Note   Date:  07/21/2018   ID:  LOY LITTLE, DOB 06-Sep-1946, MRN [id number]  PCP:  Crecencio Mc, MD  Cardiologist:   Skeet Latch, MD   Chief Complaint  Patient presents with  . Follow-up    6 months  . Chest Pain    Pressure.  . Fatigue      History of Present Illness: ARY RUDNICK is a 72 y.o. female with hypothyroidism who presents for followup.  She was initially seen pre-operative risk assessment in 2016. She has previously undergone 17 back or neck surgeries.  She was evaluated in a spine and scoliosis clinic where they recommended an additional surgery.  She has severe and worsening kyphosis and noted that she feels like there is a corset-like band that restricts her breathing at times.  This is worst at night when she is laying on one side.  At the time she had no exertional shortness of breath or chest pain and was deemed low risk for surgery.  Prior to one of her past surgeries she was noted to have an abnormal EKG and follows up with a cardiologist.  Upon follow up her EKG was normal.    Since her last appointment she was seen in the hospital 01/2018 for chest tightness that was relieved with nitroglycerin.  She saw her PCP where CK was elevated ot 394 with a CK-MB of 22.4.  Troponin was normal.  Her symptoms were thought to be atypical.  Echo that admission revealed LVEF 55-60% with grade 1 diastolic dysfunction and mild posterior leaflet MVP.  She also had a Lexiscan Myoview that was negative for ischemia.  She is very upset that her hospitalization was deemed an observation instead of admission and she had to pay extremely elevated prices her her home medications.  For the last year Ms. Kerby Nora has experienced on and off expisodes of chest pressure.  However it has been worse for the last 3-4 weeks.  It occurs randomly and typically lasts for a few minutes at a time.  She had one episode that lasted longer.  There is no associated  shortness of breath, nausea or diaphoresis.  The pain is not intense but more of an aggravation.  She is unable to exert herself due to chronic back pain, so she is unsure if there is an exertional component.  It is the same pain that she felt in the hospital.  She has chronic LE edema that improves with compression stockings and torsemide.  She doesn't cook with or add salt to her diet.  Past Medical History:  Diagnosis Date  . Anemia   . Arthritis   . Cancer (HCC)    Basal cell  . Complication of anesthesia    "states remembers surgeon talking- was light"  . Connective tissue disease ([place])   . Contact lens/glasses fitting    wears contacts or glasses  . Degenerative disc disease 1995  . Edema of both legs   . Family history of anesthesia complication   . Headache(784.0)    migraines  . History of blood transfusion   . Hypothyroidism   . Lymphedema   . Neuropathy   . Osteopenia 02/2012   t score -1.1  . PONV (postoperative nausea and vomiting)     Past Surgical History:  Procedure Laterality Date  . abdominal plasty    . APPENDECTOMY    . BACK SURGERY     neck,, cervical, fusion 1995-2010 ( 17  surgeries)  . BREAST ENHANCEMENT SURGERY  1978  . EYE SURGERY     as a child  . HAMMER TOE SURGERY    . hemorrhoid    . KNEE ARTHROSCOPY    . knee replace    . left elbow    . left wrist    . SPINAL CORD STIMULATOR IMPLANT  2011  . THYROIDECTOMY  1986  . TONSILLECTOMY AND ADENOIDECTOMY    . TOTAL KNEE ARTHROPLASTY     2 times right  . TOTAL KNEE ARTHROPLASTY  05/22/2012   Procedure: TOTAL KNEE ARTHROPLASTY;  Surgeon: Gearlean Alf, MD;  Location: [facility];  Service: Orthopedics;  Laterality: Left;  . TOTAL SHOULDER REPLACEMENT     2 times right shoulder  . TUBAL LIGATION    . ULNAR NERVE TRANSPOSITION Left 06/13/2013   Procedure: DECOMPRESSION, POSSIBLE TRANSPOSITION LEFT ULNAR NERVE;  Surgeon: Wynonia Sours, MD;  Location: [facility];  Service: Orthopedics;   Laterality: Left;  Marland Kitchen VAGINAL HYSTERECTOMY  1986     Current Outpatient Medications  Medication Sig Dispense Refill  . azelastine (ASTELIN) 0.1 % nasal spray Place 1 spray into both nostrils 2 (two) times daily.    . celecoxib (CELEBREX) 200 MG capsule Take 1 capsule (200 mg total) by mouth daily. 30 capsule 5  . Cholecalciferol (VITAMIN D3) 1000 units CAPS Take 1,000 Units by mouth at bedtime.     . diazepam (VALIUM) 5 MG tablet TAKE 1 TABLET BY MOUTH EVERY DAY AT BEDTIME AS NEEDED FOR ANXIETY 30 tablet 1  . diclofenac sodium (VOLTAREN) 1 % GEL Apply 1 application topically daily as needed (pain).    . Eszopiclone (ESZOPICLONE) 3 MG TABS Take 3 mg by mouth at bedtime. Take immediately before bedtime. Name Brand ONLY    . gabapentin (NEURONTIN) 300 MG capsule Take 600 mg by mouth 3 (three) times daily.    Marland Kitchen ibuprofen (ADVIL,MOTRIN) 200 MG tablet Take 600 mg by mouth 2 (two) times daily.    Marland Kitchen ipratropium (ATROVENT) 0.06 % nasal spray USE TWO SPRAY(S) IN EACH NOSTRIL 4 TIMES DAILY (Patient taking differently: Place 2 sprays into both nostrils 4 (four) times daily as needed for rhinitis. ) 15 mL 5  . Lactobacillus (PROBIOTIC ACIDOPHILUS PO) Take 1 capsule by mouth daily.    Marland Kitchen loratadine (CLARITIN) 10 MG tablet Take 10 mg by mouth daily.    . Melatonin 10 MG TABS Take 20 mg by mouth at bedtime.    . methocarbamol (ROBAXIN) 750 MG tablet Take 750-2,250 mg by mouth See admin instructions. Take 3 tablets (2250 mg) by mouth daily at bedtime, may also take 1 or 2 tablets ([phone]  mg) during the day every 6 hours as needed for muscle spasms    . montelukast (SINGULAIR) 10 MG tablet TAKE 1 TABLET BY MOUTH AT BEDTIME 90 tablet 2  . morphine (MSIR) 30 MG tablet Take 30 mg by mouth every 8 (eight) hours. pain    . naloxegol oxalate (MOVANTIK) 25 MG TABS tablet Take 25 mg by mouth daily.     . nitroGLYCERIN (NITROSTAT) 0.4 MG SL tablet Place 1 tablet (0.4 mg total) under the tongue every 5 (five) minutes as  needed for chest pain. 50 tablet 3  . pantoprazole (PROTONIX) 40 MG tablet TAKE ONE TABLET BY MOUTH ONE TIME DAILY  60 tablet 1  . polyethylene glycol (MIRALAX / GLYCOLAX) packet Take 17 g by mouth daily as needed (constipation).     Marland Kitchen  Red Yeast Rice 600 MG CAPS Take 600 mg by mouth 2 (two) times daily.    Marland Kitchen SYNTHROID 125 MCG tablet TAKE 1 TABLET (125 MCG TOTAL) BY MOUTH DAILY BEFORE BREAKFAST. 90 tablet 0  . tiZANidine (ZANAFLEX) 4 MG tablet Take 1 tablet (4 mg total) by mouth every 6 (six) hours as needed for muscle spasms. 90 tablet 1  . torsemide (DEMADEX) 5 MG tablet TAKE 1 TABLET BY MOUTH ONCE DAILY AS NEEDED 90 tablet 1   No current facility-administered medications for this visit.     Allergies:   Cleocin [clindamycin hcl]; Amoxicillin-pot clavulanate; and Codeine    Social History:  The patient  reports that she quit smoking about 30 years ago. Her smoking use included cigarettes. She has a 20.00 pack-year smoking history. She has never used smokeless tobacco. She reports that she does not drink alcohol or use drugs.   Family History:  The patient's family history includes Heart attack in her son; Heart disease in her father, mother, and sister; Hyperlipidemia in her mother; Hypertension in her brother, father, mother, sister, and sister; Osteoarthritis in her brother, father, and sister; Pancreatic cancer in her mother; Rheum arthritis in her mother; Skin cancer in her father, mother, and sister; Stroke in her sister; Thyroid disease in her sister.    ROS:  Please see the history of present illness.   Otherwise, review of systems are positive for none.   All other systems are reviewed and negative.    PHYSICAL EXAM: VS:  BP (!) 84/50 (BP Location: Left Arm, Patient Position: Sitting, Cuff Size: Normal)   Pulse 66   Ht 5\' 7"  (1.702 m)   Wt 141 lb (64 kg)   BMI 22.08 kg/m  , BMI Body mass index is 22.08 kg/m. GENERAL:  Well appearing HEENT: Pupils equal round and reactive,  fundi not visualized, oral mucosa unremarkable NECK:  No jugular venous distention, waveform within normal limits, carotid upstroke brisk and symmetric, no bruits LUNGS:  Clear to auscultation bilaterally HEART:  RRR.  PMI not displaced or sustained,S1 and S2 within normal limits, no S3, no S4, no clicks, no rubs, no murmurs ABD:  Flat, positive bowel sounds normal in frequency in pitch, no bruits, no rebound, no guarding, no midline pulsatile mass, no hepatomegaly, no splenomegaly EXT:  2 plus pulses throughout, no edema, no cyanosis no clubbing SKIN:  No rashes no nodules NEURO:  Cranial nerves II through XII grossly intact, motor grossly intact throughout PSYCH:  Cognitively intact, oriented to person place and time   EKG:  EKG is ordered today. The ekg ordered 07/25/15 demonstrates sinus bradycardia at 56 bpm. 07/21/18: Sinus rhythm.  Rate 66 bpm.    Echo 01/2018: Study Conclusions  - Left ventricle: The cavity size was normal. Wall thickness was   normal. Systolic function was normal. The estimated ejection   fraction was in the range of 55% to 60%. Wall motion was normal;   there were no regional wall motion abnormalities. Doppler   parameters are consistent with abnormal left ventricular   relaxation (grade 1 diastolic dysfunction). - Mitral valve: Mild prolapse, involving the posterior leaflet.  Impressions:  - Normal LV systolic function; mild diastolic dysfunction; mild   prolapse of posterior MV leaflet.  Lexiscan Myoview 01/09/18:  T wave inversion was noted during stress.  The study is normal.  This is a low risk study.  The left ventricular ejection fraction is normal (55-65%).  Recent Labs: 01/06/2018: ALT 17; B Natriuretic  Peptide 43.1; BUN 30; Creatinine, Ser 0.93; Hemoglobin 12.7; Magnesium 1.8; Platelets 161; Potassium 3.8; Sodium 142; TSH 1.88    Lipid Panel    Component Value Date/Time   CHOL 189 08/31/2016 1007   TRIG 65.0 08/31/2016 1007   HDL  73.70 08/31/2016 1007   CHOLHDL 3 08/31/2016 1007   VLDL 13.0 08/31/2016 1007   LDLCALC 102 (H) 08/31/2016 1007   LDLDIRECT 122.0 06/14/2017 1541      Wt Readings from Last 3 Encounters:  07/21/18 141 lb (64 kg)  07/21/18 141 lb 1.9 oz (64 kg)  03/22/18 138 lb 12.8 oz (63 kg)      ASSESSMENT AND PLAN:  # Atypical chest pain:  Ms. Coaxum continue to have atypical chest pain.  Given her chronic pain was cannot assess whether her symptoms are exertional.  She had a negative Lexiscan Myoview 01/2018 so we will not repeat.  We will get a coronary CT-A to better assess her coronaries.   Current medicines are reviewed at length with the patient today.  The patient does not have concerns regarding medicines.  The following changes have been made:  no change  Labs/ tests ordered today include:  No orders of the defined types were placed in this encounter.    Disposition:   FU with Johnsie Moscoso C. Oval Linsey, MD as needed.   Signed, Skeet Latch, MD  07/21/2018 4:04 PM    [facility]

## 2018-07-24 ENCOUNTER — Encounter: Payer: Self-pay | Admitting: Cardiovascular Disease

## 2018-07-27 DIAGNOSIS — H2513 Age-related nuclear cataract, bilateral: Secondary | ICD-10-CM | POA: Diagnosis not present

## 2018-07-27 DIAGNOSIS — H25812 Combined forms of age-related cataract, left eye: Secondary | ICD-10-CM | POA: Diagnosis not present

## 2018-07-27 DIAGNOSIS — H2512 Age-related nuclear cataract, left eye: Secondary | ICD-10-CM | POA: Diagnosis not present

## 2018-07-29 ENCOUNTER — Other Ambulatory Visit: Payer: Self-pay | Admitting: Internal Medicine

## 2018-07-31 NOTE — Telephone Encounter (Signed)
Refilled: 06/06/2018 Last OV: 03/22/2018 Next OV: 01/18/2019

## 2018-08-14 ENCOUNTER — Encounter: Payer: Self-pay | Admitting: Cardiology

## 2018-08-14 DIAGNOSIS — Z01812 Encounter for preprocedural laboratory examination: Secondary | ICD-10-CM | POA: Diagnosis not present

## 2018-08-15 ENCOUNTER — Telehealth: Payer: Self-pay | Admitting: *Deleted

## 2018-08-15 LAB — BASIC METABOLIC PANEL
BUN/Creatinine Ratio: 30 — ABNORMAL HIGH (ref 12–28)
BUN: 24 mg/dL (ref 8–27)
CO2: 28 mmol/L (ref 20–29)
Calcium: 9.9 mg/dL (ref 8.7–10.3)
Chloride: 99 mmol/L (ref 96–106)
Creatinine, Ser: 0.81 mg/dL (ref 0.57–1.00)
GFR calc Af Amer: 85 mL/min/{1.73_m2} (ref >59)
GFR calc non Af Amer: 74 mL/min/{1.73_m2} (ref >59)
Glucose: 96 mg/dL (ref 65–99)
Potassium: 4.3 mmol/L (ref 3.5–5.2)
Sodium: 143 mmol/L (ref 134–144)

## 2018-08-15 NOTE — Telephone Encounter (Signed)
   Primary Cardiologist: Kirk Ruths, MD  Chart reviewed as part of pre-operative protocol coverage. Cataract extractions are recognized in guidelines as low risk surgeries that do not typically require specific preoperative testing or holding of blood thinner therapy. Therefore, given past medical history and time since last visit, based on ACC/AHA guidelines, Sonya Cordova would be at acceptable risk for the planned procedure without further cardiovascular testing.   I will route this recommendation to the requesting party via Epic fax function and remove from pre-op pool.  Please call with questions.  [place], [place] 08/15/2018, 3:17 PM

## 2018-08-15 NOTE — Telephone Encounter (Signed)
   [facility] Pre-operative Risk Assessment    Request for surgical clearance:  1. What type of surgery is being performed? CATARACT SURGERY   2. When is this surgery scheduled? 08/24/18   3. What type of clearance is required (medical clearance vs. Pharmacy clearance to hold med vs. Both)? MEDICAL   4. Are there any medications that need to be held prior to surgery and how long?   5. Practice name and name of physician performing surgery? [facility]   6. What is your office phone number? [phone]   7.   What is your office fax number? [place]   Anesthesia type (None, local, MAC, general) ? UNKNOWN

## 2018-08-18 ENCOUNTER — Ambulatory Visit (HOSPITAL_COMMUNITY)
Admission: RE | Admit: 2018-08-18 | Discharge: 2018-08-18 | Disposition: A | Discharge status: Home / Self Care | Payer: Medicare Other | Source: Ambulatory Visit | Attending: Cardiovascular Disease | Admitting: Cardiovascular Disease

## 2018-08-18 ENCOUNTER — Ambulatory Visit (HOSPITAL_COMMUNITY): Payer: Medicare Other

## 2018-08-18 DIAGNOSIS — I700 Atherosclerosis of aorta: Secondary | ICD-10-CM | POA: Insufficient documentation

## 2018-08-18 DIAGNOSIS — R072 Precordial pain: Secondary | ICD-10-CM | POA: Insufficient documentation

## 2018-08-18 MED ORDER — METOPROLOL TARTRATE 5 MG/5ML IV SOLN
INTRAVENOUS | Status: AC
  Filled 2018-08-18: qty 10

## 2018-08-18 MED ORDER — METOPROLOL TARTRATE 5 MG/5ML IV SOLN
10.0000 mg | INTRAVENOUS | Status: DC | PRN
  Filled 2018-08-18: qty 10

## 2018-08-18 MED ORDER — NITROGLYCERIN 0.4 MG SL SUBL
SUBLINGUAL_TABLET | SUBLINGUAL | Status: AC
  Filled 2018-08-18: qty 2

## 2018-08-18 MED ORDER — NITROGLYCERIN 0.4 MG SL SUBL
0.8000 mg | SUBLINGUAL_TABLET | Freq: Once | SUBLINGUAL | Status: AC
  Administered 2018-08-18: 0.8 mg via SUBLINGUAL
  Filled 2018-08-18: qty 25

## 2018-08-18 MED ORDER — IOPAMIDOL (ISOVUE-370) INJECTION 76%
100.0000 mL | Freq: Once | INTRAVENOUS | Status: AC | PRN
  Administered 2018-08-18: 100 mL via INTRAVENOUS

## 2018-08-23 NOTE — Progress Notes (Deleted)
HPI:  Current Outpatient Medications  Medication Sig Dispense Refill  . azelastine (ASTELIN) 0.1 % nasal spray Place 1 spray into both nostrils 2 (two) times daily.    . celecoxib (CELEBREX) 200 MG capsule Take 1 capsule (200 mg total) by mouth daily. 30 capsule 5  . Cholecalciferol (VITAMIN D3) 1000 units CAPS Take 1,000 Units by mouth at bedtime.     . diazepam (VALIUM) 5 MG tablet TAKE 1 TABLET BY MOUTH EVERY DAY AT BEDTIME AS NEEDED FOR ANXIETY 30 tablet 1  . diclofenac sodium (VOLTAREN) 1 % GEL Apply 1 application topically daily as needed (pain).    . Eszopiclone (ESZOPICLONE) 3 MG TABS Take 3 mg by mouth at bedtime. Take immediately before bedtime. Name Brand ONLY    . gabapentin (NEURONTIN) 300 MG capsule Take 600 mg by mouth 3 (three) times daily.    Marland Kitchen ibuprofen (ADVIL,MOTRIN) 200 MG tablet Take 600 mg by mouth 2 (two) times daily.    Marland Kitchen ipratropium (ATROVENT) 0.06 % nasal spray USE TWO SPRAY(S) IN EACH NOSTRIL 4 TIMES DAILY (Patient taking differently: Place 2 sprays into both nostrils 4 (four) times daily as needed for rhinitis. ) 15 mL 5  . Lactobacillus (PROBIOTIC ACIDOPHILUS PO) Take 1 capsule by mouth daily.    Marland Kitchen loratadine (CLARITIN) 10 MG tablet Take 10 mg by mouth daily.    . Melatonin 10 MG TABS Take 20 mg by mouth at bedtime.    . methocarbamol (ROBAXIN) 750 MG tablet Take 750-2,250 mg by mouth See admin instructions. Take 3 tablets (2250 mg) by mouth daily at bedtime, may also take 1 or 2 tablets ([phone]  mg) during the day every 6 hours as needed for muscle spasms    . montelukast (SINGULAIR) 10 MG tablet TAKE 1 TABLET BY MOUTH AT BEDTIME 90 tablet 2  . morphine (MSIR) 30 MG tablet Take 30 mg by mouth every 8 (eight) hours. pain    . naloxegol oxalate (MOVANTIK) 25 MG TABS tablet Take 25 mg by mouth daily.     . nitroGLYCERIN (NITROSTAT) 0.4 MG SL tablet Place 1 tablet (0.4 mg total) under the tongue every 5 (five) minutes as needed for chest pain. 50 tablet 3  .  pantoprazole (PROTONIX) 40 MG tablet TAKE ONE TABLET BY MOUTH ONE TIME DAILY  60 tablet 1  . polyethylene glycol (MIRALAX / GLYCOLAX) packet Take 17 g by mouth daily as needed (constipation).     . Red Yeast Rice 600 MG CAPS Take 600 mg by mouth 2 (two) times daily.    Marland Kitchen SYNTHROID 125 MCG tablet TAKE 1 TABLET (125 MCG TOTAL) BY MOUTH DAILY BEFORE BREAKFAST. 90 tablet 0  . tiZANidine (ZANAFLEX) 4 MG tablet Take 1 tablet (4 mg total) by mouth every 6 (six) hours as needed for muscle spasms. 90 tablet 1  . torsemide (DEMADEX) 5 MG tablet TAKE 1 TABLET BY MOUTH ONCE DAILY AS NEEDED 90 tablet 1   No current facility-administered medications for this visit.      Past Medical History:  Diagnosis Date  . Anemia   . Arthritis   . Cancer (HCC)    Basal cell  . Complication of anesthesia    "states remembers surgeon talking- was light"  . Connective tissue disease ([place])   . Contact lens/glasses fitting    wears contacts or glasses  . Degenerative disc disease 1995  . Edema of both legs   . Family history of anesthesia complication   .  Headache(784.0)    migraines  . History of blood transfusion   . Hypothyroidism   . Lymphedema   . Neuropathy   . Osteopenia 02/2012   t score -1.1  . PONV (postoperative nausea and vomiting)     Past Surgical History:  Procedure Laterality Date  . abdominal plasty    . APPENDECTOMY    . BACK SURGERY     neck,, cervical, fusion 1995-2010 ( 17 surgeries)  . BREAST ENHANCEMENT SURGERY  1978  . EYE SURGERY     as a child  . HAMMER TOE SURGERY    . hemorrhoid    . KNEE ARTHROSCOPY    . knee replace    . left elbow    . left wrist    . SPINAL CORD STIMULATOR IMPLANT  2011  . THYROIDECTOMY  1986  . TONSILLECTOMY AND ADENOIDECTOMY    . TOTAL KNEE ARTHROPLASTY     2 times right  . TOTAL KNEE ARTHROPLASTY  05/22/2012   Procedure: TOTAL KNEE ARTHROPLASTY;  Surgeon: Gearlean Alf, MD;  Location: [facility];  Service: Orthopedics;  Laterality: Left;  .  TOTAL SHOULDER REPLACEMENT     2 times right shoulder  . TUBAL LIGATION    . ULNAR NERVE TRANSPOSITION Left 06/13/2013   Procedure: DECOMPRESSION, POSSIBLE TRANSPOSITION LEFT ULNAR NERVE;  Surgeon: Wynonia Sours, MD;  Location: [place] [facility];  Service: Orthopedics;  Laterality: Left;  Marland Kitchen VAGINAL HYSTERECTOMY  1986    Social History   Socioeconomic History  . Marital status: Married    Spouse name: Not on file  . Number of children: Not on file  . Years of education: Not on file  . Highest education level: Not on file  Occupational History  . Occupation: Therapist, sports- Disabled  Social Needs  . Financial resource strain: Not hard at all  . Food insecurity:    Worry: Never true    Inability: Never true  . Transportation needs:    Medical: No    Non-medical: No  Tobacco Use  . Smoking status: Former Smoker    Packs/day: 1.00    Years: 20.00    Pack years: 20.00    Types: Cigarettes    Last attempt to quit: 10/05/1987    Years since quitting: 30.9  . Smokeless tobacco: Never Used  Substance and Sexual Activity  . Alcohol use: No    Alcohol/week: 0.0 standard drinks  . Drug use: No  . Sexual activity: Yes    Partners: Male    Birth control/protection: Post-menopausal  Lifestyle  . Physical activity:    Days per week: 0 days    Minutes per session: Not on file  . Stress: Not on file  Relationships  . Social connections:    Talks on phone: Not on file    Gets together: Not on file    Attends religious service: Not on file    Active member of club or organization: Not on file    Attends meetings of clubs or organizations: Not on file    Relationship status: Not on file  . Intimate partner violence:    Fear of current or ex partner: No    Emotionally abused: No    Physically abused: No    Forced sexual activity: No  Other Topics Concern  . Not on file  Social History Narrative  . Not on file    Family History  Problem Relation Age of Onset  . Hypertension Sister    .  Stroke Sister   . Heart disease Sister   . Thyroid disease Sister   . Hypertension Mother   . Heart disease Mother   . Pancreatic cancer Mother   . Skin cancer Mother   . Rheum arthritis Mother   . Hyperlipidemia Mother   . Hypertension Father   . Heart disease Father   . Skin cancer Father   . Osteoarthritis Father   . Hypertension Brother   . Osteoarthritis Brother   . Skin cancer Sister   . Osteoarthritis Sister   . Hypertension Sister   . Heart attack Son     ROS: no fevers or chills, productive cough, hemoptysis, dysphasia, odynophagia, melena, hematochezia, dysuria, hematuria, rash, seizure activity, orthopnea, PND, pedal edema, claudication. Remaining systems are negative.  Physical Exam: Well-developed well-nourished in no acute distress.  Skin is warm and dry.  HEENT is normal.  Neck is supple.  Chest is clear to auscultation with normal expansion.  Cardiovascular exam is regular rate and rhythm.  Abdominal exam nontender or distended. No masses palpated. Extremities show no edema. neuro grossly intact  ECG- personally reviewed  A/P  1  Kirk Ruths, MD

## 2018-08-24 DIAGNOSIS — H25812 Combined forms of age-related cataract, left eye: Secondary | ICD-10-CM | POA: Diagnosis not present

## 2018-08-24 DIAGNOSIS — H25811 Combined forms of age-related cataract, right eye: Secondary | ICD-10-CM | POA: Diagnosis not present

## 2018-08-24 DIAGNOSIS — H2511 Age-related nuclear cataract, right eye: Secondary | ICD-10-CM | POA: Diagnosis not present

## 2018-08-31 ENCOUNTER — Other Ambulatory Visit: Payer: Self-pay | Admitting: Internal Medicine

## 2018-09-04 ENCOUNTER — Ambulatory Visit: Payer: Medicare Other | Admitting: Cardiology

## 2018-09-07 ENCOUNTER — Other Ambulatory Visit: Payer: Self-pay | Admitting: Internal Medicine

## 2018-09-15 DIAGNOSIS — M961 Postlaminectomy syndrome, not elsewhere classified: Secondary | ICD-10-CM | POA: Diagnosis not present

## 2018-09-15 DIAGNOSIS — G894 Chronic pain syndrome: Secondary | ICD-10-CM | POA: Diagnosis not present

## 2018-09-15 DIAGNOSIS — G4700 Insomnia, unspecified: Secondary | ICD-10-CM | POA: Diagnosis not present

## 2018-09-15 DIAGNOSIS — R079 Chest pain, unspecified: Secondary | ICD-10-CM | POA: Diagnosis not present

## 2018-09-16 ENCOUNTER — Other Ambulatory Visit: Payer: Self-pay | Admitting: Internal Medicine

## 2018-09-25 DIAGNOSIS — M25512 Pain in left shoulder: Secondary | ICD-10-CM | POA: Diagnosis not present

## 2018-09-25 DIAGNOSIS — M13812 Other specified arthritis, left shoulder: Secondary | ICD-10-CM | POA: Diagnosis not present

## 2018-09-28 ENCOUNTER — Other Ambulatory Visit: Payer: Self-pay | Admitting: Internal Medicine

## 2018-09-28 DIAGNOSIS — M7061 Trochanteric bursitis, right hip: Secondary | ICD-10-CM | POA: Diagnosis not present

## 2018-09-28 NOTE — Telephone Encounter (Signed)
Refilled: 07/31/2018 Last OV: 03/22/2018 Next OV: 01/18/2019

## 2018-09-29 NOTE — Telephone Encounter (Signed)
Diazepam refilled

## 2018-10-18 DIAGNOSIS — M19022 Primary osteoarthritis, left elbow: Secondary | ICD-10-CM | POA: Diagnosis not present

## 2018-10-18 DIAGNOSIS — M25532 Pain in left wrist: Secondary | ICD-10-CM | POA: Diagnosis not present

## 2018-10-26 DIAGNOSIS — G894 Chronic pain syndrome: Secondary | ICD-10-CM | POA: Diagnosis not present

## 2018-10-26 DIAGNOSIS — R079 Chest pain, unspecified: Secondary | ICD-10-CM | POA: Diagnosis not present

## 2018-10-26 DIAGNOSIS — M961 Postlaminectomy syndrome, not elsewhere classified: Secondary | ICD-10-CM | POA: Diagnosis not present

## 2018-10-26 DIAGNOSIS — G4700 Insomnia, unspecified: Secondary | ICD-10-CM | POA: Diagnosis not present

## 2018-11-10 ENCOUNTER — Other Ambulatory Visit: Payer: Self-pay | Admitting: Internal Medicine

## 2018-11-15 DIAGNOSIS — M25551 Pain in right hip: Secondary | ICD-10-CM | POA: Diagnosis not present

## 2018-11-22 DIAGNOSIS — M961 Postlaminectomy syndrome, not elsewhere classified: Secondary | ICD-10-CM | POA: Diagnosis not present

## 2018-11-22 DIAGNOSIS — G894 Chronic pain syndrome: Secondary | ICD-10-CM | POA: Diagnosis not present

## 2018-11-22 DIAGNOSIS — M7061 Trochanteric bursitis, right hip: Secondary | ICD-10-CM | POA: Diagnosis not present

## 2018-11-22 DIAGNOSIS — M7062 Trochanteric bursitis, left hip: Secondary | ICD-10-CM | POA: Diagnosis not present

## 2018-11-22 DIAGNOSIS — G4700 Insomnia, unspecified: Secondary | ICD-10-CM | POA: Diagnosis not present

## 2018-11-22 DIAGNOSIS — R079 Chest pain, unspecified: Secondary | ICD-10-CM | POA: Diagnosis not present

## 2018-11-23 ENCOUNTER — Telehealth: Payer: Self-pay | Admitting: Internal Medicine

## 2018-11-23 NOTE — Telephone Encounter (Signed)
Pt says that she knows that it is short notice but she is going out of town. Pt says that she has spoken to her insurance company to authorize a early refill on this medication, pt says that she was told by the pharmacy that the PCP has to okay this refill sooner. Pt would like to know if Dr. Derrel Nip would okay an early refill?   Please advise.   [phone]

## 2018-11-23 NOTE — Telephone Encounter (Signed)
Copied from [place] [id number]. Topic: Quick Communication - Rx Refill/Question >> Nov 23, 2018  1:10 PM Andria Frames L wrote: Medication: diazepam (VALIUM) 5 MG tablet [[id number]] needs early refill because pt is going out town   Has the patient contacted their pharmacy? no Preferred Pharmacy (with phone number or street name): [place], [place] [phone] (Phone) [phone] (Fax)  Agent: Please be advised that RX refills may take up to 3 business days. We ask that you follow-up with your pharmacy.

## 2018-11-23 NOTE — Telephone Encounter (Signed)
Call pharmacy if they are ok authorize early refill per me on valium due to going out of town she had 6 month supply from PCP as of 09/2018   [place]

## 2018-11-24 NOTE — Telephone Encounter (Signed)
[facility] has transferred [facility] to [facility] [place] and approval given

## 2018-11-28 ENCOUNTER — Telehealth: Payer: Self-pay | Admitting: Internal Medicine

## 2018-11-28 NOTE — Telephone Encounter (Signed)
Copied from [place] [phone] [id number]. Topic: Quick Communication - Rx Refill/Question >> Nov 28, 2018  1:20 PM Reyne Dumas L wrote: Medication: SYNTHROID 125 MCG tablet  Pt calling to speak with Dr. Demetrios Isaacs nurse.  Pt states she needs a tier exception on SYNTHROID 125 MCG tablet.  Pt states that insurance wants to give her generic, but pt states she can only take name brand only. Ref #: [id number] Pt states that a fax should be received today on this. Pt states she is almost out and it takes the insurance end to get this done through utilization review. Pt can be reached at [phone]

## 2018-11-29 NOTE — Telephone Encounter (Signed)
PA has been completed and faxed. Pt is aware.

## 2018-11-30 NOTE — Telephone Encounter (Signed)
Sonya Cordova with [employer] called to get clinical information regarding pt's Synthroid.  While holding for [facility] to see if someone was available Sonya Cordova disconnected the call.  No callback number.

## 2018-12-05 ENCOUNTER — Telehealth: Payer: Self-pay

## 2018-12-05 DIAGNOSIS — M25552 Pain in left hip: Secondary | ICD-10-CM | POA: Diagnosis not present

## 2018-12-05 NOTE — Telephone Encounter (Signed)
Spoke with pt and informed her that the PA had been denied. Pt stated that she had spoken with her insurance and they stated that an appeal would need to be done.

## 2018-12-05 NOTE — Telephone Encounter (Signed)
Patient called checking on status of PA advised this was faxed to [facility] on 11/29/18 per chart , Looks like [employer] tried to call but hung up.

## 2018-12-05 NOTE — Telephone Encounter (Signed)
Copied from [place] [phone]. Topic: General - Other >> Dec 05, 2018 12:25 PM Leward Quan A wrote: Reason for CRM: Patient called to say that she had filed for a Tier reduction on her Synthroid but it was denied by [facility] and she is requesting an appeal to be filed.  Appeal/fax  Ph# [phone]    Fax# [phone]  Att. Nurse, children's [employer] [address] [place] [id number]

## 2018-12-07 NOTE — Telephone Encounter (Signed)
Appeal submitted

## 2018-12-09 DIAGNOSIS — S0083XA Contusion of other part of head, initial encounter: Secondary | ICD-10-CM | POA: Diagnosis not present

## 2018-12-09 DIAGNOSIS — W01198A Fall on same level from slipping, tripping and stumbling with subsequent striking against other object, initial encounter: Secondary | ICD-10-CM | POA: Diagnosis not present

## 2018-12-09 DIAGNOSIS — Y92511 Restaurant or cafe as the place of occurrence of the external cause: Secondary | ICD-10-CM | POA: Diagnosis not present

## 2018-12-09 DIAGNOSIS — Y998 Other external cause status: Secondary | ICD-10-CM | POA: Diagnosis not present

## 2018-12-09 DIAGNOSIS — Y9301 Activity, walking, marching and hiking: Secondary | ICD-10-CM | POA: Diagnosis not present

## 2018-12-09 DIAGNOSIS — S022XXA Fracture of nasal bones, initial encounter for closed fracture: Secondary | ICD-10-CM | POA: Diagnosis not present

## 2018-12-09 DIAGNOSIS — S01511A Laceration without foreign body of lip, initial encounter: Secondary | ICD-10-CM | POA: Diagnosis not present

## 2018-12-09 DIAGNOSIS — S161XXA Strain of muscle, fascia and tendon at neck level, initial encounter: Secondary | ICD-10-CM | POA: Diagnosis not present

## 2018-12-11 DIAGNOSIS — S52124A Nondisplaced fracture of head of right radius, initial encounter for closed fracture: Secondary | ICD-10-CM | POA: Diagnosis not present

## 2018-12-11 DIAGNOSIS — M13812 Other specified arthritis, left shoulder: Secondary | ICD-10-CM | POA: Diagnosis not present

## 2018-12-11 DIAGNOSIS — M25521 Pain in right elbow: Secondary | ICD-10-CM | POA: Diagnosis not present

## 2018-12-11 DIAGNOSIS — M19012 Primary osteoarthritis, left shoulder: Secondary | ICD-10-CM | POA: Diagnosis not present

## 2018-12-12 DIAGNOSIS — M25552 Pain in left hip: Secondary | ICD-10-CM | POA: Diagnosis not present

## 2018-12-14 DIAGNOSIS — W1830XA Fall on same level, unspecified, initial encounter: Secondary | ICD-10-CM | POA: Diagnosis not present

## 2018-12-14 DIAGNOSIS — S022XXA Fracture of nasal bones, initial encounter for closed fracture: Secondary | ICD-10-CM | POA: Diagnosis not present

## 2018-12-14 DIAGNOSIS — J3489 Other specified disorders of nose and nasal sinuses: Secondary | ICD-10-CM | POA: Diagnosis not present

## 2018-12-14 DIAGNOSIS — J342 Deviated nasal septum: Secondary | ICD-10-CM | POA: Diagnosis not present

## 2018-12-19 DIAGNOSIS — M25551 Pain in right hip: Secondary | ICD-10-CM | POA: Diagnosis not present

## 2018-12-22 DIAGNOSIS — M961 Postlaminectomy syndrome, not elsewhere classified: Secondary | ICD-10-CM | POA: Diagnosis not present

## 2018-12-22 DIAGNOSIS — G894 Chronic pain syndrome: Secondary | ICD-10-CM | POA: Diagnosis not present

## 2018-12-22 DIAGNOSIS — G4700 Insomnia, unspecified: Secondary | ICD-10-CM | POA: Diagnosis not present

## 2018-12-22 DIAGNOSIS — Z79891 Long term (current) use of opiate analgesic: Secondary | ICD-10-CM | POA: Diagnosis not present

## 2018-12-27 ENCOUNTER — Ambulatory Visit: Admission: RE | Admit: 2018-12-27 | Payer: Medicare Other | Source: Home / Self Care | Admitting: Otolaryngology

## 2018-12-27 SURGERY — SEPTOPLASTY, NOSE
Anesthesia: General

## 2019-01-08 DIAGNOSIS — S52124D Nondisplaced fracture of head of right radius, subsequent encounter for closed fracture with routine healing: Secondary | ICD-10-CM | POA: Diagnosis not present

## 2019-01-08 DIAGNOSIS — M13812 Other specified arthritis, left shoulder: Secondary | ICD-10-CM | POA: Diagnosis not present

## 2019-01-18 ENCOUNTER — Ambulatory Visit: Payer: Medicare Other | Admitting: Internal Medicine

## 2019-01-24 ENCOUNTER — Telehealth: Payer: Self-pay | Admitting: Internal Medicine

## 2019-01-24 MED ORDER — ZAFIRLUKAST 20 MG PO TABS: 20.0000 mg | ORAL_TABLET | Freq: Two times a day (BID) | ORAL | 0 refills | Status: DC

## 2019-01-24 NOTE — Addendum Note (Signed)
Addended by: Crecencio Mc on: 01/24/2019 04:01 PM   Modules accepted: Orders

## 2019-01-24 NOTE — Telephone Encounter (Signed)
Singulair is on back order and pt is wanting to know if an alternative can be sent in to pharmacy.

## 2019-01-24 NOTE — Telephone Encounter (Signed)
Patient is calling because she needs a refill on her Singulair which is on back order. She is requesting an alternative be sent to the  [id number] [place], [place] [address] [place] [address] Phone: [phone] Fax: [phone]

## 2019-01-24 NOTE — Telephone Encounter (Signed)
THERE ARE NO OTHER ALTERNATIVES TO SINGULAIR EXCEPT THE GENERIC,  If she can't get the generic she can increase her claritin to twice daily (every 12 hours )

## 2019-01-24 NOTE — Telephone Encounter (Signed)
Correction:  ACCOLATE IS AVAILABLE,  BUT MAY BE PRICEY ,  DOSEIS 20 MG TWICE DAILY  RX SENT TO PHARMACY

## 2019-01-25 NOTE — Telephone Encounter (Signed)
Spoke with pt to let her know that Dr. Derrel Nip has sent in a different medication called Accolate but that it might be pricey and the dose is 20mg  twice daily. I advised the pt to call the pharmacy to get the pricing on the medication and if it is too expensive to let us know because the pharmacy sent [place] a fax stating that they have the montelukast 5mg  in chewable tablets. The pt stated that she is sure that she will be able to chew a tablet but that she would call us back to let us know.

## 2019-01-28 ENCOUNTER — Other Ambulatory Visit: Payer: Self-pay | Admitting: Internal Medicine

## 2019-02-01 DIAGNOSIS — S022XXA Fracture of nasal bones, initial encounter for closed fracture: Secondary | ICD-10-CM | POA: Diagnosis not present

## 2019-02-06 DIAGNOSIS — M5432 Sciatica, left side: Secondary | ICD-10-CM | POA: Diagnosis not present

## 2019-02-06 DIAGNOSIS — R2689 Other abnormalities of gait and mobility: Secondary | ICD-10-CM | POA: Diagnosis not present

## 2019-02-06 DIAGNOSIS — M25562 Pain in left knee: Secondary | ICD-10-CM | POA: Diagnosis not present

## 2019-02-09 DIAGNOSIS — G4700 Insomnia, unspecified: Secondary | ICD-10-CM | POA: Diagnosis not present

## 2019-02-09 DIAGNOSIS — G894 Chronic pain syndrome: Secondary | ICD-10-CM | POA: Diagnosis not present

## 2019-02-09 DIAGNOSIS — Z79891 Long term (current) use of opiate analgesic: Secondary | ICD-10-CM | POA: Diagnosis not present

## 2019-02-09 DIAGNOSIS — M961 Postlaminectomy syndrome, not elsewhere classified: Secondary | ICD-10-CM | POA: Diagnosis not present

## 2019-02-20 ENCOUNTER — Other Ambulatory Visit: Payer: Self-pay | Admitting: Internal Medicine

## 2019-02-20 DIAGNOSIS — R2689 Other abnormalities of gait and mobility: Secondary | ICD-10-CM | POA: Diagnosis not present

## 2019-02-20 DIAGNOSIS — R2681 Unsteadiness on feet: Secondary | ICD-10-CM | POA: Diagnosis not present

## 2019-02-20 DIAGNOSIS — M6281 Muscle weakness (generalized): Secondary | ICD-10-CM | POA: Diagnosis not present

## 2019-02-21 DIAGNOSIS — R350 Frequency of micturition: Secondary | ICD-10-CM | POA: Diagnosis not present

## 2019-02-21 DIAGNOSIS — N3946 Mixed incontinence: Secondary | ICD-10-CM | POA: Diagnosis not present

## 2019-02-21 DIAGNOSIS — R351 Nocturia: Secondary | ICD-10-CM | POA: Diagnosis not present

## 2019-02-28 DIAGNOSIS — R2689 Other abnormalities of gait and mobility: Secondary | ICD-10-CM | POA: Diagnosis not present

## 2019-02-28 DIAGNOSIS — R2681 Unsteadiness on feet: Secondary | ICD-10-CM | POA: Diagnosis not present

## 2019-02-28 DIAGNOSIS — M6281 Muscle weakness (generalized): Secondary | ICD-10-CM | POA: Diagnosis not present

## 2019-03-01 DIAGNOSIS — S022XXD Fracture of nasal bones, subsequent encounter for fracture with routine healing: Secondary | ICD-10-CM | POA: Diagnosis not present

## 2019-03-02 ENCOUNTER — Other Ambulatory Visit: Payer: Self-pay | Admitting: Internal Medicine

## 2019-03-02 ENCOUNTER — Telehealth: Payer: Self-pay | Admitting: Internal Medicine

## 2019-03-02 DIAGNOSIS — M6281 Muscle weakness (generalized): Secondary | ICD-10-CM | POA: Diagnosis not present

## 2019-03-02 DIAGNOSIS — R2681 Unsteadiness on feet: Secondary | ICD-10-CM | POA: Diagnosis not present

## 2019-03-02 DIAGNOSIS — R2689 Other abnormalities of gait and mobility: Secondary | ICD-10-CM | POA: Diagnosis not present

## 2019-03-02 NOTE — Telephone Encounter (Signed)
Patient would like to keep this rx.  She has been doing this for a bit and it helps for her to relax and sleep. She doesn't take one of her morphines. Because of the side effects.  She stated she understands the risk but the benefits in the long run are better. She is constantly in pain from past SX.  This combination works best for her to be able to sleep. If she cant have this medication she understands why.  She says without the medication her sx will become worse as time goes on.

## 2019-03-02 NOTE — Telephone Encounter (Signed)
Copied from [place] [id number]. Topic: Quick Communication - Rx Refill/Question >> Mar 02, 2019 11:10 AM Celene Kras A wrote: Medication: diazepam (VALIUM) 5 MG tablet  Has the patient contacted their pharmacy? Yes.  Moses, from pharmacy, called stating he is concerned about filling this prescription because pt has also been prescribed eszopiclone by another provider. He is wanting to check before he refills it. Please advise.  (Agent: If no, request that the patient contact the pharmacy for the refill.) (Agent: If yes, when and what did the pharmacy advise?)  Preferred Pharmacy (with phone number or street name):[id number] [place], [place] [address] Phone: [phone] Fax: [phone] Not a 24 hour pharmacy; exact hours not known.    Agent: Please be advised that RX refills may take up to 3 business days. We ask that you follow-up with your pharmacy.

## 2019-03-02 NOTE — Telephone Encounter (Signed)
Patient says do you have any other recommendation for her pain the only time she does no hurt is when she is a sleep and the only time she can sleep is when she takes the two medications together, advised patient the reason that she should not combine the two but she insisted I ask PCP for alternative solution.

## 2019-03-02 NOTE — Telephone Encounter (Signed)
Please call patient.  She cannot take both for insomnia and the valium was prescribed for insomnia initially

## 2019-03-02 NOTE — Telephone Encounter (Signed)
She needs to schedule a visit to discuss this , or I can refer her to Pain Management

## 2019-03-02 NOTE — Telephone Encounter (Signed)
Left message for patient to return my call.

## 2019-03-02 NOTE — Telephone Encounter (Signed)
Has the patient contacted their pharmacy? Yes.  Moses, from pharmacy, called stating he is concerned about filling this prescription because pt has also been prescribed eszopiclone by another provider. He is wanting to check before he refills it. Please advise.

## 2019-03-02 NOTE — Telephone Encounter (Signed)
I appreciate her being honest,  But I cannot fill it if she has an active rx for [facility]

## 2019-03-02 NOTE — Telephone Encounter (Signed)
Refilled: 01/24/2019 Last OV: 03/22/2018 Next OV: 07/22/2018

## 2019-03-05 NOTE — Telephone Encounter (Signed)
Scheduled in Office for :30 on Friday.

## 2019-03-06 DIAGNOSIS — R350 Frequency of micturition: Secondary | ICD-10-CM | POA: Diagnosis not present

## 2019-03-07 DIAGNOSIS — R2681 Unsteadiness on feet: Secondary | ICD-10-CM | POA: Diagnosis not present

## 2019-03-07 DIAGNOSIS — R2689 Other abnormalities of gait and mobility: Secondary | ICD-10-CM | POA: Diagnosis not present

## 2019-03-07 DIAGNOSIS — M6281 Muscle weakness (generalized): Secondary | ICD-10-CM | POA: Diagnosis not present

## 2019-03-07 NOTE — Telephone Encounter (Signed)
Patient will keep appointment.

## 2019-03-07 NOTE — Telephone Encounter (Signed)
Pt stated she doesn't feel she needs to talk to Dr. Derrel Nip regarding medication refill as she has been taking it for years. She stated Sonya Cordova can call her back if she needs too. Please advise.

## 2019-03-09 ENCOUNTER — Other Ambulatory Visit: Payer: Self-pay

## 2019-03-09 ENCOUNTER — Ambulatory Visit (INDEPENDENT_AMBULATORY_CARE_PROVIDER_SITE_OTHER): Payer: Medicare Other | Admitting: Internal Medicine

## 2019-03-09 ENCOUNTER — Encounter: Payer: Self-pay | Admitting: Internal Medicine

## 2019-03-09 DIAGNOSIS — T402X5A Adverse effect of other opioids, initial encounter: Secondary | ICD-10-CM | POA: Diagnosis not present

## 2019-03-09 DIAGNOSIS — G8929 Other chronic pain: Secondary | ICD-10-CM | POA: Diagnosis not present

## 2019-03-09 DIAGNOSIS — K5903 Drug induced constipation: Secondary | ICD-10-CM | POA: Diagnosis not present

## 2019-03-09 DIAGNOSIS — G4701 Insomnia due to medical condition: Secondary | ICD-10-CM

## 2019-03-09 DIAGNOSIS — R2681 Unsteadiness on feet: Secondary | ICD-10-CM | POA: Diagnosis not present

## 2019-03-09 DIAGNOSIS — M6281 Muscle weakness (generalized): Secondary | ICD-10-CM | POA: Diagnosis not present

## 2019-03-09 DIAGNOSIS — R2689 Other abnormalities of gait and mobility: Secondary | ICD-10-CM | POA: Diagnosis not present

## 2019-03-09 MED ORDER — DIAZEPAM 5 MG PO TABS: ORAL_TABLET | ORAL | 0 refills | Status: DC

## 2019-03-09 NOTE — Progress Notes (Addendum)
Virtual Visit converted to Telephone Note  This visit type was conducted due to national recommendations for restrictions regarding the COVID-19 pandemic (e.g. social distancing).  This format is felt to be most appropriate for this patient at this time.  All issues noted in this document were discussed and addressed.  No physical exam was performed (except for noted visual exam findings with Video Visits).   I attempted to connect with@ on 03/09/19 at  3:30 PM EDT by a video enabled telemedicine application ; however  Interactive audio and video telecommunications  Ultimately failed, due to patient having technical difficulties.   We continued and completed visit with audio only and verified that I am speaking with the correct person using two identifiers.  Location patient: home Location provider: work or home office Persons participating in the virtual visit: patient, provider  I discussed the limitations, risks, security and privacy concerns of performing an evaluation and management service by telephone and the availability of in person appointments. I also discussed with the patient that there may be a patient responsible charge related to this service. The patient expressed understanding and agreed to proceed. Reason for visit: chronic pain  HPI:  Presents for discussion of medications  Has been on stable doses of morphine, diazepam and lunesta since June 2018.  However a recent refill was questioned by a pharmacist at her pharmacy due to the presence of two prescribing physicians, ,  Which prompted a call to review her use of medications.  Patient has chronic musculoskeletal pain that is diffuse and unrelenting.  She has had 22 procedures on her spine and recently had all hardware removed,  With no significant change. She is under treatment by a pain specialist with stable doses of morphine.  She has been taking Lunesta and diazepam at bedtime to achieve enough sedation to rest and has had  no adverse events.  Recent fall reviewed;  The fall occurred at a public restaurant and was NOT the result of oversedation but due to a partially hidden electrical light at ground level.     ROS: See pertinent positives and negatives per HPI.  Past Medical History:  Diagnosis Date  . Anemia   . Arthritis   . Cancer (HCC)    Basal cell  . Complication of anesthesia    "states remembers surgeon talking- was light"  . Connective tissue disease ([place])   . Contact lens/glasses fitting    wears contacts or glasses  . Degenerative disc disease 1995  . Edema of both legs   . Family history of anesthesia complication   . Headache(784.0)    migraines  . History of blood transfusion   . Hypothyroidism   . Lymphedema   . Neuropathy   . Osteopenia 02/2012   t score -1.1  . PONV (postoperative nausea and vomiting)     Past Surgical History:  Procedure Laterality Date  . abdominal plasty    . APPENDECTOMY    . BACK SURGERY     neck,, cervical, fusion 1995-2010 ( 17 surgeries)  . BREAST ENHANCEMENT SURGERY  1978  . EYE SURGERY     as a child  . HAMMER TOE SURGERY    . hemorrhoid    . KNEE ARTHROSCOPY    . knee replace    . left elbow    . left wrist    . SPINAL CORD STIMULATOR IMPLANT  2011  . THYROIDECTOMY  1986  . TONSILLECTOMY AND ADENOIDECTOMY    . TOTAL KNEE  ARTHROPLASTY     2 times right  . TOTAL KNEE ARTHROPLASTY  05/22/2012   Procedure: TOTAL KNEE ARTHROPLASTY;  Surgeon: Gearlean Alf, MD;  Location: [facility];  Service: Orthopedics;  Laterality: Left;  . TOTAL SHOULDER REPLACEMENT     2 times right shoulder  . TUBAL LIGATION    . ULNAR NERVE TRANSPOSITION Left 06/13/2013   Procedure: DECOMPRESSION, POSSIBLE TRANSPOSITION LEFT ULNAR NERVE;  Surgeon: Wynonia Sours, MD;  Location: [facility];  Service: Orthopedics;  Laterality: Left;  Marland Kitchen VAGINAL HYSTERECTOMY  1986    Family History  Problem Relation Age of Onset  . Hypertension Sister   . Stroke Sister    . Heart disease Sister   . Thyroid disease Sister   . Hypertension Mother   . Heart disease Mother   . Pancreatic cancer Mother   . Skin cancer Mother   . Rheum arthritis Mother   . Hyperlipidemia Mother   . Hypertension Father   . Heart disease Father   . Skin cancer Father   . Osteoarthritis Father   . Hypertension Brother   . Osteoarthritis Brother   . Skin cancer Sister   . Osteoarthritis Sister   . Hypertension Sister   . Heart attack Son     SOCIAL HX:  reports that she quit smoking about 31 years ago. Her smoking use included cigarettes. She has a 20.00 pack-year smoking history. She has never used smokeless tobacco. She reports that she does not drink alcohol or use drugs.   Current Outpatient Medications:  .  azelastine (ASTELIN) 0.1 % nasal spray, Place 1 spray into both nostrils 2 (two) times daily as needed for rhinitis. , Disp: , Rfl:  .  celecoxib (CELEBREX) 200 MG capsule, TAKE ONE CAPSULE BY MOUTH ONE TIME DAILY , Disp: 30 capsule, Rfl: 4 .  Cholecalciferol (VITAMIN D3) 1000 units CAPS, Take 1,000 Units by mouth at bedtime. , Disp: , Rfl:  .  diazepam (VALIUM) 5 MG tablet, TAKE 1 TABLET BY MOUTH EVERY DAY AT BEDTIME AS NEEDED FOR ANXIETY, Disp: 30 tablet, Rfl: 0 .  diphenhydramine-acetaminophen (TYLENOL PM) 25-500 MG TABS tablet, Take 1 tablet by mouth at bedtime., Disp: , Rfl:  .  Eszopiclone (ESZOPICLONE) 3 MG TABS, Take 3 mg by mouth at bedtime. Take immediately before bedtime. Name Brand ONLY, Disp: , Rfl:  .  gabapentin (NEURONTIN) 300 MG capsule, Take 900 mg by mouth 3 (three) times daily. , Disp: , Rfl:  .  ibuprofen (ADVIL,MOTRIN) 200 MG tablet, Take 600 mg by mouth 2 (two) times daily., Disp: , Rfl:  .  ipratropium (ATROVENT) 0.06 % nasal spray, USE TWO SPRAY(S) IN EACH NOSTRIL 4 TIMES DAILY, Disp: 15 mL, Rfl: 5 .  Lactobacillus (PROBIOTIC ACIDOPHILUS PO), Take 1 capsule by mouth daily., Disp: , Rfl:  .  loratadine (CLARITIN) 10 MG tablet, Take 10 mg by  mouth daily., Disp: , Rfl:  .  Melatonin 10 MG TABS, Take 20 mg by mouth at bedtime., Disp: , Rfl:  .  methocarbamol (ROBAXIN) 750 MG tablet, Take 750-2,250 mg by mouth See admin instructions. Take 3 tablets (2250 mg) by mouth daily at bedtime, may also take 1 or 2 tablets ([id number]  mg) during the day every 6 hours as needed for muscle spasms, Disp: , Rfl:  .  morphine (MSIR) 30 MG tablet, Take 30 mg by mouth every 8 (eight) hours. pain, Disp: , Rfl:  .  Multiple Vitamins-Minerals (HAIR SKIN AND NAILS FORMULA  PO), Take 4 tablets by mouth daily., Disp: , Rfl:  .  Multiple Vitamins-Minerals (MULTIVITAMIN ADULT) CHEW, Chew 2 each by mouth daily., Disp: , Rfl:  .  naloxegol oxalate (MOVANTIK) 25 MG TABS tablet, Take 25 mg by mouth daily as needed (constipation). , Disp: , Rfl:  .  nitroGLYCERIN (NITROSTAT) 0.4 MG SL tablet, Place 1 tablet (0.4 mg total) under the tongue every 5 (five) minutes as needed for chest pain., Disp: 50 tablet, Rfl: 3 .  OVER THE COUNTER MEDICATION, Take 3 tablets by mouth daily. Total restore, Disp: , Rfl:  .  OVER THE COUNTER MEDICATION, Take 2 tablets by mouth daily. Dermal Repair Complex, Disp: , Rfl:  .  pantoprazole (PROTONIX) 40 MG tablet, TAKE ONE TABLET BY MOUTH ONE TIME DAILY  (Patient taking differently: Take 40 mg by mouth daily. ), Disp: 180 tablet, Rfl: 1 .  Red Yeast Rice 600 MG CAPS, Take 1,200 mg by mouth 2 (two) times daily. , Disp: , Rfl:  .  SYNTHROID 125 MCG tablet, TAKE 1 TABLET BY MOUTH ONCE DAILY BEFORE BREAKFAST, Disp: 86 tablet, Rfl: 1 .  tiZANidine (ZANAFLEX) 4 MG tablet, Take 1 tablet (4 mg total) by mouth every 6 (six) hours as needed for muscle spasms., Disp: 90 tablet, Rfl: 1 .  torsemide (DEMADEX) 5 MG tablet, TAKE 1 TABLET BY MOUTH ONCE DAILY AS NEEDED (Patient taking differently: Take 5 mg by mouth daily. ), Disp: 90 tablet, Rfl: 1 .  vitamin B-12 (CYANOCOBALAMIN) 1000 MCG tablet, Take 1,000 mcg by mouth daily., Disp: , Rfl:  .  zafirlukast  (ACCOLATE) 20 MG tablet, TAKE 1 TABLET BY MOUTH TWICE A DAY BEFORE A MEAL, Disp: 60 tablet, Rfl: 0 .  montelukast (SINGULAIR) 10 MG tablet, TAKE 1 TABLET BY MOUTH AT BEDTIME (Patient not taking: Reported on 03/09/2019), Disp: 90 tablet, Rfl: 2 .  polyethylene glycol (MIRALAX / GLYCOLAX) packet, Take 17 g by mouth daily as needed (constipation). , Disp: , Rfl:   EXAM:   General impression: alert, cooperative and articulate.  No signs of being in distress  Lungs: speech is fluent sentence length suggests that patient is not short of breath and not punctuated by cough, sneezing or sniffing. Marland Kitchen   Psych: affect normal.  speech is articulate and non pressured .  Denies suicidal thoughts   ASSESSMENT AND PLAN:  Discussed the following assessment and plan:  Insomnia secondary to chronic pain  Constipation due to opioid therapy  Insomnia secondary to chronic pain Managed with stable doss of Lunesta and diazepam.  Refill given for diazepam after discussion today' patient advised that al uture prescriptiosn should come from Dr Hardin Negus, since he is prescribing her morphine.   Constipation due to opioid therapy Managed with Movantik    I discussed the assessment and treatment plan with the patient. The patient was provided an opportunity to ask questions and all were answered. The patient agreed with the plan and demonstrated an understanding of the instructions.   The patient was advised to call back or seek an in-person evaluation if the symptoms worsen or if the condition fails to improve as anticipated.  I provided 15 minutes of non-face-to-face time during this encounter.   Crecencio Mc, MD

## 2019-03-11 NOTE — Assessment & Plan Note (Signed)
Managed with Movantik

## 2019-03-11 NOTE — Assessment & Plan Note (Signed)
Managed with stable doss of Lunesta and diazepam.  Refill given for diazepam after discussion today' patient advised that al uture prescriptiosn should come from Dr Hardin Negus, since he is prescribing her morphine.

## 2019-03-12 ENCOUNTER — Other Ambulatory Visit: Payer: Self-pay | Admitting: Internal Medicine

## 2019-03-12 MED ORDER — MONTELUKAST SODIUM 10 MG PO TABS: 10.0000 mg | ORAL_TABLET | Freq: Every day | ORAL | 2 refills | Status: DC

## 2019-03-12 NOTE — Telephone Encounter (Signed)
Refill sent as requested. 

## 2019-03-12 NOTE — Telephone Encounter (Signed)
Copied from [place] [id number]. Topic: Quick Communication - Rx Refill/Question >> Mar 12, 2019  1:38 PM [place], [place] D wrote: Medication: montelukast (SINGULAIR) 10 MG tablet   Has the patient contacted their pharmacy? Yes.   (Agent: If no, request that the patient contact the pharmacy for the refill.) (Agent: If yes, when and what did the pharmacy advise?)  Preferred Pharmacy (with phone number or street name): [id number] [place], [place] [phone] (Phone) [phone] (Fax)    Agent: Please be advised that RX refills may take up to 3 business days. We ask that you follow-up with your pharmacy.

## 2019-03-13 DIAGNOSIS — R350 Frequency of micturition: Secondary | ICD-10-CM | POA: Diagnosis not present

## 2019-03-13 DIAGNOSIS — Z79891 Long term (current) use of opiate analgesic: Secondary | ICD-10-CM | POA: Diagnosis not present

## 2019-03-13 DIAGNOSIS — M4155 Other secondary scoliosis, thoracolumbar region: Secondary | ICD-10-CM | POA: Diagnosis not present

## 2019-03-13 DIAGNOSIS — M961 Postlaminectomy syndrome, not elsewhere classified: Secondary | ICD-10-CM | POA: Diagnosis not present

## 2019-03-13 DIAGNOSIS — G4700 Insomnia, unspecified: Secondary | ICD-10-CM | POA: Diagnosis not present

## 2019-03-13 DIAGNOSIS — N3946 Mixed incontinence: Secondary | ICD-10-CM | POA: Diagnosis not present

## 2019-03-13 DIAGNOSIS — G894 Chronic pain syndrome: Secondary | ICD-10-CM | POA: Diagnosis not present

## 2019-03-16 DIAGNOSIS — R2689 Other abnormalities of gait and mobility: Secondary | ICD-10-CM | POA: Diagnosis not present

## 2019-03-16 DIAGNOSIS — M6281 Muscle weakness (generalized): Secondary | ICD-10-CM | POA: Diagnosis not present

## 2019-03-16 DIAGNOSIS — R2681 Unsteadiness on feet: Secondary | ICD-10-CM | POA: Diagnosis not present

## 2019-03-19 ENCOUNTER — Other Ambulatory Visit: Payer: Self-pay | Admitting: Internal Medicine

## 2019-03-19 DIAGNOSIS — Z1231 Encounter for screening mammogram for malignant neoplasm of breast: Secondary | ICD-10-CM

## 2019-04-10 ENCOUNTER — Ambulatory Visit
Admission: RE | Admit: 2019-04-10 | Discharge: 2019-04-10 | Disposition: A | Discharge status: Home / Self Care | Payer: Medicare Other | Source: Ambulatory Visit | Attending: Internal Medicine | Admitting: Internal Medicine

## 2019-04-10 ENCOUNTER — Other Ambulatory Visit: Payer: Self-pay

## 2019-04-10 DIAGNOSIS — Z1231 Encounter for screening mammogram for malignant neoplasm of breast: Secondary | ICD-10-CM | POA: Diagnosis not present

## 2019-04-24 ENCOUNTER — Telehealth: Payer: Self-pay | Admitting: Internal Medicine

## 2019-04-24 DIAGNOSIS — Z79891 Long term (current) use of opiate analgesic: Secondary | ICD-10-CM | POA: Diagnosis not present

## 2019-04-24 DIAGNOSIS — G4700 Insomnia, unspecified: Secondary | ICD-10-CM | POA: Diagnosis not present

## 2019-04-24 DIAGNOSIS — M961 Postlaminectomy syndrome, not elsewhere classified: Secondary | ICD-10-CM | POA: Diagnosis not present

## 2019-04-24 DIAGNOSIS — G894 Chronic pain syndrome: Secondary | ICD-10-CM | POA: Diagnosis not present

## 2019-04-24 MED ORDER — DIAZEPAM 5 MG PO TABS: ORAL_TABLET | ORAL | 0 refills | Status: AC

## 2019-04-24 MED ORDER — SYNTHROID 125 MCG PO TABS: ORAL_TABLET | ORAL | 1 refills | Status: DC

## 2019-04-24 MED ORDER — TORSEMIDE 5 MG PO TABS: 5.0000 mg | ORAL_TABLET | Freq: Every day | ORAL | 1 refills | Status: AC | PRN

## 2019-04-24 NOTE — Telephone Encounter (Signed)
She needs to pick a physician and I will make a referral.  I do not have a directory of physicians in that area .   I have printed the prescriptions

## 2019-04-24 NOTE — Telephone Encounter (Signed)
Relation to pt: self  Call back number: [phone]    Reason for call:  Patient relocating to [facility] and in August and patient would like PCP to refer her to a PCP in the area or in the area of [place]. Patient also requesting a hard copy Rx 3 month supply for diazepam (VALIUM) 5 MG tablet , SYNTHROID 125 MCG tablet and torsemide (DEMADEX) 5 MG tablet, patient would like Rx mailed to her home.

## 2019-04-25 NOTE — Telephone Encounter (Signed)
LMTCB

## 2019-04-25 NOTE — Telephone Encounter (Signed)
Spoke with pt to let her know that the rxs will be put in the mail this evening and that she will need to find a doctor down there because Dr. Derrel Nip does not know any of them but that she will make the referral once pt lets her know. Pt gave a verbal understanding.

## 2019-05-15 ENCOUNTER — Other Ambulatory Visit: Payer: Self-pay | Admitting: Physician Assistant

## 2019-05-15 DIAGNOSIS — Z96652 Presence of left artificial knee joint: Secondary | ICD-10-CM

## 2019-05-15 DIAGNOSIS — Z471 Aftercare following joint replacement surgery: Secondary | ICD-10-CM | POA: Diagnosis not present

## 2019-05-21 ENCOUNTER — Other Ambulatory Visit: Payer: Self-pay

## 2019-05-21 ENCOUNTER — Encounter (HOSPITAL_COMMUNITY)
Admission: RE | Admit: 2019-05-21 | Discharge: 2019-05-21 | Disposition: A | Discharge status: Home / Self Care | Payer: Medicare Other | Source: Ambulatory Visit | Attending: Physician Assistant | Admitting: Physician Assistant

## 2019-05-21 DIAGNOSIS — M25562 Pain in left knee: Secondary | ICD-10-CM | POA: Diagnosis not present

## 2019-05-21 DIAGNOSIS — Z96652 Presence of left artificial knee joint: Secondary | ICD-10-CM | POA: Diagnosis not present

## 2019-05-21 DIAGNOSIS — Z96653 Presence of artificial knee joint, bilateral: Secondary | ICD-10-CM | POA: Diagnosis not present

## 2019-05-21 MED ORDER — TECHNETIUM TC 99M MEDRONATE IV KIT
20.0000 | PACK | Freq: Once | INTRAVENOUS | Status: AC | PRN
  Administered 2019-05-21: 19 via INTRAVENOUS

## 2019-05-26 DIAGNOSIS — Z23 Encounter for immunization: Secondary | ICD-10-CM | POA: Diagnosis not present

## 2019-05-29 DIAGNOSIS — M25562 Pain in left knee: Secondary | ICD-10-CM | POA: Diagnosis not present

## 2019-05-29 DIAGNOSIS — Z96652 Presence of left artificial knee joint: Secondary | ICD-10-CM | POA: Diagnosis not present

## 2019-06-13 DIAGNOSIS — M459 Ankylosing spondylitis of unspecified sites in spine: Secondary | ICD-10-CM | POA: Diagnosis not present

## 2019-06-13 DIAGNOSIS — Z79891 Long term (current) use of opiate analgesic: Secondary | ICD-10-CM | POA: Diagnosis not present

## 2019-06-13 DIAGNOSIS — M7061 Trochanteric bursitis, right hip: Secondary | ICD-10-CM | POA: Diagnosis not present

## 2019-06-13 DIAGNOSIS — M542 Cervicalgia: Secondary | ICD-10-CM | POA: Diagnosis not present

## 2019-06-13 DIAGNOSIS — M5410 Radiculopathy, site unspecified: Secondary | ICD-10-CM | POA: Diagnosis not present

## 2019-06-18 DIAGNOSIS — M7061 Trochanteric bursitis, right hip: Secondary | ICD-10-CM | POA: Diagnosis not present

## 2019-06-26 DIAGNOSIS — L0390 Cellulitis, unspecified: Secondary | ICD-10-CM | POA: Diagnosis not present

## 2019-06-26 DIAGNOSIS — Z6820 Body mass index (BMI) 20.0-20.9, adult: Secondary | ICD-10-CM | POA: Diagnosis not present

## 2019-06-28 DIAGNOSIS — Z96653 Presence of artificial knee joint, bilateral: Secondary | ICD-10-CM | POA: Diagnosis not present

## 2019-06-28 DIAGNOSIS — M25562 Pain in left knee: Secondary | ICD-10-CM | POA: Diagnosis not present

## 2019-06-28 DIAGNOSIS — R600 Localized edema: Secondary | ICD-10-CM | POA: Diagnosis not present

## 2019-06-28 DIAGNOSIS — I872 Venous insufficiency (chronic) (peripheral): Secondary | ICD-10-CM | POA: Diagnosis not present

## 2019-06-29 DIAGNOSIS — M1712 Unilateral primary osteoarthritis, left knee: Secondary | ICD-10-CM | POA: Diagnosis not present

## 2019-06-29 DIAGNOSIS — R600 Localized edema: Secondary | ICD-10-CM | POA: Diagnosis not present

## 2019-06-29 DIAGNOSIS — M25562 Pain in left knee: Secondary | ICD-10-CM | POA: Diagnosis not present

## 2019-06-29 DIAGNOSIS — I872 Venous insufficiency (chronic) (peripheral): Secondary | ICD-10-CM | POA: Diagnosis not present

## 2019-06-29 DIAGNOSIS — Z96653 Presence of artificial knee joint, bilateral: Secondary | ICD-10-CM | POA: Diagnosis not present

## 2019-07-02 DIAGNOSIS — E876 Hypokalemia: Secondary | ICD-10-CM | POA: Diagnosis not present

## 2019-07-02 DIAGNOSIS — Z01818 Encounter for other preprocedural examination: Secondary | ICD-10-CM | POA: Diagnosis not present

## 2019-07-02 DIAGNOSIS — E039 Hypothyroidism, unspecified: Secondary | ICD-10-CM | POA: Diagnosis not present

## 2019-07-02 DIAGNOSIS — K5903 Drug induced constipation: Secondary | ICD-10-CM | POA: Diagnosis not present

## 2019-07-02 DIAGNOSIS — T8459XA Infection and inflammatory reaction due to other internal joint prosthesis, initial encounter: Secondary | ICD-10-CM | POA: Diagnosis not present

## 2019-07-02 DIAGNOSIS — I890 Lymphedema, not elsewhere classified: Secondary | ICD-10-CM | POA: Diagnosis not present

## 2019-07-02 DIAGNOSIS — G629 Polyneuropathy, unspecified: Secondary | ICD-10-CM | POA: Diagnosis not present

## 2019-07-02 DIAGNOSIS — G8929 Other chronic pain: Secondary | ICD-10-CM | POA: Diagnosis not present

## 2019-07-03 DIAGNOSIS — G8918 Other acute postprocedural pain: Secondary | ICD-10-CM | POA: Diagnosis not present

## 2019-07-03 DIAGNOSIS — G8929 Other chronic pain: Secondary | ICD-10-CM | POA: Diagnosis not present

## 2019-07-03 DIAGNOSIS — I890 Lymphedema, not elsewhere classified: Secondary | ICD-10-CM | POA: Diagnosis not present

## 2019-07-03 DIAGNOSIS — E876 Hypokalemia: Secondary | ICD-10-CM | POA: Diagnosis not present

## 2019-07-03 DIAGNOSIS — T8454XA Infection and inflammatory reaction due to internal left knee prosthesis, initial encounter: Secondary | ICD-10-CM | POA: Diagnosis not present

## 2019-07-03 DIAGNOSIS — K5903 Drug induced constipation: Secondary | ICD-10-CM | POA: Diagnosis not present

## 2019-07-03 DIAGNOSIS — G629 Polyneuropathy, unspecified: Secondary | ICD-10-CM | POA: Diagnosis not present

## 2019-07-03 DIAGNOSIS — Z01818 Encounter for other preprocedural examination: Secondary | ICD-10-CM | POA: Diagnosis not present

## 2019-07-03 DIAGNOSIS — M25562 Pain in left knee: Secondary | ICD-10-CM | POA: Diagnosis not present

## 2019-07-03 DIAGNOSIS — Z471 Aftercare following joint replacement surgery: Secondary | ICD-10-CM | POA: Diagnosis not present

## 2019-07-03 DIAGNOSIS — B965 Pseudomonas (aeruginosa) (mallei) (pseudomallei) as the cause of diseases classified elsewhere: Secondary | ICD-10-CM | POA: Diagnosis not present

## 2019-07-03 DIAGNOSIS — Z96652 Presence of left artificial knee joint: Secondary | ICD-10-CM | POA: Diagnosis not present

## 2019-07-03 DIAGNOSIS — E039 Hypothyroidism, unspecified: Secondary | ICD-10-CM | POA: Diagnosis not present

## 2019-07-04 DIAGNOSIS — K5903 Drug induced constipation: Secondary | ICD-10-CM | POA: Diagnosis not present

## 2019-07-04 DIAGNOSIS — E039 Hypothyroidism, unspecified: Secondary | ICD-10-CM | POA: Diagnosis not present

## 2019-07-04 DIAGNOSIS — E876 Hypokalemia: Secondary | ICD-10-CM | POA: Diagnosis not present

## 2019-07-04 DIAGNOSIS — I890 Lymphedema, not elsewhere classified: Secondary | ICD-10-CM | POA: Diagnosis not present

## 2019-07-04 DIAGNOSIS — G8929 Other chronic pain: Secondary | ICD-10-CM | POA: Diagnosis not present

## 2019-07-04 DIAGNOSIS — I959 Hypotension, unspecified: Secondary | ICD-10-CM | POA: Diagnosis not present

## 2019-07-04 DIAGNOSIS — G629 Polyneuropathy, unspecified: Secondary | ICD-10-CM | POA: Diagnosis not present

## 2019-07-04 DIAGNOSIS — Z743 Need for continuous supervision: Secondary | ICD-10-CM | POA: Diagnosis not present

## 2019-07-05 DIAGNOSIS — I890 Lymphedema, not elsewhere classified: Secondary | ICD-10-CM | POA: Diagnosis not present

## 2019-07-05 DIAGNOSIS — E876 Hypokalemia: Secondary | ICD-10-CM | POA: Diagnosis not present

## 2019-07-05 DIAGNOSIS — G8929 Other chronic pain: Secondary | ICD-10-CM | POA: Diagnosis not present

## 2019-07-05 DIAGNOSIS — E039 Hypothyroidism, unspecified: Secondary | ICD-10-CM | POA: Diagnosis not present

## 2019-07-05 DIAGNOSIS — K5903 Drug induced constipation: Secondary | ICD-10-CM | POA: Diagnosis not present

## 2019-07-05 DIAGNOSIS — G629 Polyneuropathy, unspecified: Secondary | ICD-10-CM | POA: Diagnosis not present

## 2019-07-06 DIAGNOSIS — Z452 Encounter for adjustment and management of vascular access device: Secondary | ICD-10-CM | POA: Diagnosis not present

## 2019-07-06 DIAGNOSIS — Z1159 Encounter for screening for other viral diseases: Secondary | ICD-10-CM | POA: Diagnosis not present

## 2019-07-06 DIAGNOSIS — D500 Iron deficiency anemia secondary to blood loss (chronic): Secondary | ICD-10-CM | POA: Diagnosis not present

## 2019-07-06 DIAGNOSIS — Z96611 Presence of right artificial shoulder joint: Secondary | ICD-10-CM | POA: Diagnosis present

## 2019-07-06 DIAGNOSIS — T40605A Adverse effect of unspecified narcotics, initial encounter: Secondary | ICD-10-CM | POA: Diagnosis present

## 2019-07-06 DIAGNOSIS — I959 Hypotension, unspecified: Secondary | ICD-10-CM | POA: Diagnosis not present

## 2019-07-06 DIAGNOSIS — G8918 Other acute postprocedural pain: Secondary | ICD-10-CM | POA: Diagnosis not present

## 2019-07-06 DIAGNOSIS — R061 Stridor: Secondary | ICD-10-CM | POA: Diagnosis not present

## 2019-07-06 DIAGNOSIS — Z79890 Hormone replacement therapy (postmenopausal): Secondary | ICD-10-CM | POA: Diagnosis not present

## 2019-07-06 DIAGNOSIS — K5903 Drug induced constipation: Secondary | ICD-10-CM | POA: Diagnosis present

## 2019-07-06 DIAGNOSIS — R2681 Unsteadiness on feet: Secondary | ICD-10-CM | POA: Diagnosis not present

## 2019-07-06 DIAGNOSIS — F339 Major depressive disorder, recurrent, unspecified: Secondary | ICD-10-CM | POA: Diagnosis not present

## 2019-07-06 DIAGNOSIS — Z87891 Personal history of nicotine dependence: Secondary | ICD-10-CM | POA: Diagnosis not present

## 2019-07-06 DIAGNOSIS — M545 Low back pain: Secondary | ICD-10-CM | POA: Diagnosis not present

## 2019-07-06 DIAGNOSIS — D62 Acute posthemorrhagic anemia: Secondary | ICD-10-CM | POA: Diagnosis not present

## 2019-07-06 DIAGNOSIS — Z96652 Presence of left artificial knee joint: Secondary | ICD-10-CM | POA: Diagnosis not present

## 2019-07-06 DIAGNOSIS — Z9682 Presence of neurostimulator: Secondary | ICD-10-CM | POA: Diagnosis not present

## 2019-07-06 DIAGNOSIS — T8454XD Infection and inflammatory reaction due to internal left knee prosthesis, subsequent encounter: Secondary | ICD-10-CM | POA: Diagnosis not present

## 2019-07-06 DIAGNOSIS — R451 Restlessness and agitation: Secondary | ICD-10-CM | POA: Diagnosis not present

## 2019-07-06 DIAGNOSIS — Z888 Allergy status to other drugs, medicaments and biological substances status: Secondary | ICD-10-CM | POA: Diagnosis not present

## 2019-07-06 DIAGNOSIS — R769 Abnormal immunological finding in serum, unspecified: Secondary | ICD-10-CM | POA: Diagnosis not present

## 2019-07-06 DIAGNOSIS — G629 Polyneuropathy, unspecified: Secondary | ICD-10-CM | POA: Diagnosis present

## 2019-07-06 DIAGNOSIS — M62838 Other muscle spasm: Secondary | ICD-10-CM | POA: Diagnosis not present

## 2019-07-06 DIAGNOSIS — G8929 Other chronic pain: Secondary | ICD-10-CM | POA: Diagnosis not present

## 2019-07-06 DIAGNOSIS — D696 Thrombocytopenia, unspecified: Secondary | ICD-10-CM | POA: Diagnosis not present

## 2019-07-06 DIAGNOSIS — Z885 Allergy status to narcotic agent status: Secondary | ICD-10-CM | POA: Diagnosis not present

## 2019-07-06 DIAGNOSIS — M6281 Muscle weakness (generalized): Secondary | ICD-10-CM | POA: Diagnosis not present

## 2019-07-06 DIAGNOSIS — I890 Lymphedema, not elsewhere classified: Secondary | ICD-10-CM | POA: Diagnosis not present

## 2019-07-06 DIAGNOSIS — Z96653 Presence of artificial knee joint, bilateral: Secondary | ICD-10-CM | POA: Diagnosis present

## 2019-07-06 DIAGNOSIS — F3289 Other specified depressive episodes: Secondary | ICD-10-CM | POA: Diagnosis not present

## 2019-07-06 DIAGNOSIS — Z20828 Contact with and (suspected) exposure to other viral communicable diseases: Secondary | ICD-10-CM | POA: Diagnosis present

## 2019-07-06 DIAGNOSIS — E876 Hypokalemia: Secondary | ICD-10-CM | POA: Diagnosis present

## 2019-07-06 DIAGNOSIS — Z792 Long term (current) use of antibiotics: Secondary | ICD-10-CM | POA: Diagnosis not present

## 2019-07-06 DIAGNOSIS — E039 Hypothyroidism, unspecified: Secondary | ICD-10-CM | POA: Diagnosis present

## 2019-07-06 DIAGNOSIS — T8454XA Infection and inflammatory reaction due to internal left knee prosthesis, initial encounter: Secondary | ICD-10-CM | POA: Diagnosis present

## 2019-07-06 DIAGNOSIS — R52 Pain, unspecified: Secondary | ICD-10-CM | POA: Diagnosis not present

## 2019-07-06 DIAGNOSIS — M25462 Effusion, left knee: Secondary | ICD-10-CM | POA: Diagnosis not present

## 2019-07-06 DIAGNOSIS — Z743 Need for continuous supervision: Secondary | ICD-10-CM | POA: Diagnosis not present

## 2019-07-06 DIAGNOSIS — B965 Pseudomonas (aeruginosa) (mallei) (pseudomallei) as the cause of diseases classified elsewhere: Secondary | ICD-10-CM | POA: Diagnosis not present

## 2019-07-06 DIAGNOSIS — Z881 Allergy status to other antibiotic agents status: Secondary | ICD-10-CM | POA: Diagnosis not present

## 2019-07-06 DIAGNOSIS — J019 Acute sinusitis, unspecified: Secondary | ICD-10-CM | POA: Diagnosis not present

## 2019-07-06 DIAGNOSIS — R601 Generalized edema: Secondary | ICD-10-CM | POA: Diagnosis not present

## 2019-07-06 DIAGNOSIS — Z4733 Aftercare following explantation of knee joint prosthesis: Secondary | ICD-10-CM | POA: Diagnosis not present

## 2019-07-08 DIAGNOSIS — D62 Acute posthemorrhagic anemia: Secondary | ICD-10-CM | POA: Diagnosis not present

## 2019-07-08 DIAGNOSIS — M545 Low back pain: Secondary | ICD-10-CM | POA: Diagnosis not present

## 2019-07-08 DIAGNOSIS — B965 Pseudomonas (aeruginosa) (mallei) (pseudomallei) as the cause of diseases classified elsewhere: Secondary | ICD-10-CM | POA: Diagnosis not present

## 2019-07-08 DIAGNOSIS — G8929 Other chronic pain: Secondary | ICD-10-CM | POA: Diagnosis not present

## 2019-07-09 DIAGNOSIS — G8918 Other acute postprocedural pain: Secondary | ICD-10-CM | POA: Diagnosis not present

## 2019-07-09 DIAGNOSIS — T8454XD Infection and inflammatory reaction due to internal left knee prosthesis, subsequent encounter: Secondary | ICD-10-CM | POA: Diagnosis not present

## 2019-07-09 DIAGNOSIS — M6281 Muscle weakness (generalized): Secondary | ICD-10-CM | POA: Diagnosis not present

## 2019-07-09 DIAGNOSIS — G8929 Other chronic pain: Secondary | ICD-10-CM | POA: Diagnosis not present

## 2019-07-10 DIAGNOSIS — Z452 Encounter for adjustment and management of vascular access device: Secondary | ICD-10-CM | POA: Diagnosis not present

## 2019-07-10 DIAGNOSIS — Z4733 Aftercare following explantation of knee joint prosthesis: Secondary | ICD-10-CM | POA: Diagnosis not present

## 2019-07-11 DIAGNOSIS — T8454XD Infection and inflammatory reaction due to internal left knee prosthesis, subsequent encounter: Secondary | ICD-10-CM | POA: Diagnosis not present

## 2019-07-11 DIAGNOSIS — R451 Restlessness and agitation: Secondary | ICD-10-CM | POA: Diagnosis not present

## 2019-07-11 DIAGNOSIS — M6281 Muscle weakness (generalized): Secondary | ICD-10-CM | POA: Diagnosis not present

## 2019-07-11 DIAGNOSIS — B965 Pseudomonas (aeruginosa) (mallei) (pseudomallei) as the cause of diseases classified elsewhere: Secondary | ICD-10-CM | POA: Diagnosis not present

## 2019-07-12 DIAGNOSIS — T8454XD Infection and inflammatory reaction due to internal left knee prosthesis, subsequent encounter: Secondary | ICD-10-CM | POA: Diagnosis not present

## 2019-07-12 DIAGNOSIS — R451 Restlessness and agitation: Secondary | ICD-10-CM | POA: Diagnosis not present

## 2019-07-12 DIAGNOSIS — M6281 Muscle weakness (generalized): Secondary | ICD-10-CM | POA: Diagnosis not present

## 2019-07-12 DIAGNOSIS — B965 Pseudomonas (aeruginosa) (mallei) (pseudomallei) as the cause of diseases classified elsewhere: Secondary | ICD-10-CM | POA: Diagnosis not present

## 2019-07-13 DIAGNOSIS — T8454XD Infection and inflammatory reaction due to internal left knee prosthesis, subsequent encounter: Secondary | ICD-10-CM | POA: Diagnosis not present

## 2019-07-13 DIAGNOSIS — R52 Pain, unspecified: Secondary | ICD-10-CM | POA: Diagnosis not present

## 2019-07-13 DIAGNOSIS — B965 Pseudomonas (aeruginosa) (mallei) (pseudomallei) as the cause of diseases classified elsewhere: Secondary | ICD-10-CM | POA: Diagnosis not present

## 2019-07-14 ENCOUNTER — Other Ambulatory Visit: Payer: Self-pay | Admitting: Internal Medicine

## 2019-07-16 DIAGNOSIS — T8454XD Infection and inflammatory reaction due to internal left knee prosthesis, subsequent encounter: Secondary | ICD-10-CM | POA: Diagnosis not present

## 2019-07-16 DIAGNOSIS — G8929 Other chronic pain: Secondary | ICD-10-CM | POA: Diagnosis not present

## 2019-07-16 DIAGNOSIS — B965 Pseudomonas (aeruginosa) (mallei) (pseudomallei) as the cause of diseases classified elsewhere: Secondary | ICD-10-CM | POA: Diagnosis not present

## 2019-07-16 DIAGNOSIS — M6281 Muscle weakness (generalized): Secondary | ICD-10-CM | POA: Diagnosis not present

## 2019-07-17 ENCOUNTER — Telehealth: Payer: Self-pay | Admitting: Internal Medicine

## 2019-07-17 DIAGNOSIS — B965 Pseudomonas (aeruginosa) (mallei) (pseudomallei) as the cause of diseases classified elsewhere: Secondary | ICD-10-CM | POA: Diagnosis not present

## 2019-07-17 DIAGNOSIS — T8454XD Infection and inflammatory reaction due to internal left knee prosthesis, subsequent encounter: Secondary | ICD-10-CM | POA: Diagnosis not present

## 2019-07-17 DIAGNOSIS — D62 Acute posthemorrhagic anemia: Secondary | ICD-10-CM | POA: Diagnosis not present

## 2019-07-17 NOTE — Telephone Encounter (Signed)
-----   Message from Salvatore Marvel sent at 07/16/2019  3:39 PM EDT ----- Regarding: remove pcp [place]  Thank you

## 2019-07-17 NOTE — Telephone Encounter (Signed)
PCP has been removed

## 2019-07-18 DIAGNOSIS — Z96652 Presence of left artificial knee joint: Secondary | ICD-10-CM | POA: Diagnosis not present

## 2019-07-18 DIAGNOSIS — Z4733 Aftercare following explantation of knee joint prosthesis: Secondary | ICD-10-CM | POA: Diagnosis not present

## 2019-07-19 DIAGNOSIS — M6281 Muscle weakness (generalized): Secondary | ICD-10-CM | POA: Diagnosis not present

## 2019-07-19 DIAGNOSIS — G8918 Other acute postprocedural pain: Secondary | ICD-10-CM | POA: Diagnosis not present

## 2019-07-19 DIAGNOSIS — B965 Pseudomonas (aeruginosa) (mallei) (pseudomallei) as the cause of diseases classified elsewhere: Secondary | ICD-10-CM | POA: Diagnosis not present

## 2019-07-19 DIAGNOSIS — T8454XD Infection and inflammatory reaction due to internal left knee prosthesis, subsequent encounter: Secondary | ICD-10-CM | POA: Diagnosis not present

## 2019-07-23 ENCOUNTER — Ambulatory Visit: Payer: Medicare Other

## 2019-07-23 ENCOUNTER — Ambulatory Visit: Payer: Medicare Other | Admitting: Internal Medicine

## 2019-07-23 DIAGNOSIS — B965 Pseudomonas (aeruginosa) (mallei) (pseudomallei) as the cause of diseases classified elsewhere: Secondary | ICD-10-CM | POA: Diagnosis not present

## 2019-07-23 DIAGNOSIS — F339 Major depressive disorder, recurrent, unspecified: Secondary | ICD-10-CM | POA: Diagnosis not present

## 2019-07-23 DIAGNOSIS — G8929 Other chronic pain: Secondary | ICD-10-CM | POA: Diagnosis not present

## 2019-07-23 DIAGNOSIS — Z792 Long term (current) use of antibiotics: Secondary | ICD-10-CM | POA: Diagnosis not present

## 2019-07-23 DIAGNOSIS — T8454XD Infection and inflammatory reaction due to internal left knee prosthesis, subsequent encounter: Secondary | ICD-10-CM | POA: Diagnosis not present

## 2019-07-27 DIAGNOSIS — B965 Pseudomonas (aeruginosa) (mallei) (pseudomallei) as the cause of diseases classified elsewhere: Secondary | ICD-10-CM | POA: Diagnosis not present

## 2019-07-27 DIAGNOSIS — D62 Acute posthemorrhagic anemia: Secondary | ICD-10-CM | POA: Diagnosis not present

## 2019-07-27 DIAGNOSIS — T8454XD Infection and inflammatory reaction due to internal left knee prosthesis, subsequent encounter: Secondary | ICD-10-CM | POA: Diagnosis not present

## 2019-07-27 DIAGNOSIS — R52 Pain, unspecified: Secondary | ICD-10-CM | POA: Diagnosis not present

## 2019-07-28 DIAGNOSIS — T8454XD Infection and inflammatory reaction due to internal left knee prosthesis, subsequent encounter: Secondary | ICD-10-CM | POA: Diagnosis not present

## 2019-07-30 DIAGNOSIS — T8454XD Infection and inflammatory reaction due to internal left knee prosthesis, subsequent encounter: Secondary | ICD-10-CM | POA: Diagnosis not present

## 2019-07-30 DIAGNOSIS — M6281 Muscle weakness (generalized): Secondary | ICD-10-CM | POA: Diagnosis not present

## 2019-07-30 DIAGNOSIS — J019 Acute sinusitis, unspecified: Secondary | ICD-10-CM | POA: Diagnosis not present

## 2019-07-30 DIAGNOSIS — R601 Generalized edema: Secondary | ICD-10-CM | POA: Diagnosis not present

## 2019-08-02 DIAGNOSIS — B965 Pseudomonas (aeruginosa) (mallei) (pseudomallei) as the cause of diseases classified elsewhere: Secondary | ICD-10-CM | POA: Diagnosis not present

## 2019-08-02 DIAGNOSIS — T8454XD Infection and inflammatory reaction due to internal left knee prosthesis, subsequent encounter: Secondary | ICD-10-CM | POA: Diagnosis not present

## 2019-08-02 DIAGNOSIS — J019 Acute sinusitis, unspecified: Secondary | ICD-10-CM | POA: Diagnosis not present

## 2019-08-02 DIAGNOSIS — R061 Stridor: Secondary | ICD-10-CM | POA: Diagnosis not present

## 2019-08-04 ENCOUNTER — Ambulatory Visit: Admit: 2019-08-04 | Payer: Medicare Other | Admitting: Otolaryngology

## 2019-08-04 SURGERY — SEPTOPLASTY, NOSE
Anesthesia: General | Site: Nose

## 2019-08-06 DIAGNOSIS — Z4733 Aftercare following explantation of knee joint prosthesis: Secondary | ICD-10-CM | POA: Diagnosis not present

## 2019-08-07 DIAGNOSIS — D696 Thrombocytopenia, unspecified: Secondary | ICD-10-CM | POA: Diagnosis not present

## 2019-08-07 DIAGNOSIS — T8454XD Infection and inflammatory reaction due to internal left knee prosthesis, subsequent encounter: Secondary | ICD-10-CM | POA: Diagnosis not present

## 2019-08-07 DIAGNOSIS — B965 Pseudomonas (aeruginosa) (mallei) (pseudomallei) as the cause of diseases classified elsewhere: Secondary | ICD-10-CM | POA: Diagnosis not present

## 2019-08-07 DIAGNOSIS — D62 Acute posthemorrhagic anemia: Secondary | ICD-10-CM | POA: Diagnosis not present

## 2019-08-08 DIAGNOSIS — D500 Iron deficiency anemia secondary to blood loss (chronic): Secondary | ICD-10-CM | POA: Diagnosis not present

## 2019-08-08 DIAGNOSIS — M6281 Muscle weakness (generalized): Secondary | ICD-10-CM | POA: Diagnosis not present

## 2019-08-08 DIAGNOSIS — G8929 Other chronic pain: Secondary | ICD-10-CM | POA: Diagnosis not present

## 2019-08-08 DIAGNOSIS — T8454XD Infection and inflammatory reaction due to internal left knee prosthesis, subsequent encounter: Secondary | ICD-10-CM | POA: Diagnosis not present

## 2019-08-09 DIAGNOSIS — T8454XD Infection and inflammatory reaction due to internal left knee prosthesis, subsequent encounter: Secondary | ICD-10-CM | POA: Diagnosis not present

## 2019-08-09 DIAGNOSIS — R769 Abnormal immunological finding in serum, unspecified: Secondary | ICD-10-CM | POA: Diagnosis not present

## 2019-08-13 ENCOUNTER — Encounter: Payer: Self-pay | Admitting: Internal Medicine

## 2019-08-13 DIAGNOSIS — F339 Major depressive disorder, recurrent, unspecified: Secondary | ICD-10-CM | POA: Diagnosis not present

## 2019-08-13 DIAGNOSIS — T8454XS Infection and inflammatory reaction due to internal left knee prosthesis, sequela: Secondary | ICD-10-CM | POA: Insufficient documentation

## 2019-08-13 DIAGNOSIS — T8454XD Infection and inflammatory reaction due to internal left knee prosthesis, subsequent encounter: Secondary | ICD-10-CM | POA: Diagnosis not present

## 2019-08-13 DIAGNOSIS — B965 Pseudomonas (aeruginosa) (mallei) (pseudomallei) as the cause of diseases classified elsewhere: Secondary | ICD-10-CM | POA: Diagnosis not present

## 2019-08-13 DIAGNOSIS — Z792 Long term (current) use of antibiotics: Secondary | ICD-10-CM | POA: Diagnosis not present

## 2019-08-15 DIAGNOSIS — Z96652 Presence of left artificial knee joint: Secondary | ICD-10-CM | POA: Diagnosis not present

## 2019-08-15 DIAGNOSIS — T8454XD Infection and inflammatory reaction due to internal left knee prosthesis, subsequent encounter: Secondary | ICD-10-CM | POA: Diagnosis not present

## 2019-08-16 DIAGNOSIS — G8929 Other chronic pain: Secondary | ICD-10-CM | POA: Diagnosis not present

## 2019-08-16 DIAGNOSIS — M62838 Other muscle spasm: Secondary | ICD-10-CM | POA: Diagnosis not present

## 2019-08-16 DIAGNOSIS — M6281 Muscle weakness (generalized): Secondary | ICD-10-CM | POA: Diagnosis not present

## 2019-08-16 DIAGNOSIS — T8454XD Infection and inflammatory reaction due to internal left knee prosthesis, subsequent encounter: Secondary | ICD-10-CM | POA: Diagnosis not present

## 2019-08-20 DIAGNOSIS — M25562 Pain in left knee: Secondary | ICD-10-CM | POA: Diagnosis not present

## 2019-08-20 DIAGNOSIS — R262 Difficulty in walking, not elsewhere classified: Secondary | ICD-10-CM | POA: Diagnosis not present

## 2019-08-20 DIAGNOSIS — Z96652 Presence of left artificial knee joint: Secondary | ICD-10-CM | POA: Diagnosis not present

## 2019-08-22 DIAGNOSIS — M79642 Pain in left hand: Secondary | ICD-10-CM | POA: Diagnosis not present

## 2019-08-22 DIAGNOSIS — M2392 Unspecified internal derangement of left knee: Secondary | ICD-10-CM | POA: Diagnosis not present

## 2019-08-22 DIAGNOSIS — M25532 Pain in left wrist: Secondary | ICD-10-CM | POA: Diagnosis not present

## 2019-08-22 DIAGNOSIS — M459 Ankylosing spondylitis of unspecified sites in spine: Secondary | ICD-10-CM | POA: Diagnosis not present

## 2019-08-22 DIAGNOSIS — M25531 Pain in right wrist: Secondary | ICD-10-CM | POA: Diagnosis not present

## 2019-08-22 DIAGNOSIS — M79641 Pain in right hand: Secondary | ICD-10-CM | POA: Diagnosis not present

## 2019-08-27 DIAGNOSIS — R262 Difficulty in walking, not elsewhere classified: Secondary | ICD-10-CM | POA: Diagnosis not present

## 2019-08-27 DIAGNOSIS — M25562 Pain in left knee: Secondary | ICD-10-CM | POA: Diagnosis not present

## 2019-08-27 DIAGNOSIS — Z96652 Presence of left artificial knee joint: Secondary | ICD-10-CM | POA: Diagnosis not present

## 2019-08-29 DIAGNOSIS — Z96652 Presence of left artificial knee joint: Secondary | ICD-10-CM | POA: Diagnosis not present

## 2019-08-29 DIAGNOSIS — R262 Difficulty in walking, not elsewhere classified: Secondary | ICD-10-CM | POA: Diagnosis not present

## 2019-08-29 DIAGNOSIS — M25562 Pain in left knee: Secondary | ICD-10-CM | POA: Diagnosis not present

## 2019-08-31 DIAGNOSIS — T8454XA Infection and inflammatory reaction due to internal left knee prosthesis, initial encounter: Secondary | ICD-10-CM | POA: Diagnosis not present

## 2019-08-31 DIAGNOSIS — T8454XD Infection and inflammatory reaction due to internal left knee prosthesis, subsequent encounter: Secondary | ICD-10-CM | POA: Diagnosis not present

## 2019-09-04 DIAGNOSIS — M25562 Pain in left knee: Secondary | ICD-10-CM | POA: Diagnosis not present

## 2019-09-04 DIAGNOSIS — R262 Difficulty in walking, not elsewhere classified: Secondary | ICD-10-CM | POA: Diagnosis not present

## 2019-09-04 DIAGNOSIS — Z96652 Presence of left artificial knee joint: Secondary | ICD-10-CM | POA: Diagnosis not present

## 2019-09-06 DIAGNOSIS — Z96652 Presence of left artificial knee joint: Secondary | ICD-10-CM | POA: Diagnosis not present

## 2019-09-06 DIAGNOSIS — R262 Difficulty in walking, not elsewhere classified: Secondary | ICD-10-CM | POA: Diagnosis not present

## 2019-09-06 DIAGNOSIS — M25562 Pain in left knee: Secondary | ICD-10-CM | POA: Diagnosis not present

## 2019-09-11 DIAGNOSIS — R262 Difficulty in walking, not elsewhere classified: Secondary | ICD-10-CM | POA: Diagnosis not present

## 2019-09-11 DIAGNOSIS — M25562 Pain in left knee: Secondary | ICD-10-CM | POA: Diagnosis not present

## 2019-09-11 DIAGNOSIS — Z96652 Presence of left artificial knee joint: Secondary | ICD-10-CM | POA: Diagnosis not present

## 2019-09-13 DIAGNOSIS — M25562 Pain in left knee: Secondary | ICD-10-CM | POA: Diagnosis not present

## 2019-09-13 DIAGNOSIS — R262 Difficulty in walking, not elsewhere classified: Secondary | ICD-10-CM | POA: Diagnosis not present

## 2019-09-13 DIAGNOSIS — Z96652 Presence of left artificial knee joint: Secondary | ICD-10-CM | POA: Diagnosis not present

## 2019-09-25 DIAGNOSIS — M542 Cervicalgia: Secondary | ICD-10-CM | POA: Diagnosis not present

## 2019-09-25 DIAGNOSIS — M545 Low back pain: Secondary | ICD-10-CM | POA: Diagnosis not present

## 2019-10-09 DIAGNOSIS — E039 Hypothyroidism, unspecified: Secondary | ICD-10-CM | POA: Diagnosis not present

## 2019-10-09 DIAGNOSIS — Z96651 Presence of right artificial knee joint: Secondary | ICD-10-CM | POA: Diagnosis not present

## 2019-10-09 DIAGNOSIS — Z9889 Other specified postprocedural states: Secondary | ICD-10-CM | POA: Diagnosis not present

## 2019-10-09 DIAGNOSIS — G629 Polyneuropathy, unspecified: Secondary | ICD-10-CM | POA: Diagnosis not present

## 2019-10-09 DIAGNOSIS — Z96652 Presence of left artificial knee joint: Secondary | ICD-10-CM | POA: Diagnosis not present

## 2019-10-09 DIAGNOSIS — M5136 Other intervertebral disc degeneration, lumbar region: Secondary | ICD-10-CM | POA: Diagnosis not present

## 2019-10-09 DIAGNOSIS — K5909 Other constipation: Secondary | ICD-10-CM | POA: Diagnosis not present

## 2019-10-09 DIAGNOSIS — G4701 Insomnia due to medical condition: Secondary | ICD-10-CM | POA: Diagnosis not present

## 2019-10-16 DIAGNOSIS — G8929 Other chronic pain: Secondary | ICD-10-CM | POA: Diagnosis not present

## 2019-10-18 DIAGNOSIS — Z4733 Aftercare following explantation of knee joint prosthesis: Secondary | ICD-10-CM | POA: Diagnosis not present

## 2019-10-18 DIAGNOSIS — Z881 Allergy status to other antibiotic agents status: Secondary | ICD-10-CM | POA: Diagnosis not present

## 2019-10-18 DIAGNOSIS — G629 Polyneuropathy, unspecified: Secondary | ICD-10-CM | POA: Diagnosis present

## 2019-10-18 DIAGNOSIS — G8918 Other acute postprocedural pain: Secondary | ICD-10-CM | POA: Diagnosis not present

## 2019-10-18 DIAGNOSIS — Z7982 Long term (current) use of aspirin: Secondary | ICD-10-CM | POA: Diagnosis not present

## 2019-10-18 DIAGNOSIS — Z79890 Hormone replacement therapy (postmenopausal): Secondary | ICD-10-CM | POA: Diagnosis not present

## 2019-10-18 DIAGNOSIS — Z880 Allergy status to penicillin: Secondary | ICD-10-CM | POA: Diagnosis not present

## 2019-10-18 DIAGNOSIS — Z79899 Other long term (current) drug therapy: Secondary | ICD-10-CM | POA: Diagnosis not present

## 2019-10-18 DIAGNOSIS — E039 Hypothyroidism, unspecified: Secondary | ICD-10-CM | POA: Diagnosis present

## 2019-10-18 DIAGNOSIS — M25562 Pain in left knee: Secondary | ICD-10-CM | POA: Diagnosis not present

## 2019-10-18 DIAGNOSIS — Z885 Allergy status to narcotic agent status: Secondary | ICD-10-CM | POA: Diagnosis not present

## 2019-10-18 DIAGNOSIS — Z471 Aftercare following joint replacement surgery: Secondary | ICD-10-CM | POA: Diagnosis not present

## 2019-10-18 DIAGNOSIS — Z87891 Personal history of nicotine dependence: Secondary | ICD-10-CM | POA: Diagnosis not present

## 2019-10-18 DIAGNOSIS — Z89522 Acquired absence of left knee: Secondary | ICD-10-CM | POA: Diagnosis not present

## 2019-10-18 DIAGNOSIS — Z96652 Presence of left artificial knee joint: Secondary | ICD-10-CM | POA: Diagnosis not present

## 2019-10-26 DIAGNOSIS — Z96652 Presence of left artificial knee joint: Secondary | ICD-10-CM | POA: Diagnosis not present

## 2019-11-02 DIAGNOSIS — Z96652 Presence of left artificial knee joint: Secondary | ICD-10-CM | POA: Diagnosis not present

## 2019-11-04 IMAGING — MG DIGITAL SCREENING BILATERAL MAMMOGRAM WITH IMPLANTS, CAD AND TOM
9 of 12 series · 9 of 28 positions shown · non-contrast
Comparison: Previous exam(s).

CLINICAL DATA: Screening.

EXAM:
DIGITAL SCREENING BILATERAL MAMMOGRAM WITH IMPLANTS, CAD AND TOMO
The patient has retroglandular implants. Standard and implant
displaced views were performed.

[L CC]
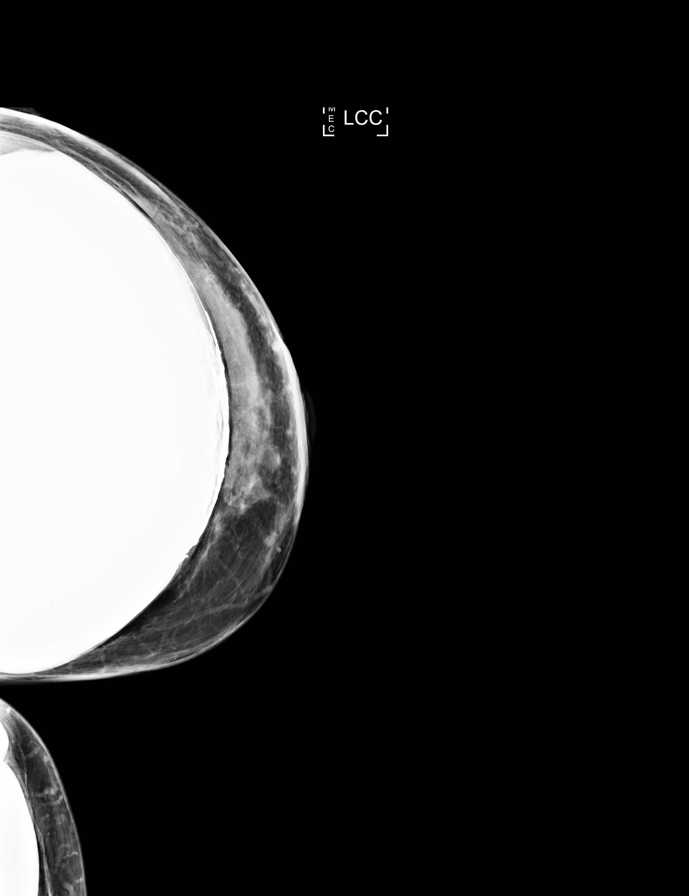

[R CC]
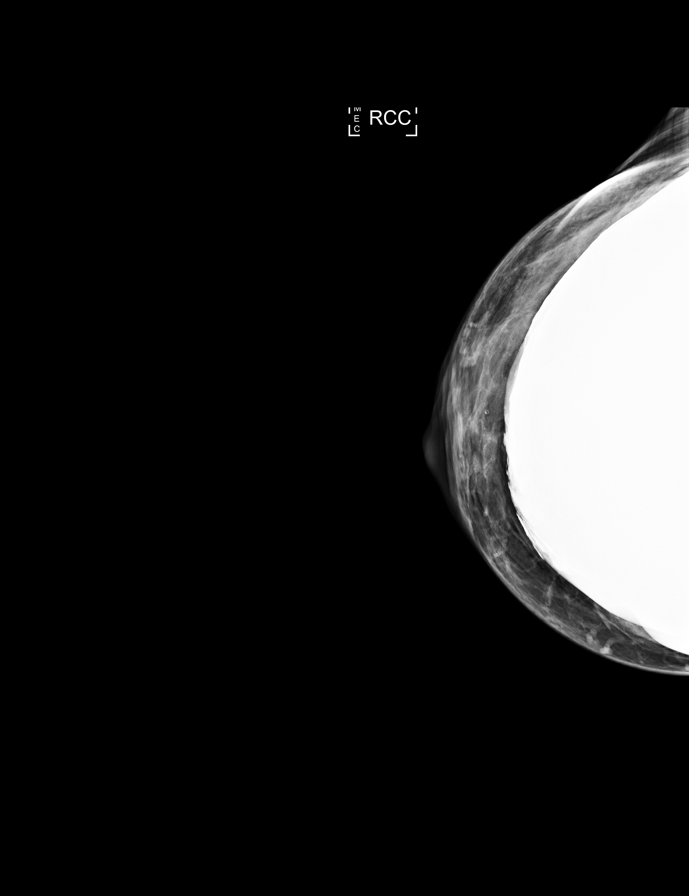

[L MLO]
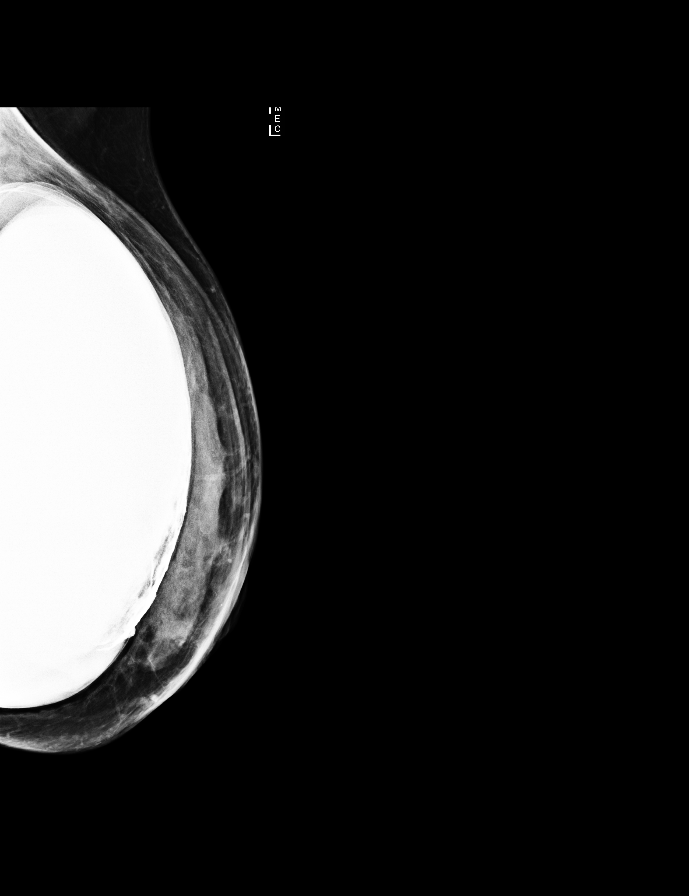

[R MLO]
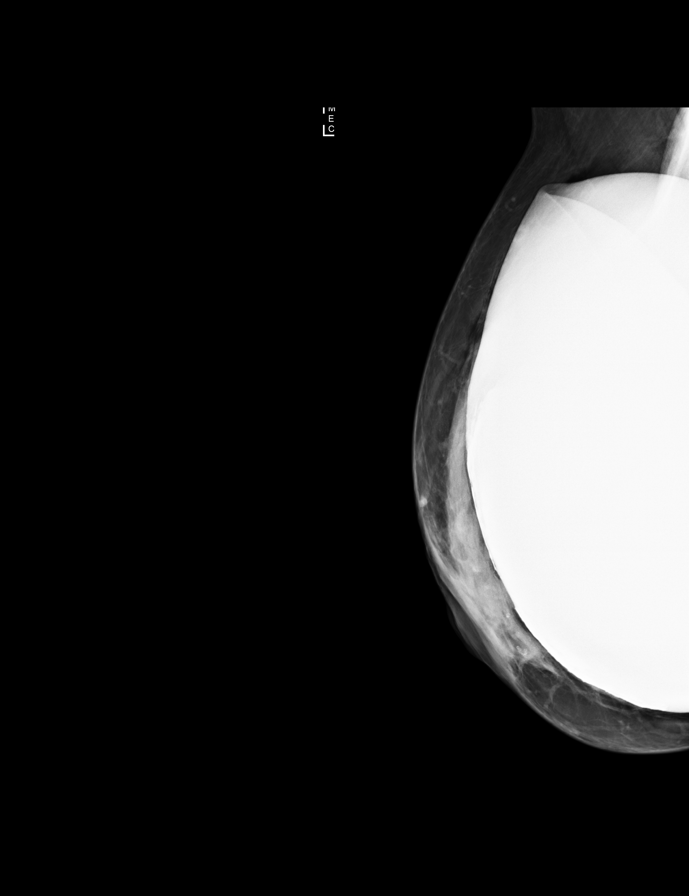

[R CC synth-2D]
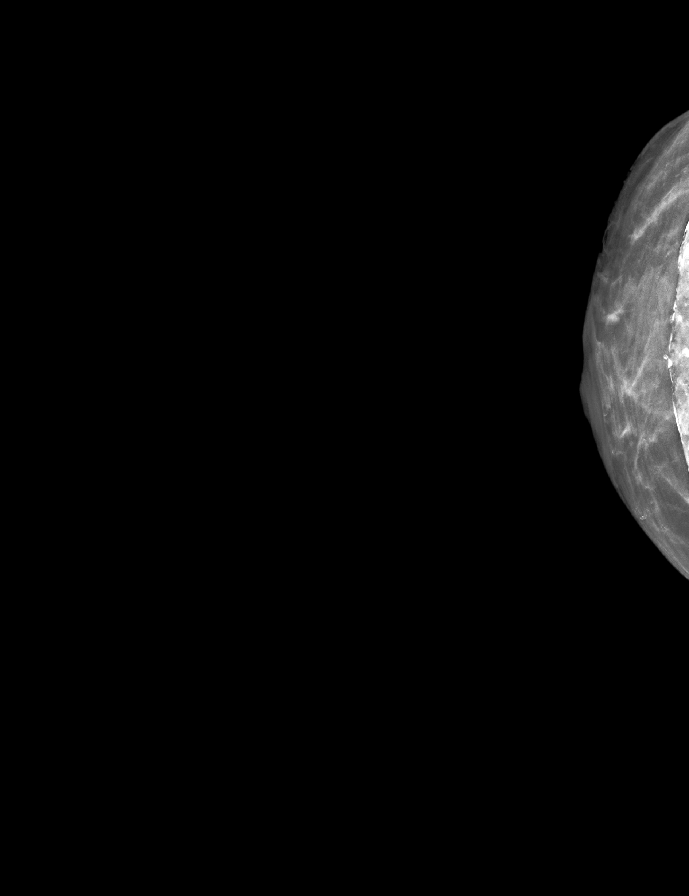

[L CC synth-2D]
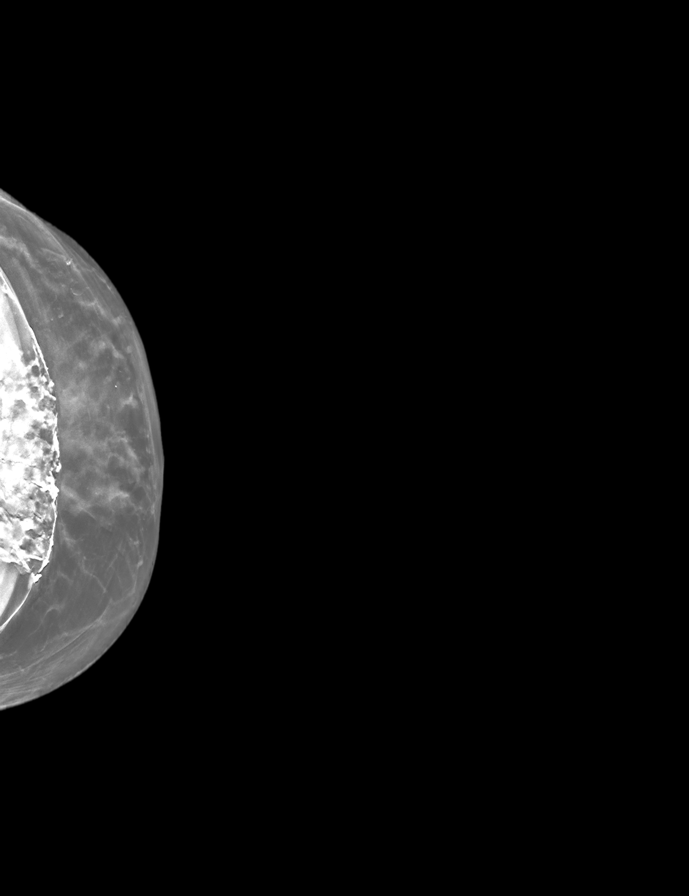

[L MLO synth-2D]
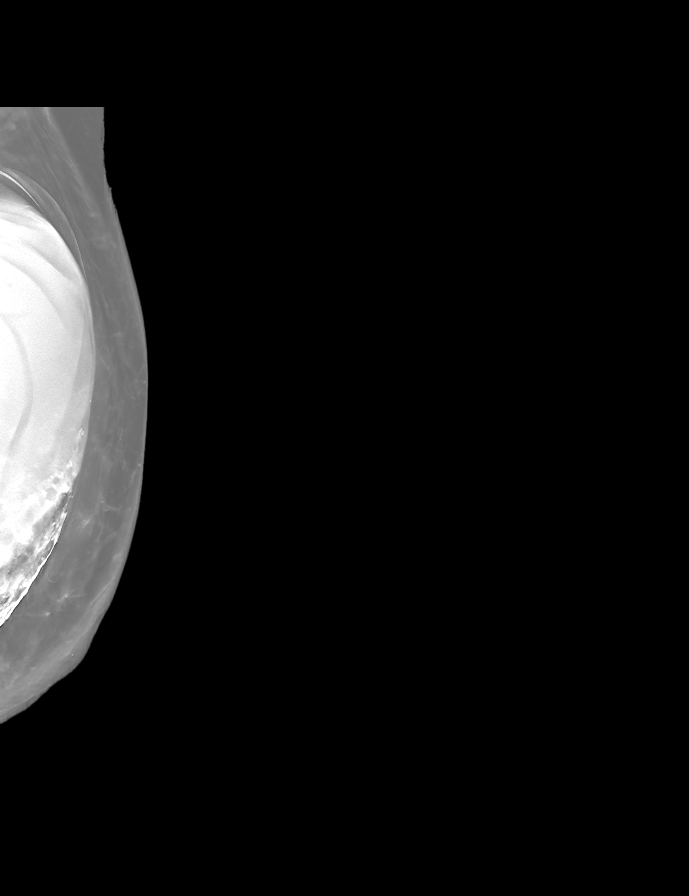

[R MLO synth-2D]
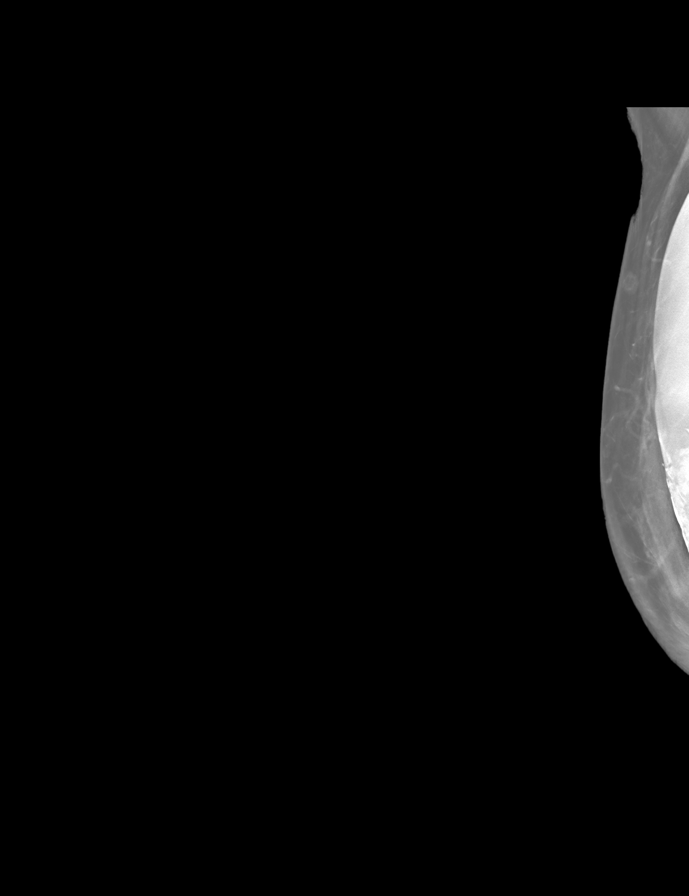

[R CCID BREAST TOMOSYNTHESIS IMAGE tomo · tomo slice 22/43.0]
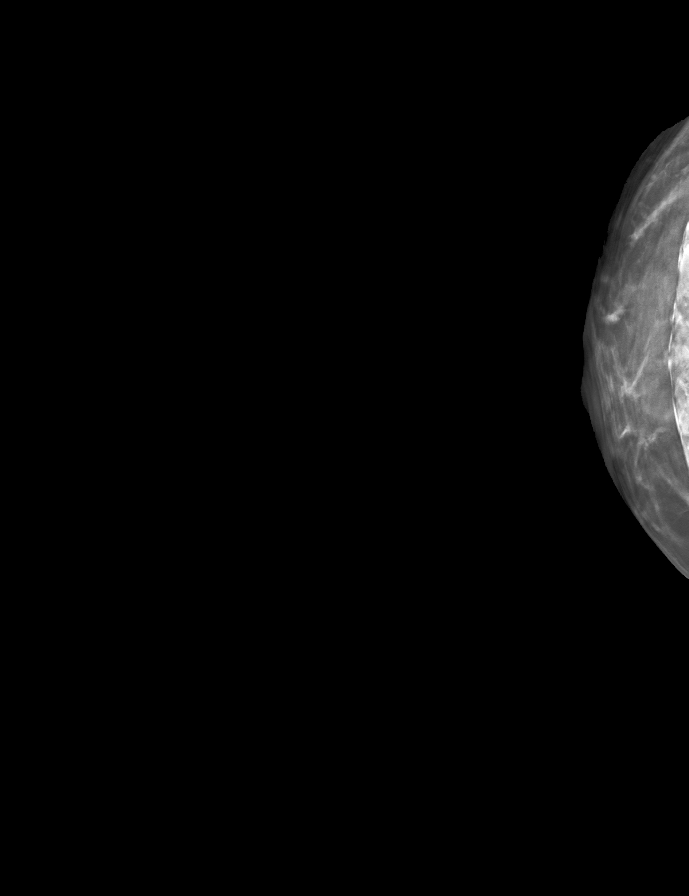

[9 of 28 positions shown; findings below may reference images not displayed]

ACR Breast Density Category d: The breast tissue is extremely dense,
which lowers the sensitivity of mammography.
FINDINGS: There are no findings suspicious for malignancy. Images were
processed with CAD.
IMPRESSION: No mammographic evidence of malignancy. A result letter of this
screening mammogram will be mailed directly to the patient.

RECOMMENDATION:
Screening mammogram in one year. (Code:I3-Q-JNP)

BI-RADS CATEGORY  1:  Negative.

## 2019-11-07 DIAGNOSIS — R262 Difficulty in walking, not elsewhere classified: Secondary | ICD-10-CM | POA: Diagnosis not present

## 2019-11-07 DIAGNOSIS — Z96652 Presence of left artificial knee joint: Secondary | ICD-10-CM | POA: Diagnosis not present

## 2019-11-07 DIAGNOSIS — Z471 Aftercare following joint replacement surgery: Secondary | ICD-10-CM | POA: Diagnosis not present

## 2019-11-07 DIAGNOSIS — M6281 Muscle weakness (generalized): Secondary | ICD-10-CM | POA: Diagnosis not present

## 2019-11-14 DIAGNOSIS — M6281 Muscle weakness (generalized): Secondary | ICD-10-CM | POA: Diagnosis not present

## 2019-11-14 DIAGNOSIS — R262 Difficulty in walking, not elsewhere classified: Secondary | ICD-10-CM | POA: Diagnosis not present

## 2019-11-14 DIAGNOSIS — Z471 Aftercare following joint replacement surgery: Secondary | ICD-10-CM | POA: Diagnosis not present

## 2019-11-14 DIAGNOSIS — Z96652 Presence of left artificial knee joint: Secondary | ICD-10-CM | POA: Diagnosis not present

## 2019-11-15 DIAGNOSIS — G8929 Other chronic pain: Secondary | ICD-10-CM | POA: Diagnosis not present

## 2019-11-16 DIAGNOSIS — M6281 Muscle weakness (generalized): Secondary | ICD-10-CM | POA: Diagnosis not present

## 2019-11-16 DIAGNOSIS — Z96652 Presence of left artificial knee joint: Secondary | ICD-10-CM | POA: Diagnosis not present

## 2019-11-16 DIAGNOSIS — R262 Difficulty in walking, not elsewhere classified: Secondary | ICD-10-CM | POA: Diagnosis not present

## 2019-11-16 DIAGNOSIS — Z471 Aftercare following joint replacement surgery: Secondary | ICD-10-CM | POA: Diagnosis not present

## 2019-11-20 DIAGNOSIS — Z96652 Presence of left artificial knee joint: Secondary | ICD-10-CM | POA: Diagnosis not present

## 2019-11-20 DIAGNOSIS — Z471 Aftercare following joint replacement surgery: Secondary | ICD-10-CM | POA: Diagnosis not present

## 2019-11-20 DIAGNOSIS — R262 Difficulty in walking, not elsewhere classified: Secondary | ICD-10-CM | POA: Diagnosis not present

## 2019-11-20 DIAGNOSIS — M6281 Muscle weakness (generalized): Secondary | ICD-10-CM | POA: Diagnosis not present

## 2019-11-27 DIAGNOSIS — R262 Difficulty in walking, not elsewhere classified: Secondary | ICD-10-CM | POA: Diagnosis not present

## 2019-11-27 DIAGNOSIS — Z96652 Presence of left artificial knee joint: Secondary | ICD-10-CM | POA: Diagnosis not present

## 2019-11-27 DIAGNOSIS — Z471 Aftercare following joint replacement surgery: Secondary | ICD-10-CM | POA: Diagnosis not present

## 2019-11-27 DIAGNOSIS — M6281 Muscle weakness (generalized): Secondary | ICD-10-CM | POA: Diagnosis not present

## 2019-11-28 DIAGNOSIS — Z96652 Presence of left artificial knee joint: Secondary | ICD-10-CM | POA: Diagnosis not present

## 2019-11-28 DIAGNOSIS — Z471 Aftercare following joint replacement surgery: Secondary | ICD-10-CM | POA: Diagnosis not present

## 2019-12-01 ENCOUNTER — Other Ambulatory Visit: Payer: Self-pay | Admitting: Internal Medicine

## 2019-12-03 DIAGNOSIS — Z96652 Presence of left artificial knee joint: Secondary | ICD-10-CM | POA: Diagnosis not present

## 2019-12-03 DIAGNOSIS — M6281 Muscle weakness (generalized): Secondary | ICD-10-CM | POA: Diagnosis not present

## 2019-12-03 DIAGNOSIS — Z471 Aftercare following joint replacement surgery: Secondary | ICD-10-CM | POA: Diagnosis not present

## 2019-12-03 DIAGNOSIS — R262 Difficulty in walking, not elsewhere classified: Secondary | ICD-10-CM | POA: Diagnosis not present

## 2019-12-05 DIAGNOSIS — Z471 Aftercare following joint replacement surgery: Secondary | ICD-10-CM | POA: Diagnosis not present

## 2019-12-05 DIAGNOSIS — R262 Difficulty in walking, not elsewhere classified: Secondary | ICD-10-CM | POA: Diagnosis not present

## 2019-12-05 DIAGNOSIS — Z96652 Presence of left artificial knee joint: Secondary | ICD-10-CM | POA: Diagnosis not present

## 2019-12-05 DIAGNOSIS — M6281 Muscle weakness (generalized): Secondary | ICD-10-CM | POA: Diagnosis not present

## 2019-12-10 DIAGNOSIS — Z96652 Presence of left artificial knee joint: Secondary | ICD-10-CM | POA: Diagnosis not present

## 2019-12-10 DIAGNOSIS — M6281 Muscle weakness (generalized): Secondary | ICD-10-CM | POA: Diagnosis not present

## 2019-12-10 DIAGNOSIS — R262 Difficulty in walking, not elsewhere classified: Secondary | ICD-10-CM | POA: Diagnosis not present

## 2019-12-10 DIAGNOSIS — Z471 Aftercare following joint replacement surgery: Secondary | ICD-10-CM | POA: Diagnosis not present

## 2019-12-12 DIAGNOSIS — G8929 Other chronic pain: Secondary | ICD-10-CM | POA: Diagnosis not present

## 2019-12-12 DIAGNOSIS — Z96652 Presence of left artificial knee joint: Secondary | ICD-10-CM | POA: Diagnosis not present

## 2019-12-12 DIAGNOSIS — M6281 Muscle weakness (generalized): Secondary | ICD-10-CM | POA: Diagnosis not present

## 2019-12-12 DIAGNOSIS — R262 Difficulty in walking, not elsewhere classified: Secondary | ICD-10-CM | POA: Diagnosis not present

## 2019-12-12 DIAGNOSIS — Z471 Aftercare following joint replacement surgery: Secondary | ICD-10-CM | POA: Diagnosis not present

## 2019-12-13 ENCOUNTER — Other Ambulatory Visit: Payer: Self-pay | Admitting: Internal Medicine

## 2019-12-17 DIAGNOSIS — R262 Difficulty in walking, not elsewhere classified: Secondary | ICD-10-CM | POA: Diagnosis not present

## 2019-12-17 DIAGNOSIS — Z471 Aftercare following joint replacement surgery: Secondary | ICD-10-CM | POA: Diagnosis not present

## 2019-12-17 DIAGNOSIS — R2689 Other abnormalities of gait and mobility: Secondary | ICD-10-CM | POA: Diagnosis not present

## 2019-12-17 DIAGNOSIS — M6281 Muscle weakness (generalized): Secondary | ICD-10-CM | POA: Diagnosis not present

## 2019-12-17 DIAGNOSIS — Z96652 Presence of left artificial knee joint: Secondary | ICD-10-CM | POA: Diagnosis not present

## 2019-12-18 DIAGNOSIS — Z13220 Encounter for screening for lipoid disorders: Secondary | ICD-10-CM | POA: Diagnosis not present

## 2019-12-18 DIAGNOSIS — M5136 Other intervertebral disc degeneration, lumbar region: Secondary | ICD-10-CM | POA: Diagnosis not present

## 2019-12-18 DIAGNOSIS — M2042 Other hammer toe(s) (acquired), left foot: Secondary | ICD-10-CM | POA: Diagnosis not present

## 2019-12-18 DIAGNOSIS — E039 Hypothyroidism, unspecified: Secondary | ICD-10-CM | POA: Diagnosis not present

## 2019-12-18 DIAGNOSIS — D649 Anemia, unspecified: Secondary | ICD-10-CM | POA: Diagnosis not present

## 2019-12-18 DIAGNOSIS — R5383 Other fatigue: Secondary | ICD-10-CM | POA: Diagnosis not present

## 2019-12-18 DIAGNOSIS — H579 Unspecified disorder of eye and adnexa: Secondary | ICD-10-CM | POA: Diagnosis not present

## 2019-12-18 DIAGNOSIS — E559 Vitamin D deficiency, unspecified: Secondary | ICD-10-CM | POA: Diagnosis not present

## 2019-12-19 DIAGNOSIS — M19022 Primary osteoarthritis, left elbow: Secondary | ICD-10-CM | POA: Diagnosis not present

## 2019-12-19 DIAGNOSIS — M25522 Pain in left elbow: Secondary | ICD-10-CM | POA: Diagnosis not present

## 2019-12-20 DIAGNOSIS — Z471 Aftercare following joint replacement surgery: Secondary | ICD-10-CM | POA: Diagnosis not present

## 2019-12-20 DIAGNOSIS — Z96652 Presence of left artificial knee joint: Secondary | ICD-10-CM | POA: Diagnosis not present

## 2019-12-20 DIAGNOSIS — R2689 Other abnormalities of gait and mobility: Secondary | ICD-10-CM | POA: Diagnosis not present

## 2019-12-20 DIAGNOSIS — R262 Difficulty in walking, not elsewhere classified: Secondary | ICD-10-CM | POA: Diagnosis not present

## 2019-12-20 DIAGNOSIS — M6281 Muscle weakness (generalized): Secondary | ICD-10-CM | POA: Diagnosis not present

## 2019-12-24 DIAGNOSIS — R262 Difficulty in walking, not elsewhere classified: Secondary | ICD-10-CM | POA: Diagnosis not present

## 2019-12-24 DIAGNOSIS — Z96652 Presence of left artificial knee joint: Secondary | ICD-10-CM | POA: Diagnosis not present

## 2019-12-24 DIAGNOSIS — R2689 Other abnormalities of gait and mobility: Secondary | ICD-10-CM | POA: Diagnosis not present

## 2019-12-24 DIAGNOSIS — M6281 Muscle weakness (generalized): Secondary | ICD-10-CM | POA: Diagnosis not present

## 2019-12-24 DIAGNOSIS — Z471 Aftercare following joint replacement surgery: Secondary | ICD-10-CM | POA: Diagnosis not present

## 2019-12-25 DIAGNOSIS — M779 Enthesopathy, unspecified: Secondary | ICD-10-CM | POA: Diagnosis not present

## 2019-12-25 DIAGNOSIS — M2012 Hallux valgus (acquired), left foot: Secondary | ICD-10-CM | POA: Diagnosis not present

## 2019-12-25 DIAGNOSIS — M2042 Other hammer toe(s) (acquired), left foot: Secondary | ICD-10-CM | POA: Diagnosis not present

## 2019-12-27 DIAGNOSIS — M6281 Muscle weakness (generalized): Secondary | ICD-10-CM | POA: Diagnosis not present

## 2019-12-27 DIAGNOSIS — R2689 Other abnormalities of gait and mobility: Secondary | ICD-10-CM | POA: Diagnosis not present

## 2019-12-27 DIAGNOSIS — Z96652 Presence of left artificial knee joint: Secondary | ICD-10-CM | POA: Diagnosis not present

## 2019-12-27 DIAGNOSIS — R262 Difficulty in walking, not elsewhere classified: Secondary | ICD-10-CM | POA: Diagnosis not present

## 2019-12-27 DIAGNOSIS — Z471 Aftercare following joint replacement surgery: Secondary | ICD-10-CM | POA: Diagnosis not present

## 2019-12-30 ENCOUNTER — Other Ambulatory Visit: Payer: Self-pay | Admitting: Internal Medicine

## 2019-12-31 ENCOUNTER — Other Ambulatory Visit: Payer: Self-pay | Admitting: Internal Medicine

## 2019-12-31 DIAGNOSIS — Z471 Aftercare following joint replacement surgery: Secondary | ICD-10-CM | POA: Diagnosis not present

## 2019-12-31 DIAGNOSIS — R2689 Other abnormalities of gait and mobility: Secondary | ICD-10-CM | POA: Diagnosis not present

## 2019-12-31 DIAGNOSIS — R262 Difficulty in walking, not elsewhere classified: Secondary | ICD-10-CM | POA: Diagnosis not present

## 2019-12-31 DIAGNOSIS — Z96652 Presence of left artificial knee joint: Secondary | ICD-10-CM | POA: Diagnosis not present

## 2019-12-31 DIAGNOSIS — M6281 Muscle weakness (generalized): Secondary | ICD-10-CM | POA: Diagnosis not present

## 2020-01-07 DIAGNOSIS — M6281 Muscle weakness (generalized): Secondary | ICD-10-CM | POA: Diagnosis not present

## 2020-01-07 DIAGNOSIS — Z471 Aftercare following joint replacement surgery: Secondary | ICD-10-CM | POA: Diagnosis not present

## 2020-01-07 DIAGNOSIS — Z96652 Presence of left artificial knee joint: Secondary | ICD-10-CM | POA: Diagnosis not present

## 2020-01-07 DIAGNOSIS — R2689 Other abnormalities of gait and mobility: Secondary | ICD-10-CM | POA: Diagnosis not present

## 2020-01-07 DIAGNOSIS — R262 Difficulty in walking, not elsewhere classified: Secondary | ICD-10-CM | POA: Diagnosis not present

## 2020-01-09 DIAGNOSIS — E559 Vitamin D deficiency, unspecified: Secondary | ICD-10-CM | POA: Diagnosis not present

## 2020-01-09 DIAGNOSIS — R5383 Other fatigue: Secondary | ICD-10-CM | POA: Diagnosis not present

## 2020-01-09 DIAGNOSIS — E785 Hyperlipidemia, unspecified: Secondary | ICD-10-CM | POA: Diagnosis not present

## 2020-01-09 DIAGNOSIS — Z13220 Encounter for screening for lipoid disorders: Secondary | ICD-10-CM | POA: Diagnosis not present

## 2020-01-09 DIAGNOSIS — D649 Anemia, unspecified: Secondary | ICD-10-CM | POA: Diagnosis not present

## 2020-01-10 ENCOUNTER — Other Ambulatory Visit: Payer: Self-pay | Admitting: Internal Medicine

## 2020-01-10 DIAGNOSIS — Z471 Aftercare following joint replacement surgery: Secondary | ICD-10-CM | POA: Diagnosis not present

## 2020-01-10 DIAGNOSIS — Z96652 Presence of left artificial knee joint: Secondary | ICD-10-CM | POA: Diagnosis not present

## 2020-01-10 DIAGNOSIS — M6281 Muscle weakness (generalized): Secondary | ICD-10-CM | POA: Diagnosis not present

## 2020-01-10 DIAGNOSIS — R2689 Other abnormalities of gait and mobility: Secondary | ICD-10-CM | POA: Diagnosis not present

## 2020-01-10 DIAGNOSIS — R262 Difficulty in walking, not elsewhere classified: Secondary | ICD-10-CM | POA: Diagnosis not present

## 2020-01-11 DIAGNOSIS — M545 Low back pain: Secondary | ICD-10-CM | POA: Diagnosis not present

## 2020-01-11 DIAGNOSIS — Z79891 Long term (current) use of opiate analgesic: Secondary | ICD-10-CM | POA: Diagnosis not present

## 2020-01-15 DIAGNOSIS — M2042 Other hammer toe(s) (acquired), left foot: Secondary | ICD-10-CM | POA: Diagnosis not present

## 2020-01-16 DIAGNOSIS — R2689 Other abnormalities of gait and mobility: Secondary | ICD-10-CM | POA: Diagnosis not present

## 2020-01-16 DIAGNOSIS — Z96652 Presence of left artificial knee joint: Secondary | ICD-10-CM | POA: Diagnosis not present

## 2020-01-16 DIAGNOSIS — Z471 Aftercare following joint replacement surgery: Secondary | ICD-10-CM | POA: Diagnosis not present

## 2020-01-16 DIAGNOSIS — R262 Difficulty in walking, not elsewhere classified: Secondary | ICD-10-CM | POA: Diagnosis not present

## 2020-01-16 DIAGNOSIS — M6281 Muscle weakness (generalized): Secondary | ICD-10-CM | POA: Diagnosis not present

## 2020-01-17 DIAGNOSIS — N3281 Overactive bladder: Secondary | ICD-10-CM | POA: Diagnosis not present

## 2020-01-17 DIAGNOSIS — J309 Allergic rhinitis, unspecified: Secondary | ICD-10-CM | POA: Diagnosis not present

## 2020-01-17 DIAGNOSIS — E559 Vitamin D deficiency, unspecified: Secondary | ICD-10-CM | POA: Diagnosis not present

## 2020-01-17 DIAGNOSIS — E7800 Pure hypercholesterolemia, unspecified: Secondary | ICD-10-CM | POA: Diagnosis not present

## 2020-01-18 DIAGNOSIS — Z471 Aftercare following joint replacement surgery: Secondary | ICD-10-CM | POA: Diagnosis not present

## 2020-01-18 DIAGNOSIS — Z96652 Presence of left artificial knee joint: Secondary | ICD-10-CM | POA: Diagnosis not present

## 2020-01-18 DIAGNOSIS — M6281 Muscle weakness (generalized): Secondary | ICD-10-CM | POA: Diagnosis not present

## 2020-01-18 DIAGNOSIS — R262 Difficulty in walking, not elsewhere classified: Secondary | ICD-10-CM | POA: Diagnosis not present

## 2020-01-18 DIAGNOSIS — R2689 Other abnormalities of gait and mobility: Secondary | ICD-10-CM | POA: Diagnosis not present

## 2020-01-22 DIAGNOSIS — R2689 Other abnormalities of gait and mobility: Secondary | ICD-10-CM | POA: Diagnosis not present

## 2020-01-22 DIAGNOSIS — Z471 Aftercare following joint replacement surgery: Secondary | ICD-10-CM | POA: Diagnosis not present

## 2020-01-22 DIAGNOSIS — Z96652 Presence of left artificial knee joint: Secondary | ICD-10-CM | POA: Diagnosis not present

## 2020-01-22 DIAGNOSIS — M6281 Muscle weakness (generalized): Secondary | ICD-10-CM | POA: Diagnosis not present

## 2020-01-22 DIAGNOSIS — R262 Difficulty in walking, not elsewhere classified: Secondary | ICD-10-CM | POA: Diagnosis not present

## 2020-01-24 DIAGNOSIS — Z471 Aftercare following joint replacement surgery: Secondary | ICD-10-CM | POA: Diagnosis not present

## 2020-01-24 DIAGNOSIS — Z96652 Presence of left artificial knee joint: Secondary | ICD-10-CM | POA: Diagnosis not present

## 2020-01-24 DIAGNOSIS — R262 Difficulty in walking, not elsewhere classified: Secondary | ICD-10-CM | POA: Diagnosis not present

## 2020-01-24 DIAGNOSIS — M6281 Muscle weakness (generalized): Secondary | ICD-10-CM | POA: Diagnosis not present

## 2020-01-24 DIAGNOSIS — R2689 Other abnormalities of gait and mobility: Secondary | ICD-10-CM | POA: Diagnosis not present

## 2020-01-28 DIAGNOSIS — H53001 Unspecified amblyopia, right eye: Secondary | ICD-10-CM | POA: Diagnosis not present

## 2020-01-28 DIAGNOSIS — H04123 Dry eye syndrome of bilateral lacrimal glands: Secondary | ICD-10-CM | POA: Diagnosis not present

## 2020-01-28 DIAGNOSIS — H26493 Other secondary cataract, bilateral: Secondary | ICD-10-CM | POA: Diagnosis not present

## 2020-01-28 DIAGNOSIS — H43812 Vitreous degeneration, left eye: Secondary | ICD-10-CM | POA: Diagnosis not present

## 2020-01-29 DIAGNOSIS — Z471 Aftercare following joint replacement surgery: Secondary | ICD-10-CM | POA: Diagnosis not present

## 2020-01-29 DIAGNOSIS — Z96652 Presence of left artificial knee joint: Secondary | ICD-10-CM | POA: Diagnosis not present

## 2020-01-29 DIAGNOSIS — R262 Difficulty in walking, not elsewhere classified: Secondary | ICD-10-CM | POA: Diagnosis not present

## 2020-01-29 DIAGNOSIS — M6281 Muscle weakness (generalized): Secondary | ICD-10-CM | POA: Diagnosis not present

## 2020-01-29 DIAGNOSIS — R2689 Other abnormalities of gait and mobility: Secondary | ICD-10-CM | POA: Diagnosis not present

## 2020-01-31 DIAGNOSIS — R262 Difficulty in walking, not elsewhere classified: Secondary | ICD-10-CM | POA: Diagnosis not present

## 2020-01-31 DIAGNOSIS — R2689 Other abnormalities of gait and mobility: Secondary | ICD-10-CM | POA: Diagnosis not present

## 2020-01-31 DIAGNOSIS — Z471 Aftercare following joint replacement surgery: Secondary | ICD-10-CM | POA: Diagnosis not present

## 2020-01-31 DIAGNOSIS — M6281 Muscle weakness (generalized): Secondary | ICD-10-CM | POA: Diagnosis not present

## 2020-01-31 DIAGNOSIS — Z96652 Presence of left artificial knee joint: Secondary | ICD-10-CM | POA: Diagnosis not present

## 2020-02-02 ENCOUNTER — Other Ambulatory Visit: Payer: Self-pay | Admitting: Internal Medicine

## 2020-02-04 DIAGNOSIS — Z471 Aftercare following joint replacement surgery: Secondary | ICD-10-CM | POA: Diagnosis not present

## 2020-02-04 DIAGNOSIS — R2689 Other abnormalities of gait and mobility: Secondary | ICD-10-CM | POA: Diagnosis not present

## 2020-02-04 DIAGNOSIS — Z96652 Presence of left artificial knee joint: Secondary | ICD-10-CM | POA: Diagnosis not present

## 2020-02-04 DIAGNOSIS — R262 Difficulty in walking, not elsewhere classified: Secondary | ICD-10-CM | POA: Diagnosis not present

## 2020-02-04 DIAGNOSIS — M6281 Muscle weakness (generalized): Secondary | ICD-10-CM | POA: Diagnosis not present

## 2020-02-08 DIAGNOSIS — G8929 Other chronic pain: Secondary | ICD-10-CM | POA: Diagnosis not present

## 2020-02-11 DIAGNOSIS — R262 Difficulty in walking, not elsewhere classified: Secondary | ICD-10-CM | POA: Diagnosis not present

## 2020-02-11 DIAGNOSIS — R2689 Other abnormalities of gait and mobility: Secondary | ICD-10-CM | POA: Diagnosis not present

## 2020-02-11 DIAGNOSIS — M6281 Muscle weakness (generalized): Secondary | ICD-10-CM | POA: Diagnosis not present

## 2020-02-11 DIAGNOSIS — Z471 Aftercare following joint replacement surgery: Secondary | ICD-10-CM | POA: Diagnosis not present

## 2020-02-11 DIAGNOSIS — Z96652 Presence of left artificial knee joint: Secondary | ICD-10-CM | POA: Diagnosis not present

## 2020-02-14 DIAGNOSIS — M6281 Muscle weakness (generalized): Secondary | ICD-10-CM | POA: Diagnosis not present

## 2020-02-14 DIAGNOSIS — Z96652 Presence of left artificial knee joint: Secondary | ICD-10-CM | POA: Diagnosis not present

## 2020-02-14 DIAGNOSIS — R262 Difficulty in walking, not elsewhere classified: Secondary | ICD-10-CM | POA: Diagnosis not present

## 2020-02-14 DIAGNOSIS — R2689 Other abnormalities of gait and mobility: Secondary | ICD-10-CM | POA: Diagnosis not present

## 2020-02-14 DIAGNOSIS — Z471 Aftercare following joint replacement surgery: Secondary | ICD-10-CM | POA: Diagnosis not present

## 2020-02-27 ENCOUNTER — Other Ambulatory Visit: Payer: Self-pay | Admitting: Internal Medicine

## 2020-02-28 ENCOUNTER — Other Ambulatory Visit: Payer: Self-pay | Admitting: Internal Medicine

## 2020-03-17 DIAGNOSIS — M546 Pain in thoracic spine: Secondary | ICD-10-CM | POA: Diagnosis not present

## 2020-03-17 DIAGNOSIS — M545 Low back pain: Secondary | ICD-10-CM | POA: Diagnosis not present

## 2020-04-01 ENCOUNTER — Other Ambulatory Visit: Payer: Self-pay | Admitting: Internal Medicine

## 2020-04-15 DIAGNOSIS — M259 Joint disorder, unspecified: Secondary | ICD-10-CM | POA: Diagnosis not present

## 2020-04-15 DIAGNOSIS — M47892 Other spondylosis, cervical region: Secondary | ICD-10-CM | POA: Diagnosis not present

## 2020-04-15 DIAGNOSIS — S199XXA Unspecified injury of neck, initial encounter: Secondary | ICD-10-CM | POA: Diagnosis not present

## 2020-04-15 DIAGNOSIS — R52 Pain, unspecified: Secondary | ICD-10-CM | POA: Diagnosis not present

## 2020-04-15 DIAGNOSIS — M47812 Spondylosis without myelopathy or radiculopathy, cervical region: Secondary | ICD-10-CM | POA: Diagnosis not present

## 2020-04-15 DIAGNOSIS — S0181XA Laceration without foreign body of other part of head, initial encounter: Secondary | ICD-10-CM | POA: Diagnosis not present

## 2020-04-15 DIAGNOSIS — W19XXXA Unspecified fall, initial encounter: Secondary | ICD-10-CM | POA: Diagnosis not present

## 2020-04-15 DIAGNOSIS — Z87891 Personal history of nicotine dependence: Secondary | ICD-10-CM | POA: Diagnosis not present

## 2020-04-15 DIAGNOSIS — S0993XA Unspecified injury of face, initial encounter: Secondary | ICD-10-CM | POA: Diagnosis not present

## 2020-04-15 DIAGNOSIS — S0990XA Unspecified injury of head, initial encounter: Secondary | ICD-10-CM | POA: Diagnosis not present

## 2020-04-15 DIAGNOSIS — M459 Ankylosing spondylitis of unspecified sites in spine: Secondary | ICD-10-CM | POA: Diagnosis not present

## 2020-04-15 DIAGNOSIS — I959 Hypotension, unspecified: Secondary | ICD-10-CM | POA: Diagnosis not present

## 2020-04-15 DIAGNOSIS — R0902 Hypoxemia: Secondary | ICD-10-CM | POA: Diagnosis not present

## 2020-04-18 DIAGNOSIS — I8312 Varicose veins of left lower extremity with inflammation: Secondary | ICD-10-CM | POA: Diagnosis not present

## 2020-04-18 DIAGNOSIS — I8311 Varicose veins of right lower extremity with inflammation: Secondary | ICD-10-CM | POA: Diagnosis not present

## 2020-04-22 DIAGNOSIS — Z885 Allergy status to narcotic agent status: Secondary | ICD-10-CM | POA: Diagnosis not present

## 2020-04-22 DIAGNOSIS — I959 Hypotension, unspecified: Secondary | ICD-10-CM | POA: Diagnosis not present

## 2020-04-22 DIAGNOSIS — R0602 Shortness of breath: Secondary | ICD-10-CM | POA: Diagnosis not present

## 2020-04-22 DIAGNOSIS — Z1239 Encounter for other screening for malignant neoplasm of breast: Secondary | ICD-10-CM | POA: Diagnosis not present

## 2020-04-22 DIAGNOSIS — E039 Hypothyroidism, unspecified: Secondary | ICD-10-CM | POA: Diagnosis not present

## 2020-04-22 DIAGNOSIS — Z888 Allergy status to other drugs, medicaments and biological substances status: Secondary | ICD-10-CM | POA: Diagnosis not present

## 2020-04-22 DIAGNOSIS — Z881 Allergy status to other antibiotic agents status: Secondary | ICD-10-CM | POA: Diagnosis not present

## 2020-04-30 DIAGNOSIS — G8929 Other chronic pain: Secondary | ICD-10-CM | POA: Diagnosis not present

## 2020-04-30 DIAGNOSIS — M47812 Spondylosis without myelopathy or radiculopathy, cervical region: Secondary | ICD-10-CM | POA: Diagnosis not present

## 2020-05-08 DIAGNOSIS — Z1231 Encounter for screening mammogram for malignant neoplasm of breast: Secondary | ICD-10-CM | POA: Diagnosis not present

## 2020-05-09 DIAGNOSIS — Z96652 Presence of left artificial knee joint: Secondary | ICD-10-CM | POA: Diagnosis not present

## 2020-05-13 DIAGNOSIS — H43813 Vitreous degeneration, bilateral: Secondary | ICD-10-CM | POA: Diagnosis not present

## 2020-05-14 DIAGNOSIS — M47812 Spondylosis without myelopathy or radiculopathy, cervical region: Secondary | ICD-10-CM | POA: Diagnosis not present

## 2020-05-14 DIAGNOSIS — G8929 Other chronic pain: Secondary | ICD-10-CM | POA: Diagnosis not present

## 2020-05-21 DIAGNOSIS — Z23 Encounter for immunization: Secondary | ICD-10-CM | POA: Diagnosis not present

## 2020-05-27 DIAGNOSIS — M47892 Other spondylosis, cervical region: Secondary | ICD-10-CM | POA: Diagnosis not present

## 2020-05-28 DIAGNOSIS — I8312 Varicose veins of left lower extremity with inflammation: Secondary | ICD-10-CM | POA: Diagnosis not present

## 2020-05-28 DIAGNOSIS — I8311 Varicose veins of right lower extremity with inflammation: Secondary | ICD-10-CM | POA: Diagnosis not present

## 2020-06-02 DIAGNOSIS — M542 Cervicalgia: Secondary | ICD-10-CM | POA: Diagnosis not present

## 2020-06-10 DIAGNOSIS — M47812 Spondylosis without myelopathy or radiculopathy, cervical region: Secondary | ICD-10-CM | POA: Diagnosis not present

## 2020-06-23 DIAGNOSIS — M7742 Metatarsalgia, left foot: Secondary | ICD-10-CM | POA: Diagnosis not present

## 2020-06-23 DIAGNOSIS — M2042 Other hammer toe(s) (acquired), left foot: Secondary | ICD-10-CM | POA: Diagnosis not present

## 2020-06-25 DIAGNOSIS — I8312 Varicose veins of left lower extremity with inflammation: Secondary | ICD-10-CM | POA: Diagnosis not present

## 2020-06-25 DIAGNOSIS — G8929 Other chronic pain: Secondary | ICD-10-CM | POA: Diagnosis not present

## 2020-06-25 DIAGNOSIS — M47812 Spondylosis without myelopathy or radiculopathy, cervical region: Secondary | ICD-10-CM | POA: Diagnosis not present

## 2020-07-02 DIAGNOSIS — I8312 Varicose veins of left lower extremity with inflammation: Secondary | ICD-10-CM | POA: Diagnosis not present

## 2020-07-02 DIAGNOSIS — I8311 Varicose veins of right lower extremity with inflammation: Secondary | ICD-10-CM | POA: Diagnosis not present

## 2020-07-03 DIAGNOSIS — I8312 Varicose veins of left lower extremity with inflammation: Secondary | ICD-10-CM | POA: Diagnosis not present

## 2020-07-04 DIAGNOSIS — I8312 Varicose veins of left lower extremity with inflammation: Secondary | ICD-10-CM | POA: Diagnosis not present

## 2020-07-04 DIAGNOSIS — I8311 Varicose veins of right lower extremity with inflammation: Secondary | ICD-10-CM | POA: Diagnosis not present

## 2020-07-09 DIAGNOSIS — I8312 Varicose veins of left lower extremity with inflammation: Secondary | ICD-10-CM | POA: Diagnosis not present

## 2020-07-09 DIAGNOSIS — I8311 Varicose veins of right lower extremity with inflammation: Secondary | ICD-10-CM | POA: Diagnosis not present

## 2020-07-10 DIAGNOSIS — M47812 Spondylosis without myelopathy or radiculopathy, cervical region: Secondary | ICD-10-CM | POA: Diagnosis not present

## 2020-07-10 DIAGNOSIS — G8929 Other chronic pain: Secondary | ICD-10-CM | POA: Diagnosis not present

## 2020-07-16 DIAGNOSIS — I8311 Varicose veins of right lower extremity with inflammation: Secondary | ICD-10-CM | POA: Diagnosis not present

## 2020-07-21 DIAGNOSIS — I8311 Varicose veins of right lower extremity with inflammation: Secondary | ICD-10-CM | POA: Diagnosis not present

## 2020-07-24 DIAGNOSIS — M13821 Other specified arthritis, right elbow: Secondary | ICD-10-CM | POA: Diagnosis not present

## 2020-07-24 DIAGNOSIS — G8929 Other chronic pain: Secondary | ICD-10-CM | POA: Diagnosis not present

## 2020-07-24 DIAGNOSIS — M459 Ankylosing spondylitis of unspecified sites in spine: Secondary | ICD-10-CM | POA: Diagnosis not present

## 2020-07-24 DIAGNOSIS — M25521 Pain in right elbow: Secondary | ICD-10-CM | POA: Diagnosis not present

## 2020-07-24 DIAGNOSIS — M25522 Pain in left elbow: Secondary | ICD-10-CM | POA: Diagnosis not present

## 2020-08-06 DIAGNOSIS — I8312 Varicose veins of left lower extremity with inflammation: Secondary | ICD-10-CM | POA: Diagnosis not present

## 2020-08-11 DIAGNOSIS — Z13220 Encounter for screening for lipoid disorders: Secondary | ICD-10-CM | POA: Diagnosis not present

## 2020-08-11 DIAGNOSIS — R5383 Other fatigue: Secondary | ICD-10-CM | POA: Diagnosis not present

## 2020-08-11 DIAGNOSIS — E559 Vitamin D deficiency, unspecified: Secondary | ICD-10-CM | POA: Diagnosis not present

## 2020-08-11 DIAGNOSIS — D649 Anemia, unspecified: Secondary | ICD-10-CM | POA: Diagnosis not present

## 2020-08-13 DIAGNOSIS — I8311 Varicose veins of right lower extremity with inflammation: Secondary | ICD-10-CM | POA: Diagnosis not present

## 2020-08-14 DIAGNOSIS — M5481 Occipital neuralgia: Secondary | ICD-10-CM | POA: Diagnosis not present

## 2020-08-15 DIAGNOSIS — M7918 Myalgia, other site: Secondary | ICD-10-CM | POA: Diagnosis not present

## 2020-08-20 DIAGNOSIS — M130 Polyarthritis, unspecified: Secondary | ICD-10-CM | POA: Diagnosis not present

## 2020-08-20 DIAGNOSIS — E039 Hypothyroidism, unspecified: Secondary | ICD-10-CM | POA: Diagnosis not present

## 2020-08-20 DIAGNOSIS — E559 Vitamin D deficiency, unspecified: Secondary | ICD-10-CM | POA: Diagnosis not present

## 2020-08-20 DIAGNOSIS — I8312 Varicose veins of left lower extremity with inflammation: Secondary | ICD-10-CM | POA: Diagnosis not present

## 2020-08-20 DIAGNOSIS — E7800 Pure hypercholesterolemia, unspecified: Secondary | ICD-10-CM | POA: Diagnosis not present

## 2020-08-20 DIAGNOSIS — R0981 Nasal congestion: Secondary | ICD-10-CM | POA: Diagnosis not present

## 2020-08-20 DIAGNOSIS — R29818 Other symptoms and signs involving the nervous system: Secondary | ICD-10-CM | POA: Diagnosis not present

## 2020-08-20 DIAGNOSIS — N3281 Overactive bladder: Secondary | ICD-10-CM | POA: Diagnosis not present

## 2020-08-20 DIAGNOSIS — D649 Anemia, unspecified: Secondary | ICD-10-CM | POA: Diagnosis not present

## 2020-08-25 DIAGNOSIS — M7918 Myalgia, other site: Secondary | ICD-10-CM | POA: Diagnosis not present

## 2020-09-03 DIAGNOSIS — R29818 Other symptoms and signs involving the nervous system: Secondary | ICD-10-CM | POA: Diagnosis not present

## 2020-09-03 DIAGNOSIS — R531 Weakness: Secondary | ICD-10-CM | POA: Diagnosis not present

## 2020-09-03 DIAGNOSIS — M5411 Radiculopathy, occipito-atlanto-axial region: Secondary | ICD-10-CM | POA: Diagnosis not present

## 2020-09-03 DIAGNOSIS — R293 Abnormal posture: Secondary | ICD-10-CM | POA: Diagnosis not present

## 2020-09-03 DIAGNOSIS — M5481 Occipital neuralgia: Secondary | ICD-10-CM | POA: Diagnosis not present

## 2020-09-03 DIAGNOSIS — R2689 Other abnormalities of gait and mobility: Secondary | ICD-10-CM | POA: Diagnosis not present

## 2020-09-08 DIAGNOSIS — R29818 Other symptoms and signs involving the nervous system: Secondary | ICD-10-CM | POA: Diagnosis not present

## 2020-09-08 DIAGNOSIS — R293 Abnormal posture: Secondary | ICD-10-CM | POA: Diagnosis not present

## 2020-09-08 DIAGNOSIS — R0981 Nasal congestion: Secondary | ICD-10-CM | POA: Diagnosis not present

## 2020-09-08 DIAGNOSIS — R531 Weakness: Secondary | ICD-10-CM | POA: Diagnosis not present

## 2020-09-08 DIAGNOSIS — J329 Chronic sinusitis, unspecified: Secondary | ICD-10-CM | POA: Diagnosis not present

## 2020-09-08 DIAGNOSIS — R2689 Other abnormalities of gait and mobility: Secondary | ICD-10-CM | POA: Diagnosis not present

## 2020-09-08 DIAGNOSIS — J343 Hypertrophy of nasal turbinates: Secondary | ICD-10-CM | POA: Diagnosis not present

## 2020-09-08 DIAGNOSIS — J342 Deviated nasal septum: Secondary | ICD-10-CM | POA: Diagnosis not present

## 2020-09-10 DIAGNOSIS — R29818 Other symptoms and signs involving the nervous system: Secondary | ICD-10-CM | POA: Diagnosis not present

## 2020-09-10 DIAGNOSIS — R2689 Other abnormalities of gait and mobility: Secondary | ICD-10-CM | POA: Diagnosis not present

## 2020-09-10 DIAGNOSIS — R531 Weakness: Secondary | ICD-10-CM | POA: Diagnosis not present

## 2020-09-10 DIAGNOSIS — R293 Abnormal posture: Secondary | ICD-10-CM | POA: Diagnosis not present

## 2020-09-12 DIAGNOSIS — R2689 Other abnormalities of gait and mobility: Secondary | ICD-10-CM | POA: Diagnosis not present

## 2020-09-12 DIAGNOSIS — R293 Abnormal posture: Secondary | ICD-10-CM | POA: Diagnosis not present

## 2020-09-12 DIAGNOSIS — R531 Weakness: Secondary | ICD-10-CM | POA: Diagnosis not present

## 2020-09-12 DIAGNOSIS — R29818 Other symptoms and signs involving the nervous system: Secondary | ICD-10-CM | POA: Diagnosis not present

## 2020-09-15 DIAGNOSIS — R293 Abnormal posture: Secondary | ICD-10-CM | POA: Diagnosis not present

## 2020-09-15 DIAGNOSIS — R29818 Other symptoms and signs involving the nervous system: Secondary | ICD-10-CM | POA: Diagnosis not present

## 2020-09-15 DIAGNOSIS — R531 Weakness: Secondary | ICD-10-CM | POA: Diagnosis not present

## 2020-09-15 DIAGNOSIS — R2689 Other abnormalities of gait and mobility: Secondary | ICD-10-CM | POA: Diagnosis not present

## 2020-09-17 DIAGNOSIS — I8311 Varicose veins of right lower extremity with inflammation: Secondary | ICD-10-CM | POA: Diagnosis not present

## 2020-09-18 DIAGNOSIS — R2689 Other abnormalities of gait and mobility: Secondary | ICD-10-CM | POA: Diagnosis not present

## 2020-09-18 DIAGNOSIS — R531 Weakness: Secondary | ICD-10-CM | POA: Diagnosis not present

## 2020-09-18 DIAGNOSIS — R293 Abnormal posture: Secondary | ICD-10-CM | POA: Diagnosis not present

## 2020-09-18 DIAGNOSIS — R29818 Other symptoms and signs involving the nervous system: Secondary | ICD-10-CM | POA: Diagnosis not present

## 2020-09-19 DIAGNOSIS — M5481 Occipital neuralgia: Secondary | ICD-10-CM | POA: Diagnosis not present

## 2020-09-19 DIAGNOSIS — M5411 Radiculopathy, occipito-atlanto-axial region: Secondary | ICD-10-CM | POA: Diagnosis not present

## 2020-09-24 DIAGNOSIS — R293 Abnormal posture: Secondary | ICD-10-CM | POA: Diagnosis not present

## 2020-09-24 DIAGNOSIS — R2689 Other abnormalities of gait and mobility: Secondary | ICD-10-CM | POA: Diagnosis not present

## 2020-09-24 DIAGNOSIS — R29818 Other symptoms and signs involving the nervous system: Secondary | ICD-10-CM | POA: Diagnosis not present

## 2020-09-24 DIAGNOSIS — R531 Weakness: Secondary | ICD-10-CM | POA: Diagnosis not present

## 2020-09-26 DIAGNOSIS — G4489 Other headache syndrome: Secondary | ICD-10-CM | POA: Diagnosis not present

## 2020-09-26 DIAGNOSIS — G43009 Migraine without aura, not intractable, without status migrainosus: Secondary | ICD-10-CM | POA: Diagnosis not present

## 2020-10-02 DIAGNOSIS — J329 Chronic sinusitis, unspecified: Secondary | ICD-10-CM | POA: Diagnosis not present
